# Patient Record
Sex: Male | Born: 1945 | Race: Black or African American | Hispanic: No | Marital: Married | Smoking: Former smoker
Health system: Southern US, Community
[De-identification: ages and names within clinical notes are randomized; demographics above are authoritative.]

## PROBLEM LIST (undated history)

## (undated) ENCOUNTER — Emergency Department (HOSPITAL_COMMUNITY): Discharge status: Absent without leave | Payer: Medicare Other

## (undated) DIAGNOSIS — D509 Iron deficiency anemia, unspecified: Secondary | ICD-10-CM

## (undated) DIAGNOSIS — R972 Elevated prostate specific antigen [PSA]: Secondary | ICD-10-CM

## (undated) DIAGNOSIS — E785 Hyperlipidemia, unspecified: Secondary | ICD-10-CM

## (undated) DIAGNOSIS — Z974 Presence of external hearing-aid: Secondary | ICD-10-CM

## (undated) DIAGNOSIS — G4733 Obstructive sleep apnea (adult) (pediatric): Secondary | ICD-10-CM

## (undated) DIAGNOSIS — Z978 Presence of other specified devices: Secondary | ICD-10-CM

## (undated) DIAGNOSIS — Z973 Presence of spectacles and contact lenses: Secondary | ICD-10-CM

## (undated) DIAGNOSIS — Z9989 Dependence on other enabling machines and devices: Secondary | ICD-10-CM

## (undated) DIAGNOSIS — N401 Enlarged prostate with lower urinary tract symptoms: Secondary | ICD-10-CM

## (undated) DIAGNOSIS — K5909 Other constipation: Secondary | ICD-10-CM

## (undated) DIAGNOSIS — Z87438 Personal history of other diseases of male genital organs: Secondary | ICD-10-CM

## (undated) DIAGNOSIS — R310 Gross hematuria: Secondary | ICD-10-CM

## (undated) DIAGNOSIS — H04123 Dry eye syndrome of bilateral lacrimal glands: Secondary | ICD-10-CM

## (undated) DIAGNOSIS — J45909 Unspecified asthma, uncomplicated: Secondary | ICD-10-CM

## (undated) DIAGNOSIS — K219 Gastro-esophageal reflux disease without esophagitis: Secondary | ICD-10-CM

## (undated) DIAGNOSIS — I10 Essential (primary) hypertension: Secondary | ICD-10-CM

## (undated) HISTORY — PX: KNEE ARTHROSCOPY W/ MENISCAL REPAIR: SHX1877

## (undated) HISTORY — DX: Hyperlipidemia, unspecified: E78.5

## (undated) HISTORY — DX: Essential (primary) hypertension: I10

## (undated) HISTORY — DX: Gastro-esophageal reflux disease without esophagitis: K21.9

## (undated) HISTORY — PX: PROSTATE SURGERY: SHX751

## (undated) HISTORY — DX: Unspecified asthma, uncomplicated: J45.909

---

## 2007-01-04 ENCOUNTER — Ambulatory Visit: Payer: Self-pay | Admitting: Gastroenterology

## 2007-10-01 ENCOUNTER — Ambulatory Visit: Payer: Self-pay | Admitting: Internal Medicine

## 2008-05-27 HISTORY — PX: ANKLE ARTHRODESIS: SUR49

## 2010-05-13 ENCOUNTER — Ambulatory Visit: Payer: Self-pay | Admitting: Oncology

## 2010-05-18 LAB — CBC WITH DIFFERENTIAL (CANCER CENTER ONLY)
BASO#: 0 10*3/uL (ref 0.0–0.2)
EOS%: 4.3 % (ref 0.0–7.0)
HCT: 30 % — ABNORMAL LOW (ref 38.7–49.9)
HGB: 10.1 g/dL — ABNORMAL LOW (ref 13.0–17.1)
MCH: 27.6 pg — ABNORMAL LOW (ref 28.0–33.4)
MCHC: 33.5 g/dL (ref 32.0–35.9)
MONO%: 9.6 % (ref 0.0–13.0)
NEUT%: 50.4 % (ref 40.0–80.0)
Platelets: 283 10*3/uL (ref 145–400)
RBC: 3.65 10*6/uL — ABNORMAL LOW (ref 4.20–5.70)
RDW: 13.4 % (ref 10.5–14.6)
WBC: 4 10*3/uL (ref 4.0–10.0)

## 2010-05-18 LAB — CMP (CANCER CENTER ONLY)
Calcium: 9.5 mg/dL (ref 8.0–10.3)
Sodium: 137 mEq/L (ref 128–145)
Total Protein: 7.2 g/dL (ref 6.4–8.1)

## 2010-05-18 LAB — MORPHOLOGY - CHCC SATELLITE

## 2010-05-24 LAB — PROTEIN ELECTROPHORESIS, SERUM
Albumin ELP: 56.5 % (ref 55.8–66.1)
Alpha-1-Globulin: 4 % (ref 2.9–4.9)
Alpha-2-Globulin: 7.8 % (ref 7.1–11.8)
Beta 2: 5.9 % (ref 3.2–6.5)
Beta Globulin: 6.6 % (ref 4.7–7.2)
Gamma Globulin: 19.2 % — ABNORMAL HIGH (ref 11.1–18.8)

## 2010-05-24 LAB — HAPTOGLOBIN: Haptoglobin: 141 mg/dL (ref 16–200)

## 2010-05-24 LAB — RETICULOCYTES (CHCC)
ABS Retic: 96.2 10*3/uL (ref 19.0–186.0)
Retic Ct Pct: 2.6 % (ref 0.4–3.1)

## 2010-05-24 LAB — HEMOGLOBINOPATHY EVALUATION
Hgb A2 Quant: 2.3 % (ref 2.2–3.2)
Hgb A: 97.7 % (ref 96.8–97.8)

## 2010-05-24 LAB — FOLATE: Folate: >20 ng/mL

## 2010-05-24 LAB — ERYTHROPOIETIN: Erythropoietin: 65.6 m[IU]/mL — ABNORMAL HIGH (ref 2.6–34.0)

## 2010-05-24 LAB — IRON AND TIBC
%SAT: 80 % — ABNORMAL HIGH (ref 20–55)
Iron: 330 ug/dL — ABNORMAL HIGH (ref 42–165)
TIBC: 415 ug/dL (ref 215–435)
UIBC: 85 ug/dL

## 2010-05-24 LAB — DIRECT ANTIGLOBULIN TEST (NOT AT ARMC): DAT IgG: NEGATIVE

## 2010-05-25 LAB — CBC WITH DIFFERENTIAL (CANCER CENTER ONLY)
BASO#: 0 10*3/uL (ref 0.0–0.2)
BASO%: 0.3 % (ref 0.0–2.0)
EOS%: 4.1 % (ref 0.0–7.0)
Eosinophils Absolute: 0.2 10*3/uL (ref 0.0–0.5)
LYMPH#: 1.2 10*3/uL (ref 0.9–3.3)
LYMPH%: 27.2 % (ref 14.0–48.0)
MCHC: 33.1 g/dL (ref 32.0–35.9)
MCV: 85 fL (ref 82–98)
MONO#: 0.4 10*3/uL (ref 0.1–0.9)
MONO%: 8.3 % (ref 0.0–13.0)
NEUT%: 60.1 % (ref 40.0–80.0)
Platelets: 229 10*3/uL (ref 145–400)

## 2010-06-03 ENCOUNTER — Ambulatory Visit (HOSPITAL_COMMUNITY): Admission: RE | Admit: 2010-06-03 | Discharge: 2010-06-03 | Payer: Self-pay | Admitting: Oncology

## 2010-07-01 ENCOUNTER — Ambulatory Visit: Payer: Self-pay | Admitting: Oncology

## 2010-07-06 LAB — CBC WITH DIFFERENTIAL/PLATELET
BASO%: 0.7 % (ref 0.0–2.0)
Basophils Absolute: 0 10*3/uL (ref 0.0–0.1)
Eosinophils Absolute: 0.1 10*3/uL (ref 0.0–0.5)
LYMPH%: 36.9 % (ref 14.0–49.0)
MCH: 30.5 pg (ref 27.2–33.4)
NEUT#: 1.7 10*3/uL (ref 1.5–6.5)
RBC: 4.08 10*6/uL — ABNORMAL LOW (ref 4.20–5.82)
lymph#: 1.3 10*3/uL (ref 0.9–3.3)

## 2010-08-10 ENCOUNTER — Ambulatory Visit: Payer: Self-pay | Admitting: Oncology

## 2010-08-12 LAB — CBC WITH DIFFERENTIAL (CANCER CENTER ONLY)
EOS%: 3.9 % (ref 0.0–7.0)
Eosinophils Absolute: 0.2 10*3/uL (ref 0.0–0.5)
LYMPH%: 37.3 % (ref 14.0–48.0)
MCH: 30 pg (ref 28.0–33.4)
NEUT#: 1.9 10*3/uL (ref 1.5–6.5)
NEUT%: 50.7 % (ref 40.0–80.0)
Platelets: 205 10*3/uL (ref 145–400)
RBC: 4.16 10*6/uL — ABNORMAL LOW (ref 4.20–5.70)
RDW: 13.6 % (ref 10.5–14.6)
WBC: 3.8 10*3/uL — ABNORMAL LOW (ref 4.0–10.0)

## 2010-08-12 LAB — IRON AND TIBC
Iron: 47 ug/dL (ref 42–165)
TIBC: 332 ug/dL (ref 215–435)
UIBC: 285 ug/dL

## 2010-12-15 ENCOUNTER — Ambulatory Visit: Payer: Self-pay | Admitting: Oncology

## 2010-12-22 ENCOUNTER — Ambulatory Visit: Payer: Self-pay | Admitting: Oncology

## 2011-01-16 ENCOUNTER — Emergency Department: Payer: Self-pay | Admitting: Emergency Medicine

## 2011-01-25 ENCOUNTER — Ambulatory Visit: Payer: Self-pay | Admitting: Oncology

## 2011-02-06 LAB — CBC
MCH: 29.4 pg (ref 26.0–34.0)
Platelets: 187 10*3/uL (ref 150–400)
WBC: 3.9 10*3/uL — ABNORMAL LOW (ref 4.0–10.5)

## 2011-02-06 LAB — APTT: aPTT: 30 seconds (ref 24–37)

## 2011-02-06 LAB — BONE MARROW EXAM

## 2011-02-06 LAB — PROTIME-INR: Prothrombin Time: 13.6 seconds (ref 11.6–15.2)

## 2011-02-20 ENCOUNTER — Ambulatory Visit: Payer: Self-pay | Admitting: Oncology

## 2012-10-23 ENCOUNTER — Ambulatory Visit: Payer: Self-pay | Admitting: Internal Medicine

## 2012-11-05 ENCOUNTER — Ambulatory Visit: Payer: Self-pay | Admitting: Internal Medicine

## 2012-11-05 LAB — CBC CANCER CENTER
Eosinophil: 4 %
HCT: 39.5 % — ABNORMAL LOW (ref 40.0–52.0)
HGB: 13.2 g/dL (ref 13.0–18.0)
MCH: 28.4 pg (ref 26.0–34.0)
MCHC: 33.3 g/dL (ref 32.0–36.0)
MCV: 85 fL (ref 80–100)
Monocytes: 14 %
RBC: 4.64 10*6/uL (ref 4.40–5.90)
Segmented Neutrophils: 55 %

## 2012-11-05 LAB — IRON AND TIBC: Iron Bind.Cap.(Total): 403 ug/dL (ref 250–450)

## 2012-11-05 LAB — FOLATE: Folic Acid: 18.7 ng/mL (ref 3.1–100.0)

## 2012-11-05 LAB — LACTATE DEHYDROGENASE: LDH: 219 U/L (ref 85–241)

## 2012-11-21 ENCOUNTER — Ambulatory Visit: Payer: Self-pay | Admitting: Internal Medicine

## 2012-12-22 ENCOUNTER — Ambulatory Visit: Payer: Self-pay | Admitting: Internal Medicine

## 2013-04-18 ENCOUNTER — Observation Stay: Payer: Self-pay | Admitting: Internal Medicine

## 2014-06-18 ENCOUNTER — Ambulatory Visit (INDEPENDENT_AMBULATORY_CARE_PROVIDER_SITE_OTHER): Payer: Medicare Other | Admitting: General Surgery

## 2014-06-18 ENCOUNTER — Encounter: Payer: Self-pay | Admitting: General Surgery

## 2014-06-18 VITALS — BP 122/70 | HR 66 | Resp 14 | Ht 73.0 in | Wt 235.0 lb

## 2014-06-18 DIAGNOSIS — L729 Follicular cyst of the skin and subcutaneous tissue, unspecified: Secondary | ICD-10-CM

## 2014-06-18 DIAGNOSIS — L723 Sebaceous cyst: Secondary | ICD-10-CM

## 2014-06-18 NOTE — Patient Instructions (Signed)
Patient advised to start and complete the Doxycycline. Patient to follow up after he completes his course of antibiotic. The patient is aware to call back for any questions or concerns.

## 2014-06-18 NOTE — Progress Notes (Signed)
Patient ID: Wesley Weber, male   DOB: [birth year], 68 y.o.   MRN: [id number]  Chief Complaint  Patient presents with  . Other    scalp sbscess    HPI Wesley FiddlerCharles M Weber is a 68 y.o. male here today for a scalp abscess. Patient states he noticed this area about 1-1.5 years ago. He states the area is open and draining. He states that the area will flare up and then subside. He is currently taking antibiotics for this. He does notice a little odor with drainage whiche he described as pasty  HPI  Past Medical History  Diagnosis Date  . Asthma   . Enlarged prostate   . Hypertension   . Hyperlipidemia   . GERD (gastroesophageal reflux disease)   . Prostatitis     Past Surgical History  Procedure Laterality Date  . Ankle Right 2010    History reviewed. No pertinent family history.  Social History History  Substance Use Topics  . Smoking status: Never Smoker   . Smokeless tobacco: Never Used  . Alcohol Use: No    Allergies  Allergen Reactions  . Ciprofloxacin Itching  . Omeprazole Rash    Current Outpatient Prescriptions  Medication Sig Dispense Refill  . amlodipine-atorvastatin (CADUET) 10-10 MG per tablet Take 1 tablet by mouth daily.      Marland Kitchen. amoxicillin-clavulanate (AUGMENTIN) 500-125 MG per tablet Take 1 tablet by mouth 3 (three) times daily.      Marland Kitchen. aspirin 81 MG tablet Take 81 mg by mouth daily.      . Cholecalciferol (D3 ADULT PO) Take 1 tablet by mouth daily.      . clindamycin (CLEOCIN) 300 MG capsule Take 300 mg by mouth 3 (three) times daily.      Marland Kitchen. doxycycline (DORYX) 100 MG EC tablet Take 100 mg by mouth daily.      . ferrous fumarate-iron polysaccharide complex (TANDEM) 162-115.2 MG CAPS Take 1 capsule by mouth daily with breakfast.      . fluticasone (FLONASE) 50 MCG/ACT nasal spray Place into both nostrils daily.      . montelukast (SINGULAIR) 10 MG tablet Take 10 mg by mouth at bedtime.      . tadalafil (CIALIS) 10 MG tablet Take 10 mg by mouth daily as  needed for erectile dysfunction.      . tamsulosin (FLOMAX) 0.4 MG CAPS capsule Take 0.4 mg by mouth.       No current facility-administered medications for this visit.    Review of Systems Review of Systems  Constitutional: Negative.   Respiratory: Negative.   Cardiovascular: Negative.     Blood pressure 122/70, pulse 66, resp. rate 14, height 6\' 1"  (1.854 m), weight 235 lb (106.595 kg).  Physical Exam Physical Exam  Constitutional: He is oriented to person, place, and time. He appears well-developed and well-nourished.  Neck: Neck supple. No thyromegaly present.  Cardiovascular: Normal rate, regular rhythm and normal heart sounds.   No murmur heard. Pulmonary/Chest: Effort normal and breath sounds normal.  Lymphadenopathy:    He has no cervical adenopathy.  Neurological: He is alert and oriented to person, place, and time.  Skin: Skin is warm and dry.  3-4 cm cystic mass located at posterior occipital area.   No redness or induratio of skin  Data Reviewed none  Assessment    Large symptomatoic cyst back of scalp     Plan    Patient to return in 2-3 weeks for excision of cyst on scalp. Complete  the current Rx of doxycycline.       Sofie Schendel G 06/18/2014, 2:50 PM

## 2014-07-07 ENCOUNTER — Encounter: Payer: Self-pay | Admitting: General Surgery

## 2014-07-07 ENCOUNTER — Ambulatory Visit (INDEPENDENT_AMBULATORY_CARE_PROVIDER_SITE_OTHER): Payer: Medicare Other | Admitting: General Surgery

## 2014-07-07 VITALS — BP 140/74 | HR 76 | Resp 14 | Ht 73.0 in | Wt 216.0 lb

## 2014-07-07 DIAGNOSIS — L729 Follicular cyst of the skin and subcutaneous tissue, unspecified: Secondary | ICD-10-CM

## 2014-07-07 DIAGNOSIS — L723 Sebaceous cyst: Secondary | ICD-10-CM

## 2014-07-07 NOTE — Patient Instructions (Addendum)
Patient to return in 1 week  Nurse visit for suture removal.

## 2014-07-07 NOTE — Progress Notes (Signed)
Patient ID: Wesley Weber, male   DOB: 1946-04-17, 68 y.o.   MRN: [id number]  Chief Complaint  Patient presents with  . Other    excision scalp cyst    HPI Wesley FiddlerCharles M Szczerba is a 68 y.o. male here today for excision scalp cyst.  HPI  Past Medical History  Diagnosis Date  . Asthma   . Enlarged prostate   . Hypertension   . Hyperlipidemia   . GERD (gastroesophageal reflux disease)   . Prostatitis     Past Surgical History  Procedure Laterality Date  . Ankle Right 2010    History reviewed. No pertinent family history.  Social History History  Substance Use Topics  . Smoking status: Never Smoker   . Smokeless tobacco: Never Used  . Alcohol Use: No    Allergies  Allergen Reactions  . Ciprofloxacin Itching  . Omeprazole Rash    Current Outpatient Prescriptions  Medication Sig Dispense Refill  . amlodipine-atorvastatin (CADUET) 10-10 MG per tablet Take 1 tablet by mouth daily.      Marland Kitchen. amoxicillin-clavulanate (AUGMENTIN) 500-125 MG per tablet Take 1 tablet by mouth 3 (three) times daily.      Marland Kitchen. aspirin 81 MG tablet Take 81 mg by mouth daily.      . Cholecalciferol (D3 ADULT PO) Take 1 tablet by mouth daily.      . clindamycin (CLEOCIN) 300 MG capsule Take 300 mg by mouth 3 (three) times daily.      Marland Kitchen. doxycycline (DORYX) 100 MG EC tablet Take 100 mg by mouth daily.      . ferrous fumarate-iron polysaccharide complex (TANDEM) 162-115.2 MG CAPS Take 1 capsule by mouth daily with breakfast.      . fluticasone (FLONASE) 50 MCG/ACT nasal spray Place into both nostrils daily.      . montelukast (SINGULAIR) 10 MG tablet Take 10 mg by mouth at bedtime.      . tadalafil (CIALIS) 10 MG tablet Take 10 mg by mouth daily as needed for erectile dysfunction.      . tamsulosin (FLOMAX) 0.4 MG CAPS capsule Take 0.4 mg by mouth.       No current facility-administered medications for this visit.    Review of Systems Review of Systems  Blood pressure 140/74, pulse 76, resp. rate 14,  height 6\' 1"  (1.854 m), weight 216 lb (97.977 kg).  Physical Exam Physical Exam  Data Reviewed    Assessment    Large skin cyst scalp     Plan          Procedure note  He is cyst was located in the posterior left occipital region. Proximally his 7 mL of 0.5% Marcaine mixed with 1% Xylocaine was instilled around the cyst and overlying skin. Area was prepped with ChloraPrep. Transverse skin incision was made the elliptical orientation. A small portion of skin along with the underlying cyst was completely excised out. Bleeding was controlled with disposable cautery. The excised cyst measured approximately 2.53 cm in size and was sent in formalin to pathology. The wound is fairly deep and closed in layers. The deep tissue closed with interrupted 3-0 Vicryl stitches. Skin closed with interrupted 4-0 nylon stitches. Neosporin ointment and dry gauze placed. Procedure was well-tolerated in no immediate problems encountered.   Jenet Durio G 07/09/2014, 8:03 AM

## 2014-07-09 ENCOUNTER — Encounter: Payer: Self-pay | Admitting: General Surgery

## 2014-07-09 LAB — PATHOLOGY

## 2014-07-14 ENCOUNTER — Ambulatory Visit (INDEPENDENT_AMBULATORY_CARE_PROVIDER_SITE_OTHER): Payer: Self-pay | Admitting: *Deleted

## 2014-07-14 DIAGNOSIS — L723 Sebaceous cyst: Secondary | ICD-10-CM

## 2014-07-14 DIAGNOSIS — L729 Follicular cyst of the skin and subcutaneous tissue, unspecified: Secondary | ICD-10-CM

## 2014-07-14 NOTE — Progress Notes (Signed)
Patient came in today for a wound check,removed sutures no steri strips applied.

## 2014-08-15 DIAGNOSIS — D649 Anemia, unspecified: Secondary | ICD-10-CM | POA: Insufficient documentation

## 2014-08-15 DIAGNOSIS — J45909 Unspecified asthma, uncomplicated: Secondary | ICD-10-CM | POA: Insufficient documentation

## 2015-01-09 ENCOUNTER — Emergency Department: Payer: Self-pay | Admitting: Emergency Medicine

## 2015-03-16 ENCOUNTER — Ambulatory Visit
Admit: 2015-03-16 | Disposition: A | Discharge status: Home / Self Care | Payer: Self-pay | Attending: Unknown Physician Specialty | Admitting: Unknown Physician Specialty

## 2015-03-18 LAB — SURGICAL PATHOLOGY

## 2017-11-08 ENCOUNTER — Other Ambulatory Visit: Payer: Self-pay

## 2017-11-29 ENCOUNTER — Encounter: Payer: Self-pay | Admitting: Urology

## 2017-11-29 ENCOUNTER — Ambulatory Visit (INDEPENDENT_AMBULATORY_CARE_PROVIDER_SITE_OTHER): Payer: Medicare Other | Admitting: Urology

## 2017-11-29 ENCOUNTER — Other Ambulatory Visit: Payer: Self-pay

## 2017-11-29 VITALS — BP 132/61 | HR 67 | Ht 72.0 in | Wt 235.0 lb

## 2017-11-29 DIAGNOSIS — R350 Frequency of micturition: Secondary | ICD-10-CM | POA: Diagnosis not present

## 2017-11-29 DIAGNOSIS — R972 Elevated prostate specific antigen [PSA]: Secondary | ICD-10-CM

## 2017-11-29 DIAGNOSIS — N281 Cyst of kidney, acquired: Secondary | ICD-10-CM | POA: Diagnosis not present

## 2017-11-29 LAB — BLADDER SCAN AMB NON-IMAGING

## 2017-11-29 NOTE — Progress Notes (Signed)
11/29/2017 8:53 AM   Wesley Weber 1946/01/31 [id number]  Referring provider: Sherron Monday, MD [address] [place], [place] [address]  Chief Complaint  Patient presents with  . Renal Cyst    New Patient    HPI: 72 year old male referred for multiple reasons to [facility] for further evaluation.  Patient reports that over the past 4 months, he is developed bilateral flank pain which is primarily positional.  He reports that he has sharp stabbing with occasional dull aching pain when he is leaning back in his lounge chair watching television.  This pain is also woken him up once at night lasting several minutes.  No associated urinary symptoms, gross hematuria, nausea or vomiting.  As part of his workup for his back pain, he underwent repeat noncontrast CT scan again on November 10, 2017 this time without contrast given his elevated creatinine of 1.6.  This revealed a cyst in the right lower pole, measuring 8.4 cm.  Again, I am unable to view imaging today and this is per report only.   He did also have a CT abd/ pelvis in 10/2016 demonstrating bilateral simple renal cysts, measuring up to 8 cm on the right side per the report.   In addition to this, he does have severe poorly controlled urinary symptoms.  He is currently on Flomax.  He does demonstrate adequate bladder emptying with minimal PVR today.  IPSS as below.  The patient reports today that he has been seeing a urologist for over 5 years.  He is followed by Dr. Ihor Gully at [facility].  Most recently, his PSA has risen from in the 1 range to around 7.  He saw Dr. Vernie Ammons just yesterday.  He is scheduled to undergo prostate biopsy at the end of the month for further evaluation of this.  It does not appear that his primary care is aware of the situation and he already has an established urologist.  He is quite pleased with Dr. Ammie Ferrier care.   IPSS    Row Name 11/29/17 0800         International Prostate Symptom Score   How often have you had the sensation of not emptying your bladder?  About half the time     How often have you had to urinate less than every two hours?  About half the time     How often have you found you stopped and started again several times when you urinated?  More than half the time     How often have you found it difficult to postpone urination?  About half the time     How often have you had a weak urinary stream?  More than half the time     How often have you had to strain to start urination?  About half the time     How many times did you typically get up at night to urinate?  1 Time     Total IPSS Score  21       Quality of Life due to urinary symptoms   If you were to spend the rest of your life with your urinary condition just the way it is now how would you feel about that?  Unhappy        Score:  1-7 Mild 8-19 Moderate 20-35 Severe    PMH: Past Medical History:  Diagnosis Date  . Asthma   . Enlarged prostate   . GERD (gastroesophageal reflux disease)   .  Hyperlipidemia   . Hypertension   . Prostatitis     Surgical History: Past Surgical History:  Procedure Laterality Date  . ankle Right 2010    Home Medications:  Allergies as of 11/29/2017      Reactions   Ciprofloxacin Itching   Omeprazole Rash      Medication List        Accurate as of 11/29/17  8:53 AM. Always use your most recent med list.          amoxicillin-clavulanate 500-125 MG tablet Commonly known as:  AUGMENTIN Take 1 tablet by mouth 3 (three) times daily.   aspirin 81 MG tablet Take 81 mg by mouth daily.   benazepril-hydrochlorthiazide 10-12.5 MG tablet Commonly known as:  LOTENSIN HCT   D3 ADULT PO Take 1 tablet by mouth daily.   ferrous fumarate-iron polysaccharide complex 162-115.2 MG Caps capsule Commonly known as:  TANDEM Take 1 capsule by mouth daily with breakfast.   pantoprazole 40 MG tablet Commonly known as:  PROTONIX     tadalafil 10 MG tablet Commonly known as:  CIALIS Take 10 mg by mouth daily as needed for erectile dysfunction.   tamsulosin 0.4 MG Caps capsule Commonly known as:  FLOMAX Take 0.4 mg by mouth.       Allergies:  Allergies  Allergen Reactions  . Ciprofloxacin Itching  . Omeprazole Rash    Family History: No family history on file.  Social History:  reports that  has never smoked. he has never used smokeless tobacco. He reports that he does not drink alcohol or use drugs.  ROS: UROLOGY Frequent Urination?: Yes Hard to postpone urination?: Yes Burning/pain with urination?: No Get up at night to urinate?: Yes Leakage of urine?: No Urine stream starts and stops?: No Trouble starting stream?: Yes Do you have to strain to urinate?: Yes Blood in urine?: No Urinary tract infection?: No Sexually transmitted disease?: No Injury to kidneys or bladder?: No Painful intercourse?: No Weak stream?: No Erection problems?: Yes Penile pain?: No  Gastrointestinal Nausea?: Yes Vomiting?: No Indigestion/heartburn?: Yes Diarrhea?: Yes Constipation?: Yes  Constitutional Fever: No Night sweats?: Yes Weight loss?: No Fatigue?: Yes  Skin Skin rash/lesions?: No Itching?: No  Eyes Blurred vision?: Yes Double vision?: No  Ears/Nose/Throat Sore throat?: No Sinus problems?: Yes  Hematologic/Lymphatic Swollen glands?: No Easy bruising?: No  Cardiovascular Leg swelling?: No Chest pain?: No  Respiratory Cough?: Yes Shortness of breath?: Yes  Endocrine Excessive thirst?: No  Musculoskeletal Back pain?: Yes Joint pain?: Yes  Neurological Headaches?: No Dizziness?: Yes  Psychologic Depression?: Yes Anxiety?: No  Physical Exam: BP 132/61   Pulse 67   Ht 6' (1.829 m)   Wt 235 lb (106.6 kg)   BMI 31.87 kg/m   Constitutional:  Alert and oriented, No acute distress. HEENT: [place] AT, moist mucus membranes.  Trachea midline, no masses. Cardiovascular: No  clubbing, cyanosis, or edema. Respiratory: Normal respiratory effort, no increased work of breathing. GI: Abdomen is soft, nontender, nondistended, no abdominal masses.  Obese.   GU: No CVA tenderness.  Skin: No rashes, bruises or suspicious lesions. Neurologic: Grossly intact, no focal deficits, moving all 4 extremities. Psychiatric: Normal mood and affect.  Laboratory Data: Creatinine presumably 1.6 per physician note, otherwise no labs forwarded to our office.  Urinalysis n/a  Pertinent Imaging: Results for orders placed or performed in visit on 11/29/17  BLADDER SCAN AMB NON-IMAGING  Result Value Ref Range   Scan Result    Assessment & Plan:  1. Bilateral renal cysts Unable to review reports today as a disk is not brought Review of CT scan report indicates cysts most consistent with simple renal cyst, measuring approximately 8 cm on the right Given that his pain only started a few months ago and the cysts are relatively stable in size, I feel it is unlikely that his pain is related to cyst Additionally, his pain is positional which is more consistent with musculoskeletal pain Would recommend observation only at this point in time We did briefly discuss possible interventions for symptomatic large renal cysts including percutaneous drainage versus laparoscopic cyst decortication Do not feel that he is a good candidate for either of these as I do not feel that this is likely the source of his pain He is agreeable to this plan He can continue to have a cyst followed by Dr. Vernie Ammonsttelin  2. Elevated PSA Scheduled for prostate biopsy later this month by Dr. Vernie Ammonsttelin Will defer all management to him as he Artie has an established urologist with whom he is comfortable  3. Urinary frequency Continue Flomax Adequate bladder emptying today Will defer further management to Dr. Vernie Ammonsttelin - BLADDER SCAN AMB NON-IMAGING  Happy to continue to see Mr. Andrey CampanileWilson if he desires a urologist  more locally, otherwise, he is comfortable following with Dr. Vernie Ammonsttelin with whom he has an established relationship.  Will defer further management and follow-up to him.  Vanna ScotlandAshley Alura Olveda, MD  [facility] [address], [address] [place], [place] [address] [phone]

## 2018-02-03 ENCOUNTER — Inpatient Hospital Stay
Admission: EM | Admit: 2018-02-03 | Discharge: 2018-02-06 | DRG: 726 | Disposition: A | Discharge status: Home / Self Care | Payer: Medicare Other | Attending: Family Medicine | Admitting: Family Medicine

## 2018-02-03 ENCOUNTER — Encounter: Payer: Self-pay | Admitting: Emergency Medicine

## 2018-02-03 ENCOUNTER — Other Ambulatory Visit: Payer: Self-pay

## 2018-02-03 DIAGNOSIS — Z791 Long term (current) use of non-steroidal anti-inflammatories (NSAID): Secondary | ICD-10-CM

## 2018-02-03 DIAGNOSIS — K625 Hemorrhage of anus and rectum: Secondary | ICD-10-CM | POA: Diagnosis present

## 2018-02-03 DIAGNOSIS — N401 Enlarged prostate with lower urinary tract symptoms: Secondary | ICD-10-CM | POA: Diagnosis not present

## 2018-02-03 DIAGNOSIS — N3289 Other specified disorders of bladder: Secondary | ICD-10-CM | POA: Diagnosis present

## 2018-02-03 DIAGNOSIS — R338 Other retention of urine: Secondary | ICD-10-CM | POA: Diagnosis present

## 2018-02-03 DIAGNOSIS — R339 Retention of urine, unspecified: Secondary | ICD-10-CM | POA: Diagnosis present

## 2018-02-03 DIAGNOSIS — Z6832 Body mass index (BMI) 32.0-32.9, adult: Secondary | ICD-10-CM

## 2018-02-03 DIAGNOSIS — E669 Obesity, unspecified: Secondary | ICD-10-CM | POA: Diagnosis present

## 2018-02-03 DIAGNOSIS — K649 Unspecified hemorrhoids: Secondary | ICD-10-CM | POA: Diagnosis present

## 2018-02-03 DIAGNOSIS — J45909 Unspecified asthma, uncomplicated: Secondary | ICD-10-CM | POA: Diagnosis present

## 2018-02-03 DIAGNOSIS — K219 Gastro-esophageal reflux disease without esophagitis: Secondary | ICD-10-CM | POA: Diagnosis present

## 2018-02-03 DIAGNOSIS — I10 Essential (primary) hypertension: Secondary | ICD-10-CM | POA: Diagnosis present

## 2018-02-03 DIAGNOSIS — R310 Gross hematuria: Secondary | ICD-10-CM | POA: Diagnosis present

## 2018-02-03 DIAGNOSIS — Z7982 Long term (current) use of aspirin: Secondary | ICD-10-CM

## 2018-02-03 LAB — URINALYSIS, COMPLETE (UACMP) WITH MICROSCOPIC
SPECIFIC GRAVITY, URINE: 1.01 (ref 1.005–1.030)
Squamous Epithelial / LPF: NONE SEEN

## 2018-02-03 NOTE — ED Triage Notes (Signed)
Pt arrives ambulatory to triage with c/o urinary retention x 2 hours. Pt's bladder scan is > 999 ml at this time. Pt reports hx of prostate issues.

## 2018-02-04 ENCOUNTER — Other Ambulatory Visit: Payer: Self-pay

## 2018-02-04 DIAGNOSIS — R339 Retention of urine, unspecified: Secondary | ICD-10-CM | POA: Diagnosis present

## 2018-02-04 DIAGNOSIS — K625 Hemorrhage of anus and rectum: Secondary | ICD-10-CM | POA: Diagnosis not present

## 2018-02-04 DIAGNOSIS — J45909 Unspecified asthma, uncomplicated: Secondary | ICD-10-CM | POA: Diagnosis present

## 2018-02-04 DIAGNOSIS — R338 Other retention of urine: Secondary | ICD-10-CM | POA: Diagnosis present

## 2018-02-04 DIAGNOSIS — E669 Obesity, unspecified: Secondary | ICD-10-CM | POA: Diagnosis present

## 2018-02-04 DIAGNOSIS — Z7982 Long term (current) use of aspirin: Secondary | ICD-10-CM | POA: Diagnosis not present

## 2018-02-04 DIAGNOSIS — Z791 Long term (current) use of non-steroidal anti-inflammatories (NSAID): Secondary | ICD-10-CM | POA: Diagnosis not present

## 2018-02-04 DIAGNOSIS — Z6832 Body mass index (BMI) 32.0-32.9, adult: Secondary | ICD-10-CM | POA: Diagnosis not present

## 2018-02-04 DIAGNOSIS — R310 Gross hematuria: Secondary | ICD-10-CM | POA: Diagnosis present

## 2018-02-04 DIAGNOSIS — N401 Enlarged prostate with lower urinary tract symptoms: Secondary | ICD-10-CM | POA: Diagnosis present

## 2018-02-04 DIAGNOSIS — N3289 Other specified disorders of bladder: Secondary | ICD-10-CM | POA: Diagnosis present

## 2018-02-04 DIAGNOSIS — K219 Gastro-esophageal reflux disease without esophagitis: Secondary | ICD-10-CM | POA: Diagnosis present

## 2018-02-04 DIAGNOSIS — I10 Essential (primary) hypertension: Secondary | ICD-10-CM | POA: Diagnosis present

## 2018-02-04 DIAGNOSIS — K649 Unspecified hemorrhoids: Secondary | ICD-10-CM | POA: Diagnosis present

## 2018-02-04 DIAGNOSIS — N400 Enlarged prostate without lower urinary tract symptoms: Secondary | ICD-10-CM

## 2018-02-04 LAB — BASIC METABOLIC PANEL
Anion gap: 7 (ref 5–15)
BUN: 18 mg/dL (ref 6–20)
CHLORIDE: 110 mmol/L (ref 101–111)
CO2: 25 mmol/L (ref 22–32)
Calcium: 9.5 mg/dL (ref 8.9–10.3)
Creatinine, Ser: 1.18 mg/dL (ref 0.61–1.24)
GFR calc Af Amer: >60 mL/min (ref >60)
GFR, EST NON AFRICAN AMERICAN: 59 mL/min — AB (ref >60)
GLUCOSE: 124 mg/dL — AB (ref 65–99)
POTASSIUM: 4.6 mmol/L (ref 3.5–5.1)
Sodium: 142 mmol/L (ref 135–145)

## 2018-02-04 LAB — CBC WITH DIFFERENTIAL/PLATELET
Basophils Absolute: 0 10*3/uL (ref 0–0.1)
Basophils Relative: 1 %
EOS PCT: 0 %
Eosinophils Absolute: 0 10*3/uL (ref 0–0.7)
HEMATOCRIT: 41.8 % (ref 40.0–52.0)
Hemoglobin: 14.1 g/dL (ref 13.0–18.0)
LYMPHS ABS: 0.7 10*3/uL — AB (ref 1.0–3.6)
LYMPHS PCT: 10 %
MCH: 29.9 pg (ref 26.0–34.0)
MCHC: 33.7 g/dL (ref 32.0–36.0)
MCV: 88.8 fL (ref 80.0–100.0)
MONO ABS: 0.4 10*3/uL (ref 0.2–1.0)
MONOS PCT: 6 %
Neutro Abs: 5.7 10*3/uL (ref 1.4–6.5)
Neutrophils Relative %: 83 %
PLATELETS: 184 10*3/uL (ref 150–440)
RBC: 4.71 MIL/uL (ref 4.40–5.90)
RDW: 14.6 % — AB (ref 11.5–14.5)
WBC: 6.9 10*3/uL (ref 3.8–10.6)

## 2018-02-04 MED ORDER — SENNOSIDES-DOCUSATE SODIUM 8.6-50 MG PO TABS
1.0000 | ORAL_TABLET | Freq: Every evening | ORAL | Status: DC | PRN
  Administered 2018-02-04: 23:00:00 1 via ORAL
  Filled 2018-02-04: qty 1

## 2018-02-04 MED ORDER — FERROUS FUMARATE 324 (106 FE) MG PO TABS
1.0000 | ORAL_TABLET | Freq: Every day | ORAL | Status: DC
  Administered 2018-02-05 – 2018-02-06 (×2): 106 mg via ORAL
  Filled 2018-02-04 (×2): qty 1

## 2018-02-04 MED ORDER — PANTOPRAZOLE SODIUM 40 MG PO TBEC
40.0000 mg | DELAYED_RELEASE_TABLET | Freq: Every day | ORAL | Status: DC
  Administered 2018-02-04 – 2018-02-05 (×2): 40 mg via ORAL
  Filled 2018-02-04 (×3): qty 1

## 2018-02-04 MED ORDER — FLEET ENEMA 7-19 GM/118ML RE ENEM
1.0000 | ENEMA | Freq: Once | RECTAL | Status: AC | PRN
  Administered 2018-02-05: 07:00:00 1 via RECTAL

## 2018-02-04 MED ORDER — ONDANSETRON HCL 4 MG PO TABS: 4.0000 mg | ORAL_TABLET | Freq: Four times a day (QID) | ORAL | Status: DC | PRN

## 2018-02-04 MED ORDER — ONDANSETRON HCL 4 MG/2ML IJ SOLN: 4.0000 mg | Freq: Four times a day (QID) | INTRAMUSCULAR | Status: DC | PRN

## 2018-02-04 MED ORDER — BENAZEPRIL HCL 20 MG PO TABS
40.0000 mg | ORAL_TABLET | Freq: Every day | ORAL | Status: DC
  Administered 2018-02-04 – 2018-02-06 (×3): 40 mg via ORAL
  Filled 2018-02-04: qty 1
  Filled 2018-02-04 (×2): qty 2

## 2018-02-04 MED ORDER — CHOLECALCIFEROL 10 MCG (400 UNIT) PO TABS
ORAL_TABLET | Freq: Every day | ORAL | Status: DC
  Filled 2018-02-04: qty 2

## 2018-02-04 MED ORDER — ACETAMINOPHEN 325 MG PO TABS: 650.0000 mg | ORAL_TABLET | Freq: Four times a day (QID) | ORAL | Status: DC | PRN

## 2018-02-04 MED ORDER — ACETAMINOPHEN 650 MG RE SUPP: 650.0000 mg | Freq: Four times a day (QID) | RECTAL | Status: DC | PRN

## 2018-02-04 MED ORDER — POLYVINYL ALCOHOL 1.4 % OP SOLN
1.0000 [drp] | OPHTHALMIC | Status: DC | PRN
  Filled 2018-02-04: qty 15

## 2018-02-04 MED ORDER — LIDOCAINE HCL 2 % EX GEL
1.0000 "application " | Freq: Once | CUTANEOUS | Status: AC
  Administered 2018-02-04: 1 via URETHRAL

## 2018-02-04 MED ORDER — PANTOPRAZOLE SODIUM 20 MG PO TBEC
20.0000 mg | DELAYED_RELEASE_TABLET | Freq: Every day | ORAL | Status: DC
  Filled 2018-02-04: qty 1

## 2018-02-04 MED ORDER — TAMSULOSIN HCL 0.4 MG PO CAPS
0.4000 mg | ORAL_CAPSULE | Freq: Every day | ORAL | Status: DC
  Administered 2018-02-04 – 2018-02-06 (×3): 0.4 mg via ORAL
  Filled 2018-02-04 (×4): qty 1

## 2018-02-04 MED ORDER — ALBUTEROL SULFATE (2.5 MG/3ML) 0.083% IN NEBU
2.5000 mg | INHALATION_SOLUTION | RESPIRATORY_TRACT | Status: DC | PRN
  Administered 2018-02-04 – 2018-02-05 (×3): 2.5 mg via RESPIRATORY_TRACT
  Filled 2018-02-04 (×3): qty 3

## 2018-02-04 MED ORDER — OXYBUTYNIN CHLORIDE 5 MG PO TABS
5.0000 mg | ORAL_TABLET | Freq: Three times a day (TID) | ORAL | Status: DC | PRN
  Administered 2018-02-05: 5 mg via ORAL
  Filled 2018-02-04: qty 1

## 2018-02-04 MED ORDER — BISACODYL 10 MG RE SUPP
10.0000 mg | Freq: Every day | RECTAL | Status: DC | PRN
  Filled 2018-02-04: qty 1

## 2018-02-04 MED ORDER — LACTATED RINGERS IV SOLN
INTRAVENOUS | Status: DC
  Administered 2018-02-04 – 2018-02-06 (×4): via INTRAVENOUS

## 2018-02-04 MED ORDER — VITAMIN D 1000 UNITS PO TABS
1000.0000 [IU] | ORAL_TABLET | Freq: Every day | ORAL | Status: DC
  Administered 2018-02-04 – 2018-02-06 (×3): 1000 [IU] via ORAL
  Filled 2018-02-04 (×5): qty 1

## 2018-02-04 MED ORDER — HYDROCODONE-ACETAMINOPHEN 5-325 MG PO TABS
1.0000 | ORAL_TABLET | ORAL | Status: DC | PRN
Start: 2018-02-04 — End: 2018-02-06
  Administered 2018-02-04 – 2018-02-06 (×5): 2 via ORAL
  Filled 2018-02-04 (×5): qty 2

## 2018-02-04 MED ORDER — SODIUM CHLORIDE 0.9% FLUSH
3.0000 mL | Freq: Two times a day (BID) | INTRAVENOUS | Status: DC
  Administered 2018-02-04 – 2018-02-05 (×2): 3 mL via INTRAVENOUS

## 2018-02-04 MED ORDER — LIDOCAINE HCL 2 % EX GEL
CUTANEOUS | Status: AC
  Administered 2018-02-04: 1 via URETHRAL
  Filled 2018-02-04: qty 10

## 2018-02-04 MED ORDER — POLYVINYL ALCOHOL 1.4 % OP SOLN
Freq: Four times a day (QID) | OPHTHALMIC | Status: DC | PRN
  Filled 2018-02-04: qty 15

## 2018-02-04 NOTE — ED Provider Notes (Signed)
[facility] Emergency Department Provider Note   ____________________________________________   I have reviewed the triage vital signs and the nursing notes.   HISTORY  Chief Complaint Urinary Retention   History limited by: Not Limited   HPI Wesley Weber is a 72 y.o. male who presents to the emergency department today because of concerns for urinary retention.  Patient states that he last was able to get some urine out a few hours ago.  Although at that time he did not feel like he had incomplete void.  He then could not void any further.  This was accompanied by severe lower abdominal pain.  Patient denies any recent dysuria or bad odor to his urine.  He denies any recent fevers.  States he does have a history of enlarged prostate.  Does follow-up with urology.     Per medical record review patient has a history of enlarged prostate.   Past Medical History:  Diagnosis Date  . Asthma   . Enlarged prostate   . GERD (gastroesophageal reflux disease)   . Hyperlipidemia   . Hypertension   . Prostatitis     There are no active problems to display for this patient.   Past Surgical History:  Procedure Laterality Date  . ankle Right 2010    Prior to Admission medications   Medication Sig Start Date End Date Taking? Authorizing Provider  aspirin 81 MG tablet Take 81 mg by mouth daily.   Yes [provider]  benazepril (LOTENSIN) 40 MG tablet Take 1 tablet by mouth daily. 01/30/18  Yes [provider]  Cholecalciferol (D3 ADULT PO) Take 1 tablet by mouth daily.   Yes [provider]  ferrous fumarate-iron polysaccharide complex (TANDEM) 162-115.2 MG CAPS Take 1 capsule by mouth daily with breakfast.   Yes [provider]  meloxicam (MOBIC) 15 MG tablet Take 15 mg by mouth daily. 01/25/18  Yes [provider]  pantoprazole (PROTONIX) 20 MG tablet Take 20 mg by mouth daily. 12/20/17  Yes [provider]  tadalafil (CIALIS) 10 MG tablet Take 10 mg by mouth daily as needed for erectile dysfunction.   Yes [provider]  tamsulosin (FLOMAX) 0.4 MG CAPS capsule Take 0.4 mg by mouth daily.    Yes [provider]  THERATEARS 1 % GEL  01/25/18  Yes [provider]    Allergies Ciprofloxacin and Omeprazole  No family history on file.  Social History Social History   Tobacco Use  . Smoking status: Never Smoker  . Smokeless tobacco: Never Used  Substance Use Topics  . Alcohol use: No  . Drug use: No    Review of Systems Constitutional: No fever/chills Eyes: No visual changes. ENT: No sore throat. Cardiovascular: Denies chest pain. Respiratory: Denies shortness of breath. Gastrointestinal: Positive for lower abdominal pain. Genitourinary: Negative for dysuria. Positive for urinary retention. Musculoskeletal: Negative for back pain. Skin: Negative for rash. Neurological: Negative for headaches, focal weakness or numbness.  ____________________________________________   PHYSICAL EXAM:  VITAL SIGNS: ED Triage Vitals  Enc Vitals Group     BP 02/03/18 2232 (!) 181/80     Pulse Rate 02/03/18 2232 81     Resp 02/03/18 2232 16     Temp 02/03/18 2232 98.6 F (37 C)     Temp Source 02/03/18 2232 Oral     SpO2 02/03/18 2232 98 %     Weight 02/03/18 2230 235 lb (106.6 kg)     Height 02/03/18  2230 6\' 2"  (1.88 m)     Head Circumference --      Peak Flow --      Pain Score 02/03/18 2229 9    Constitutional: Alert and oriented. Well appearing and in no distress. Eyes: Conjunctivae are normal.  ENT   Head: Normocephalic and atraumatic.   Nose: No congestion/rhinnorhea.   Mouth/Throat: Mucous membranes are moist.   Neck: No stridor. Hematological/Lymphatic/Immunilogical: No cervical lymphadenopathy. Cardiovascular: Normal rate, regular rhythm.  No murmurs, rubs, or gallops. Respiratory: Normal respiratory effort without tachypnea nor  retractions. Breath sounds are clear and equal bilaterally. No wheezes/rales/rhonchi. Gastrointestinal: Soft and non tender. No rebound. No guarding.  Genitourinary: Deferred Musculoskeletal: Normal range of motion in all extremities. No lower extremity edema. Neurologic:  Normal speech and language. No gross focal neurologic deficits are appreciated.  Skin:  Skin is warm, dry and intact. No rash noted. Psychiatric: Mood and affect are normal. Speech and behavior are normal. Patient exhibits appropriate insight and judgment.  ____________________________________________    LABS (pertinent positives/negatives)  UA not consistent with infection  ____________________________________________   EKG  None  ____________________________________________    RADIOLOGY  None  ____________________________________________   PROCEDURES  Procedures  ____________________________________________   INITIAL IMPRESSION / ASSESSMENT AND PLAN / ED COURSE  Pertinent labs & imaging results that were available during my care of the patient were reviewed by me and considered in my medical decision making (see chart for details).  Patient presented to the emergency department today with concerns for lower abdominal pain and urinary retention.  Patient does have a history of enlarged prostate.  Patient did look initially uncomfortable however after Foley catheter was placed abdomen was benign.  He did feel immediate relief.  Some blood after initial Foley catheter attempt.  However with daily catheter majority of the urine appeared clear.  UA not consistent with infection.  Will keep Foley catheter in place and have patient follow-up with urology. Discussed plan with patient.  ____________________________________________   FINAL CLINICAL IMPRESSION(S) / ED DIAGNOSES  Final diagnoses:  Urinary retention     Note: This dictation was prepared with Dragon dictation. Any transcriptional errors  that result from this process are unintentional     Phineas SemenGoodman, Horton Ellithorpe, MD 02/04/18 [phone]

## 2018-02-04 NOTE — H&P (Signed)
[facility] - [place] [facility]   PATIENT NAME: Wesley Weber    MR#:  [id number]  DATE OF BIRTH:  05/27/1946  DATE OF ADMISSION:  02/03/2018  PRIMARY CARE PHYSICIAN: Sherron Mondayejan-Sie, S Ahmed, MD   REQUESTING/REFERRING PHYSICIAN:   CHIEF COMPLAINT:   Chief Complaint  Patient presents with  . Urinary Retention    HISTORY OF PRESENT ILLNESS: Wesley LatherCharles Paragas  is a 72 y.o. male with a known history per below, status post recent prostate biopsy 3-4 weeks ago which was negative by urology Dr. Vernie Ammonsttelin, presents to the emergency room with inability to void completely, Foley was placed, resulting in hematuria with clots-catheter became clotted and urinary catheter had to be replaced, currently receiving continuous bladder irrigation, hospitalist call for further evaluation/care, patient in no apparent distress, wife at the bedside, noted gross hematuria within catheter system, patient denies any lower abdominal pain currently, patient is now being admitted for acute urinary retention with gross hematuria.  PAST MEDICAL HISTORY:   Past Medical History:  Diagnosis Date  . Asthma   . Enlarged prostate   . GERD (gastroesophageal reflux disease)   . Hyperlipidemia   . Hypertension   . Prostatitis     PAST SURGICAL HISTORY:  Past Surgical History:  Procedure Laterality Date  . ankle Right 2010    SOCIAL HISTORY:  Social History   Tobacco Use  . Smoking status: Never Smoker  . Smokeless tobacco: Never Used  Substance Use Topics  . Alcohol use: No    FAMILY HISTORY: No family history on file.  DRUG ALLERGIES:  Allergies  Allergen Reactions  . Ciprofloxacin Itching  . Omeprazole Rash    REVIEW OF SYSTEMS:   CONSTITUTIONAL: No fever, fatigue or weakness.  EYES: No blurred or double vision.  EARS, NOSE, AND THROAT: No tinnitus or ear pain.  RESPIRATORY: No cough, shortness of breath, wheezing or hemoptysis.  CARDIOVASCULAR: No chest pain, orthopnea, edema.   GASTROINTESTINAL: No nausea, vomiting, diarrhea, + abdominal pain.  GENITOURINARY: No dysuria, +hematuria.  Inability to void ENDOCRINE: No polyuria, nocturia,  HEMATOLOGY: No anemia, easy bruising or bleeding SKIN: No rash or lesion. MUSCULOSKELETAL: No joint pain or arthritis.   NEUROLOGIC: No tingling, numbness, weakness.  PSYCHIATRY: No anxiety or depression.   MEDICATIONS AT HOME:  Prior to Admission medications   Medication Sig Start Date End Date Taking? Authorizing Provider  aspirin 81 MG tablet Take 81 mg by mouth daily.   Yes [provider]  benazepril (LOTENSIN) 40 MG tablet Take 1 tablet by mouth daily. 01/30/18  Yes [provider]  Cholecalciferol (D3 ADULT PO) Take 1 tablet by mouth daily.   Yes [provider]  ferrous fumarate-iron polysaccharide complex (TANDEM) 162-115.2 MG CAPS Take 1 capsule by mouth daily with breakfast.   Yes [provider]  meloxicam (MOBIC) 15 MG tablet Take 15 mg by mouth daily. 01/25/18  Yes [provider]  pantoprazole (PROTONIX) 20 MG tablet Take 20 mg by mouth daily. 12/20/17  Yes [provider]  tadalafil (CIALIS) 10 MG tablet Take 10 mg by mouth daily as needed for erectile dysfunction.   Yes [provider]  tamsulosin (FLOMAX) 0.4 MG CAPS capsule Take 0.4 mg by mouth daily.    Yes [provider]  THERATEARS 1 % GEL  01/25/18  Yes [provider]      PHYSICAL EXAMINATION:   VITAL SIGNS: Blood pressure (!) 168/82, pulse (!) 58, temperature 98.6 F (37 C), temperature source  Oral, resp. rate 17, height 6\' 2"  (1.88 m), weight 106.6 kg (235 lb), SpO2 99 %.  GENERAL:  72 y.o.-year-old patient lying in the bed with no acute distress.  Obese, nontoxic-appearing EYES: Pupils equal, round, reactive to light and accommodation. No scleral icterus. Extraocular muscles intact.  HEENT: Head atraumatic, normocephalic. Oropharynx and nasopharynx clear.  NECK:  Supple,  no jugular venous distention. No thyroid enlargement, no tenderness.  LUNGS: Normal breath sounds bilaterally, no wheezing, rales,rhonchi or crepitation. No use of accessory muscles of respiration.  CARDIOVASCULAR: S1, S2 normal. No murmurs, rubs, or gallops.  ABDOMEN: Soft, nontender, nondistended. Bowel sounds present. No organomegaly or mass.  Gross hematuria within catheter system EXTREMITIES: No pedal edema, cyanosis, or clubbing.  NEUROLOGIC: Cranial nerves II through XII are intact. MAES. Gait not checked.  PSYCHIATRIC: The patient is alert and oriented x 3.  SKIN: No obvious rash, lesion, or ulcer.   LABORATORY PANEL:   CBC Recent Labs  Lab 02/04/18 0522  WBC 6.9  HGB 14.1  HCT 41.8  PLT 184  MCV 88.8  MCH 29.9  MCHC 33.7  RDW 14.6*  LYMPHSABS 0.7*  MONOABS 0.4  EOSABS 0.0  BASOSABS 0.0   ------------------------------------------------------------------------------------------------------------------  Chemistries  Recent Labs  Lab 02/04/18 0522  NA 142  K 4.6  CL 110  CO2 25  GLUCOSE 124*  BUN 18  CREATININE 1.18  CALCIUM 9.5   ------------------------------------------------------------------------------------------------------------------ estimated creatinine clearance is 72.6 mL/min (by C-G formula based on SCr of 1.18 mg/dL). ------------------------------------------------------------------------------------------------------------------ No results for input(s): TSH, T4TOTAL, T3FREE, THYROIDAB in the last 72 hours.  Invalid input(s): FREET3   Coagulation profile No results for input(s): INR, PROTIME in the last 168 hours. ------------------------------------------------------------------------------------------------------------------- No results for input(s): DDIMER in the last 72 hours. -------------------------------------------------------------------------------------------------------------------  Cardiac Enzymes No results for input(s):  CKMB, TROPONINI, MYOGLOBIN in the last 168 hours.  Invalid input(s): CK ------------------------------------------------------------------------------------------------------------------ Invalid input(s): POCBNP  ---------------------------------------------------------------------------------------------------------------  Urinalysis    Component Value Date/Time   COLORURINE RED (A) 02/03/2018 2234   APPEARANCEUR CLOUDY (A) 02/03/2018 2234   LABSPEC 1.010 02/03/2018 2234   PHURINE  02/03/2018 2234    TEST NOT REPORTED DUE TO COLOR INTERFERENCE OF URINE PIGMENT   GLUCOSEU (A) 02/03/2018 2234    TEST NOT REPORTED DUE TO COLOR INTERFERENCE OF URINE PIGMENT   HGBUR (A) 02/03/2018 2234    TEST NOT REPORTED DUE TO COLOR INTERFERENCE OF URINE PIGMENT   BILIRUBINUR (A) 02/03/2018 2234    TEST NOT REPORTED DUE TO COLOR INTERFERENCE OF URINE PIGMENT   KETONESUR (A) 02/03/2018 2234    TEST NOT REPORTED DUE TO COLOR INTERFERENCE OF URINE PIGMENT   PROTEINUR (A) 02/03/2018 2234    TEST NOT REPORTED DUE TO COLOR INTERFERENCE OF URINE PIGMENT   NITRITE (A) 02/03/2018 2234    TEST NOT REPORTED DUE TO COLOR INTERFERENCE OF URINE PIGMENT   LEUKOCYTESUR (A) 02/03/2018 2234    TEST NOT REPORTED DUE TO COLOR INTERFERENCE OF URINE PIGMENT     RADIOLOGY: No results found.  EKG: No orders found for this or any previous visit.  IMPRESSION AND PLAN: 1 acute urinary retention with gross hematuria Status post prostate biopsy 3-4 weeks ago by Ihor Gully, MD/urology-negative for cancer per patient, history of BPH Continue Foley catheter, Flomax, continuous bladder irrigation, CBC daily, consult urology for expert opinion, avoid antiplatelet agents/anticoagulants/NSAID medications  2 acute gross hematuria Plan of care as stated above  3 history of asthma without exacerbation Stable Breathing treatments as needed  4 chronic GERD without esophagitis Stable PPI daily  5 chronic obesity Most  likely secondary to excess calories Lifestyle modification recommended    All the records are reviewed and case discussed with ED provider. Management plans discussed with the patient, family and they are in agreement.  CODE STATUS:full Code Status History    This patient does not have a recorded code status. Please follow your organizational policy for patients in this situation.       TOTAL TIME TAKING CARE OF THIS PATIENT: 45 minutes.    Evelena Asa Salary M.D on 02/04/2018   Between 7am to 6pm - Pager - [phone]  After 6pm go to [email or web address] - password [email or web address]  [facility]  Office  [phone]  CC: Primary care physician; Sherron Monday, MD   Note: This dictation was prepared with Dragon dictation along with smaller phrase technology. Any transcriptional errors that result from this process are unintentional.

## 2018-02-04 NOTE — ED Notes (Signed)
7522fr coude catheter inserted.  Immediate return of cherry red urine with medium sized clots.  Pt reporting immediate relief once catheter inserted.  0.9% NS CBI running well.

## 2018-02-04 NOTE — Consult Note (Addendum)
Consultation: Urinary retention, gross hematuria Requested by: Dr. Judie Petit Salary  History of Present Illness: 72 year old male patient of Dr. Vernie Ammons who presented with inability to void.  Bladder scan greater than 1000 and a Foley catheter was placed.  He developed gross hematuria in his Foley was removed and replaced with a 22 [place] three-way catheter.  CBI was started in the emergency department and the urine cleared.  CBI was stopped.  Because of the need for CBI he was admitted for observation.  Currently urine is clear and the patient is doing well.  He has a history of BPH and about 160 g on recent ultrasound biopsy.  He is on tamsulosin and was on finasteride but stopped ago.  PSA was around 7.5 and a biopsy was negative December 19, 2017.  Past Medical History:  Diagnosis Date  . Asthma   . Enlarged prostate   . GERD (gastroesophageal reflux disease)   . Hyperlipidemia   . Hypertension   . Prostatitis    Past Surgical History:  Procedure Laterality Date  . ankle Right 2010    Home Medications:  Medications Prior to Admission  Medication Sig Dispense Refill Last Dose  . albuterol (PROVENTIL HFA;VENTOLIN HFA) 108 (90 Base) MCG/ACT inhaler Inhale 2 puffs into the lungs every 4 (four) hours as needed for wheezing or shortness of breath.     Marland Kitchen aspirin 81 MG tablet Take 81 mg by mouth daily.   02/03/2018 at Unknown time  . benazepril (LOTENSIN) 40 MG tablet Take 1 tablet by mouth daily.  1 02/03/2018 at Unknown time  . Cholecalciferol (D3 ADULT PO) Take 1 tablet by mouth daily.   02/03/2018 at Unknown time  . ferrous fumarate-iron polysaccharide complex (TANDEM) 162-115.2 MG CAPS Take 1 capsule by mouth daily with breakfast.   02/03/2018 at Unknown time  . meloxicam (MOBIC) 15 MG tablet Take 15 mg by mouth daily.   02/03/2018 at Unknown time  . pantoprazole (PROTONIX) 20 MG tablet Take 20 mg by mouth daily.   02/03/2018 at Unknown time  . tadalafil (CIALIS) 10 MG tablet Take 10 mg by mouth  daily as needed for erectile dysfunction.   prn at prn  . tamsulosin (FLOMAX) 0.4 MG CAPS capsule Take 0.4 mg by mouth daily.    02/03/2018 at Unknown time  . THERATEARS 1 % GEL    02/03/2018 at Unknown time   Allergies:  Allergies  Allergen Reactions  . Ciprofloxacin Itching  . Omeprazole Rash    History reviewed. No pertinent family history. Social History:  reports that  has never smoked. he has never used smokeless tobacco. He reports that he does not drink alcohol or use drugs.  ROS: A complete review of systems was performed.  All systems are negative except for pertinent findings as noted. ROS   Physical Exam:  Vital signs in last 24 hours: Temp:  [98.5 F (36.9 C)-99.2 F (37.3 C)] 99.2 F (37.3 C) (03/17 1252) Pulse Rate:  [44-81] 69 (03/17 1252) Resp:  [16-19] 18 (03/17 1252) BP: (114-181)/(54-88) 139/67 (03/17 1252) SpO2:  [96 %-100 %] 98 % (03/17 1252) Weight:  [106.6 kg (235 lb)-110.1 kg (242 lb 12.8 oz)] 110.1 kg (242 lb 12.8 oz) (03/17 0930) General:  Alert and oriented, No acute distress HEENT: Normocephalic, atraumatic Cardiovascular: Regular rate and rhythm Lungs: Regular rate and effort Abdomen: Soft, nontender, nondistended, no abdominal masses Back: No CVA tenderness Extremities: No edema Neurologic: Grossly intact GU: 22 French three-way catheter in place, urine clear,  catheter appears to be inserted sufficiently and properly placed.  Laboratory Data:  Results for orders placed or performed during the hospital encounter of 02/03/18 (from the past 24 hour(s))  Urinalysis, Complete w Microscopic     Status: Abnormal   Collection Time: 02/03/18 10:34 PM  Result Value Ref Range   Color, Urine RED (A) YELLOW   APPearance CLOUDY (A) CLEAR   Specific Gravity, Urine 1.010 1.005 - 1.030   pH  5.0 - 8.0    TEST NOT REPORTED DUE TO COLOR INTERFERENCE OF URINE PIGMENT   Glucose, UA (A) NEGATIVE mg/dL    TEST NOT REPORTED DUE TO COLOR INTERFERENCE OF URINE  PIGMENT   Hgb urine dipstick (A) NEGATIVE    TEST NOT REPORTED DUE TO COLOR INTERFERENCE OF URINE PIGMENT   Bilirubin Urine (A) NEGATIVE    TEST NOT REPORTED DUE TO COLOR INTERFERENCE OF URINE PIGMENT   Ketones, ur (A) NEGATIVE mg/dL    TEST NOT REPORTED DUE TO COLOR INTERFERENCE OF URINE PIGMENT   Protein, ur (A) NEGATIVE mg/dL    TEST NOT REPORTED DUE TO COLOR INTERFERENCE OF URINE PIGMENT   Nitrite (A) NEGATIVE    TEST NOT REPORTED DUE TO COLOR INTERFERENCE OF URINE PIGMENT   Leukocytes, UA (A) NEGATIVE    TEST NOT REPORTED DUE TO COLOR INTERFERENCE OF URINE PIGMENT   RBC / HPF TOO NUMEROUS TO COUNT 0 - 5 RBC/hpf   WBC, UA 0-5 0 - 5 WBC/hpf   Bacteria, UA RARE (A) NONE SEEN   Squamous Epithelial / LPF NONE SEEN NONE SEEN  Basic metabolic panel     Status: Abnormal   Collection Time: 02/04/18  5:22 AM  Result Value Ref Range   Sodium 142 135 - 145 mmol/L   Potassium 4.6 3.5 - 5.1 mmol/L   Chloride 110 101 - 111 mmol/L   CO2 25 22 - 32 mmol/L   Glucose, Bld 124 (H) 65 - 99 mg/dL   BUN 18 6 - 20 mg/dL   Creatinine, Ser 1.611.18 0.61 - 1.24 mg/dL   Calcium 9.5 8.9 - 09.610.3 mg/dL   GFR calc non Af Amer 59 (L) >60 mL/min   GFR calc Af Amer >60 >60 mL/min   Anion gap 7 5 - 15  CBC with Differential     Status: Abnormal   Collection Time: 02/04/18  5:22 AM  Result Value Ref Range   WBC 6.9 3.8 - 10.6 K/uL   RBC 4.71 4.40 - 5.90 MIL/uL   Hemoglobin 14.1 13.0 - 18.0 g/dL   HCT 04.541.8 40.940.0 - 81.152.0 %   MCV 88.8 80.0 - 100.0 fL   MCH 29.9 26.0 - 34.0 pg   MCHC 33.7 32.0 - 36.0 g/dL   RDW 91.414.6 (H) 78.211.5 - 95.614.5 %   Platelets 184 150 - 440 K/uL   Neutrophils Relative % 83 %   Neutro Abs 5.7 1.4 - 6.5 K/uL   Lymphocytes Relative 10 %   Lymphs Abs 0.7 (L) 1.0 - 3.6 K/uL   Monocytes Relative 6 %   Monocytes Absolute 0.4 0.2 - 1.0 K/uL   Eosinophils Relative 0 %   Eosinophils Absolute 0.0 0 - 0.7 K/uL   Basophils Relative 1 %   Basophils Absolute 0.0 0 - 0.1 K/uL   No results found for  this or any previous visit (from the past 240 hour(s)). Creatinine: Recent Labs    02/04/18 0522  CREATININE 1.18    Impression/Assessment/plan:  1) Gross hematuria -  resolved, urine clear.  Hematuria may have been from bladder distention or from the prostate.  2) urinary retention-continue Foley catheter.  He should follow-up with Dr. Vernie Ammons Thursday or Friday morning for voiding trial. Pt knows to call the office tomorrow or Tueday.  I talked to him about changing to a smaller catheter but he did not want to do that.  3) BPH-continue tamsulosin.  We discussed the nature risks and benefits of finasteride and it is not something he wants to start again.  He is interested in TURP and we discussed his prostate is large and he may need a staged procedure and other risk of TURP such as bleeding, incontinence and stricture among others.  He will consider and discuss with Dr. Vernie Ammons.  DC home tomorrow with Foley catheter to gravity drainage.  Please page urology with any questions, concerns or change in patient's status.  Jerilee Field 02/04/2018, 5:51 PM

## 2018-02-04 NOTE — ED Provider Notes (Signed)
-----------------------------------------   6:14 AM on 02/04/2018 -----------------------------------------  The patient was previously seen by Dr. Derrill KayGoodman and discharged after a Foley catheter relieved his urinary retention.  The patient had hematuria after catheter was placed but it appeared to have cleared.  However, the patient then developed recurrent gross hematuria which persisted.  The patient otherwise was asymptomatic and clinically stable with normal vital signs.  We performed continuous bladder irrigation for several hours, and he is continue to have persistent gross hematuria.  Basic labs were obtained which are within normal limits, however given the persistent hematuria, I discussed the plan with the patient, and we agreed that the he warranted admission for observation and further CBI and monitoring of his hemoglobin.  I signed the patient out to the hospitalist, Dr. Katheren ShamsSalary.   Dionne BucySiadecki, Mitesh Rosendahl, MD 02/04/18 [phone]

## 2018-02-04 NOTE — Discharge Instructions (Signed)
Please seek medical attention for any high fevers, chest pain, shortness of breath, change in behavior, persistent vomiting, bloody stool or any other new or concerning symptoms.   Indwelling Urinary Catheter Care, Adult Take good care of your catheter to keep it working and to prevent problems. How to wear your catheter Attach your catheter to your leg with tape (adhesive tape) or a leg strap. Make sure it is not too tight. If you use tape, remove any bits of tape that are already on the catheter. How to wear a drainage bag You should have:  A large overnight bag.  A small leg bag.  Overnight Bag You may wear the overnight bag at any time. Always keep the bag below the level of your bladder but off the floor. When you sleep, put a clean plastic bag in a wastebasket. Then hang the bag inside the wastebasket. Leg Bag Never wear the leg bag at night. Always wear the leg bag below your knee. Keep the leg bag secure with a leg strap or tape. How to care for your skin  Clean the skin around the catheter at least once every day.  Shower every day. Do not take baths.  Put creams, lotions, or ointments on your genital area only as told by your doctor.  Do not use powders, sprays, or lotions on your genital area. How to clean your catheter and your skin 1. Wash your hands with soap and water. 2. Wet a washcloth in warm water and gentle (mild) soap. 3. Use the washcloth to clean the skin where the catheter enters your body. Clean downward and wipe away from the catheter in small circles. Do not wipe toward the catheter. 4. Pat the area dry with a clean towel. Make sure to clean off all soap. How to care for your drainage bags Empty your drainage bag when it is ?- full or at least 2-3 times a day. Replace your drainage bag once a month or sooner if it starts to smell bad or look dirty. Do not clean your drainage bag unless told by your doctor. Emptying a drainage bag  Supplies  Needed  Rubbing alcohol.  Gauze pad or cotton ball.  Tape or a leg strap.  Steps 1. Wash your hands with soap and water. 2. Separate (detach) the bag from your leg. 3. Hold the bag over the toilet or a clean container. Keep the bag below your hips and bladder. This stops pee (urine) from going back into the tube. 4. Open the pour spout at the bottom of the bag. 5. Empty the pee into the toilet or container. Do not let the pour spout touch any surface. 6. Put rubbing alcohol on a gauze pad or cotton ball. 7. Use the gauze pad or cotton ball to clean the pour spout. 8. Close the pour spout. 9. Attach the bag to your leg with tape or a leg strap. 10. Wash your hands.  Changing a drainage bag Supplies Needed  Alcohol wipes.  A clean drainage bag.  Adhesive tape or a leg strap.  Steps 1. Wash your hands with soap and water. 2. Separate the dirty bag from your leg. 3. Pinch the rubber catheter with your fingers so that pee does not spill out. 4. Separate the catheter tube from the drainage tube where these tubes connect (at the connection valve). Do not let the tubes touch any surface. 5. Clean the end of the catheter tube with an alcohol wipe. Use a different  alcohol wipe to clean the end of the drainage tube. 6. Connect the catheter tube to the drainage tube of the clean bag. 7. Attach the new bag to the leg with adhesive tape or a leg strap. 8. Wash your hands.  How to prevent infection and other problems  Never pull on your catheter or try to remove it. Pulling can damage tissue in your body.  Always wash your hands before and after touching your catheter.  If a leg strap gets wet, replace it with a dry one.  Drink enough fluids to keep your pee clear or pale yellow, or as told by your doctor.  Do not let the drainage bag or tubing touch the floor.  Wear cotton underwear.  If you are male, wipe from front to back after you poop (have a bowel movement).  Check on  the catheter often to make sure it works and the tubing is not twisted. Get help if:  Your pee is cloudy.  Your pee smells unusually bad.  Your pee is not draining into the bag.  Your tube gets clogged.  Your catheter starts to leak.  Your bladder feels full. Get help right away if:  You have redness, swelling, or pain where the catheter enters your body.  You have fluid, pus, or a bad smell coming from the area where the catheter enters your body.  The area where the catheter enters your body feels warm.  You have a fever.  You have pain in your: ? Stomach (abdomen). ? Legs. ? Lower back. ? Bladder.  You see blood fill the catheter.  Your pee is pink or red.  You feel sick to your stomach (nauseous).  You throw up (vomit).  You have chills.  Your catheter gets pulled out. This information is not intended to replace advice given to you by your health care provider. Make sure you discuss any questions you have with your health care provider. Document Released: 03/04/2013 Document Revised: 10/05/2016 Document Reviewed: 04/22/2014 Elsevier Interactive Patient Education  [employer]2018 [employer]

## 2018-02-05 DIAGNOSIS — K625 Hemorrhage of anus and rectum: Secondary | ICD-10-CM

## 2018-02-05 LAB — CBC
HEMATOCRIT: 37.6 % — AB (ref 40.0–52.0)
Hemoglobin: 12.7 g/dL — ABNORMAL LOW (ref 13.0–18.0)
MCH: 29.9 pg (ref 26.0–34.0)
MCHC: 33.6 g/dL (ref 32.0–36.0)
MCV: 88.8 fL (ref 80.0–100.0)
PLATELETS: 180 10*3/uL (ref 150–440)
RBC: 4.24 MIL/uL — ABNORMAL LOW (ref 4.40–5.90)
RDW: 14.6 % — AB (ref 11.5–14.5)
WBC: 7 10*3/uL (ref 3.8–10.6)

## 2018-02-05 LAB — HEMOGLOBIN AND HEMATOCRIT, BLOOD
HCT: 38.1 % — ABNORMAL LOW (ref 40.0–52.0)
HEMATOCRIT: 38.3 % — AB (ref 40.0–52.0)
Hemoglobin: 12.8 g/dL — ABNORMAL LOW (ref 13.0–18.0)
Hemoglobin: 12.6 g/dL — ABNORMAL LOW (ref 13.0–18.0)

## 2018-02-05 MED ORDER — PANTOPRAZOLE SODIUM 40 MG IV SOLR
40.0000 mg | Freq: Two times a day (BID) | INTRAVENOUS | Status: DC
  Administered 2018-02-05 – 2018-02-06 (×2): 40 mg via INTRAVENOUS
  Filled 2018-02-05 (×2): qty 40

## 2018-02-05 NOTE — Progress Notes (Signed)
[facility] [place] [facility]   PATIENT NAME: Wesley Weber    MR#:  [id number]  DATE OF BIRTH:  04-03-1946  SUBJECTIVE:  CHIEF COMPLAINT:   Chief Complaint  Patient presents with  . Urinary Retention  Complains of bright red blood per rectum this morning, no other events noted overnight per nursing staff, continues to have improved hematuria, Foley catheter in place, urology input appreciated  REVIEW OF SYSTEMS:  CONSTITUTIONAL: No fever, fatigue or weakness.  EYES: No blurred or double vision.  EARS, NOSE, AND THROAT: No tinnitus or ear pain.  RESPIRATORY: No cough, shortness of breath, wheezing or hemoptysis.  CARDIOVASCULAR: No chest pain, orthopnea, edema.  GASTROINTESTINAL: No nausea, vomiting, diarrhea or abdominal pain.  GENITOURINARY: No dysuria, hematuria.  ENDOCRINE: No polyuria, nocturia,  HEMATOLOGY: No anemia, easy bruising or bleeding SKIN: No rash or lesion. MUSCULOSKELETAL: No joint pain or arthritis.   NEUROLOGIC: No tingling, numbness, weakness.  PSYCHIATRY: No anxiety or depression.   ROS  DRUG ALLERGIES:   Allergies  Allergen Reactions  . Ciprofloxacin Itching  . Omeprazole Rash    VITALS:  Blood pressure 131/61, pulse 64, temperature 98.2 F (36.8 C), temperature source Oral, resp. rate 18, height 6\' 1"  (1.854 m), weight 110.1 kg (242 lb 12.8 oz), SpO2 97 %.  PHYSICAL EXAMINATION:  GENERAL:  72 y.o.-year-old patient lying in the bed with no acute distress.  EYES: Pupils equal, round, reactive to light and accommodation. No scleral icterus. Extraocular muscles intact.  HEENT: Head atraumatic, normocephalic. Oropharynx and nasopharynx clear.  NECK:  Supple, no jugular venous distention. No thyroid enlargement, no tenderness.  LUNGS: Normal breath sounds bilaterally, no wheezing, rales,rhonchi or crepitation. No use of accessory muscles of respiration.  CARDIOVASCULAR: S1, S2 normal. No murmurs, rubs, or gallops.  ABDOMEN:  Soft, nontender, nondistended. Bowel sounds present. No organomegaly or mass.  EXTREMITIES: No pedal edema, cyanosis, or clubbing.  NEUROLOGIC: Cranial nerves II through XII are intact. Muscle strength 5/5 in all extremities. Sensation intact. Gait not checked.  PSYCHIATRIC: The patient is alert and oriented x 3.  SKIN: No obvious rash, lesion, or ulcer.   Physical Exam LABORATORY PANEL:   CBC Recent Labs  Lab 02/05/18 0409  WBC 7.0  HGB 12.7*  HCT 37.6*  PLT 180   ------------------------------------------------------------------------------------------------------------------  Chemistries  Recent Labs  Lab 02/04/18 0522  NA 142  K 4.6  CL 110  CO2 25  GLUCOSE 124*  BUN 18  CREATININE 1.18  CALCIUM 9.5   ------------------------------------------------------------------------------------------------------------------  Cardiac Enzymes No results for input(s): TROPONINI in the last 168 hours. ------------------------------------------------------------------------------------------------------------------  RADIOLOGY:  No results found.  ASSESSMENT AND PLAN:  1 acute urinary retention with gross hematuria Resolving Status post prostate biopsy 3-4 weeks ago by Ihor Gully, MD/urology-negative for cancer per patient, history of BPH Continue Foley catheter, Flomax, s/p continuous bladder irrigation, urology input appreciated-recommended follow-up with his urologist on Thursday/Friday for reevaluation, continue to avoid antiplatelet agents/anticoagulants/NSAID medications  2 acute gross hematuria Plan of care as stated above  3 acute BRBPR Last colonoscopy was to 3 years ago noted for polyps per patient  Consult gastroenterology for expert opinion, Protonix IV twice daily, check H&H every 8 hours, transfuse as needed, clear liquid diet for now  4 history of asthma without exacerbation Stable Breathing treatments as needed  5 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 Management plans discussed with the patient, family and they are in agreement.  CODE STATUS: full  TOTAL TIME TAKING CARE OF THIS PATIENT: 45 minutes.     POSSIBLE D/C IN 1-3 DAYS, DEPENDING ON CLINICAL CONDITION.   Evelena AsaMontell D Corleen Otwell M.D on 02/05/2018   Between 7am to 6pm - Pager - [phone]  After 6pm go to [email or web address] - password [employer] [email or web address]  [facility]  Office  [phone]  CC: Primary care physician; Sherron Mondayejan-Sie, S Ahmed, MD  Note: This dictation was prepared with Dragon dictation along with smaller phrase technology. Any transcriptional errors that result from this process are unintentional.

## 2018-02-05 NOTE — Consult Note (Signed)
Wyline Mood , MD [address], [place], [place], [address] [address], [place], [place], [address] Phone: [phone]  Fax: [phone]  Consultation  Referring Provider:  Dr Katheren Shams  Primary Care Physician:  Sherron Monday, MD Primary Gastroenterologist:  [facility]          Reason for Consultation:     Rectal bleeding   Date of Admission:  02/03/2018 Date of Consultation:  02/05/2018         HPI:   BAWI LAKINS is a 72 y.o. male admitted yesterday with hematuria. Prostate biopsy 3-4 weeks back . Hb om admission was 14.1 grams. This morning had rectal bleeding . Hb stable at 12.6 grams . Last colonoscopy was back in 2016 .   He says that he saw a smear of blood on his toilet paper today . No blood with the stool. Not recurred after. Says he has had issues with hemorrhoids in the past,   No other episodes of rectal bleeding.    Past Medical History:  Diagnosis Date  . Asthma   . Enlarged prostate   . GERD (gastroesophageal reflux disease)   . Hyperlipidemia   . Hypertension   . Prostatitis     Past Surgical History:  Procedure Laterality Date  . ankle Right 2010    Prior to Admission medications   Medication Sig Start Date End Date Taking? Authorizing Provider  albuterol (PROVENTIL HFA;VENTOLIN HFA) 108 (90 Base) MCG/ACT inhaler Inhale 2 puffs into the lungs every 4 (four) hours as needed for wheezing or shortness of breath.   Yes [provider]  aspirin 81 MG tablet Take 81 mg by mouth daily.   Yes [provider]  benazepril (LOTENSIN) 40 MG tablet Take 1 tablet by mouth daily. 01/30/18  Yes [provider]  Cholecalciferol (D3 ADULT PO) Take 1 tablet by mouth daily.   Yes [provider]  ferrous fumarate-iron polysaccharide complex (TANDEM) 162-115.2 MG CAPS Take 1 capsule by mouth daily with breakfast.   Yes [provider]  meloxicam (MOBIC) 15 MG tablet Take 15 mg by mouth daily. 01/25/18   Yes [provider]  pantoprazole (PROTONIX) 20 MG tablet Take 20 mg by mouth daily. 12/20/17  Yes [provider]  tadalafil (CIALIS) 10 MG tablet Take 10 mg by mouth daily as needed for erectile dysfunction.   Yes [provider]  tamsulosin (FLOMAX) 0.4 MG CAPS capsule Take 0.4 mg by mouth daily.    Yes [provider]  THERATEARS 1 % GEL  01/25/18  Yes [provider]    History reviewed. No pertinent family history.   Social History   Tobacco Use  . Smoking status: Never Smoker  . Smokeless tobacco: Never Used  Substance Use Topics  . Alcohol use: No  . Drug use: No    Allergies as of 02/03/2018 - Review Complete 02/03/2018  Allergen Reaction Noted  . Ciprofloxacin Itching 06/18/2014  . Omeprazole Rash 06/18/2014    Review of Systems:    All systems reviewed and negative except where noted in HPI.   Physical Exam:  Vital signs in last 24 hours: Temp:  [98.2 F (36.8 C)-100.1 F (37.8 C)] 98.2 F (36.8 C) (03/18 0449) Pulse Rate:  [52-65] 64 (03/18 0927) Resp:  [18] 18 (03/18 0449) BP: (102-131)/(51-61) 131/61 (03/18 0927) SpO2:  [97 %] 97 % (03/18 0449) Last BM Date: 02/05/18 General:   Pleasant, cooperative in NAD Head:  Normocephalic  and atraumatic. Eyes:   No icterus.   Conjunctiva pink. PERRLA. Ears:  Normal auditory acuity. Neck:  Supple; no masses or thyroidomegaly Lungs: Respirations even and unlabored. Lungs clear to auscultation bilaterally.   No wheezes, crackles, or rhonchi.  Heart:  Regular rate and rhythm;  Without murmur, clicks, rubs or gallops Abdomen:  Soft, nondistended, nontender. Normal bowel sounds. No appreciable masses or hepatomegaly.  No rebound or guarding.  Neurologic:  Alert and oriented x3;  grossly normal neurologically. Skin:  Intact without significant lesions or rashes. Cervical Nodes:  No significant cervical adenopathy. Psych:  Alert and cooperative. Normal affect.  LAB  RESULTS: Recent Labs    02/04/18 0522 02/05/18 0409 02/05/18 1053  WBC 6.9 7.0  --   HGB 14.1 12.7* 12.8*  HCT 41.8 37.6* 38.1*  PLT 184 180  --    BMET Recent Labs    02/04/18 0522  NA 142  K 4.6  CL 110  CO2 25  GLUCOSE 124*  BUN 18  CREATININE 1.18  CALCIUM 9.5   LFT No results for input(s): PROT, ALBUMIN, AST, ALT, ALKPHOS, BILITOT, BILIDIR, IBILI in the last 72 hours. PT/INR No results for input(s): LABPROT, INR in the last 72 hours.  STUDIES: No results found.    Impression / Plan:   Melina FiddlerCharles M Wambold is a 72 y.o. y/o male with recent prostate biopsy. Admitted with heamturia. I have been consulted for rectal bleeding this morning. Last colonoscopy was 3 years back. Hb has been stable . His history is suggestive of bleeding from a hemorrhoid. Since it was only a smear of blood on the toilet paper , suggest to use a stool softener, sitz bath and avoid excess wiping or forceful wiping after a bowel movement. If there is overt blood loss then will need a colonoscopy otherwise can be evaluated as an outpatient  I will sign off.  Please call me if any further GI concerns or questions.  We would like to thank you for the opportunity to participate in the care of Melina FiddlerCharles M Mickelsen.  Thank you for involving me in the care of this patient.      LOS: 1 day   Wyline MoodKiran Brunetta Newingham, MD  02/05/2018, 2:53 PM

## 2018-02-06 LAB — CBC
HEMATOCRIT: 36.7 % — AB (ref 40.0–52.0)
Hemoglobin: 12.3 g/dL — ABNORMAL LOW (ref 13.0–18.0)
MCH: 29.6 pg (ref 26.0–34.0)
MCHC: 33.4 g/dL (ref 32.0–36.0)
MCV: 88.7 fL (ref 80.0–100.0)
Platelets: 169 10*3/uL (ref 150–440)
RBC: 4.14 MIL/uL — AB (ref 4.40–5.90)
RDW: 14.5 % (ref 11.5–14.5)
WBC: 5.6 10*3/uL (ref 3.8–10.6)

## 2018-02-06 MED ORDER — OXYBUTYNIN CHLORIDE 5 MG PO TABS: 5.0000 mg | ORAL_TABLET | Freq: Three times a day (TID) | ORAL | 0 refills | Status: DC | PRN

## 2018-02-06 NOTE — Care Management (Signed)
Case discussed with Dr. Katheren ShamsSalary. No home health needs.

## 2018-02-06 NOTE — Discharge Summary (Signed)
[facility] [place] [facility]   PATIENT NAME: Wesley Weber    MR#:  [id number]  DATE OF BIRTH:  24-May-1946  DATE OF ADMISSION:  02/03/2018 ADMITTING PHYSICIAN: Bertrum Sol, MD  DATE OF DISCHARGE: No discharge date for patient encounter.  PRIMARY CARE PHYSICIAN: Sherron Monday, MD    ADMISSION DIAGNOSIS:  Urinary retention [R33.9] Gross hematuria [R31.0]  DISCHARGE DIAGNOSIS:  Active Problems:   Urinary retention   SECONDARY DIAGNOSIS:   Past Medical History:  Diagnosis Date  . Asthma   . Enlarged prostate   . GERD (gastroesophageal reflux disease)   . Hyperlipidemia   . Hypertension   . Prostatitis     HOSPITAL COURSE:  1acute urinary retention with gross hematuria Resolved S/p prostate biopsy 3-4 weeks ago by Ihor Gully, MD/urology-negative for cancer per patient, history of BPH Continued Foley catheter, Flomax, s/p continuous bladder irrigation, urology did see patient while in house-recommended follow-up with his urologist on this Thursday/Friday for reevaluation, avoided antiplatelet agents/anticoagulants/NSAID medications  2acute gross hematuria Plan of care as stated above  3acute BRBPR Resolved Last colonoscopy was to 3 years ago noted for polyps per patient  Gastroenterology did see patient while in house-no intervention recommended, recommended continued follow-up with his gastroenterologist status post discharge, hemoglobin remained stable, patient follow-up with primary care provider in 1 week for reevaluation   4history of asthma without exacerbation Stable Provided breathing treatments as needed  5chronic GERD without esophagitis Stable Provided PPI per above  6chronic obesity Most likely secondary to excess calories Lifestyle modification recommended  DISCHARGE CONDITIONS:  On the day of discharge patient is afebrile, hemogram stable, tolerating diet, ready for discharge home with  appropriate follow-up with primary care provider and urology as directed, for more specific details please see chart  CONSULTS OBTAINED:  Treatment Team:  Jerilee Field, MD  DRUG ALLERGIES:   Allergies  Allergen Reactions  . Ciprofloxacin Itching  . Omeprazole Rash    DISCHARGE MEDICATIONS:   Allergies as of 02/06/2018      Reactions   Ciprofloxacin Itching   Omeprazole Rash      Medication List    STOP taking these medications   aspirin 81 MG tablet   meloxicam 15 MG tablet Commonly known as:  MOBIC     TAKE these medications   albuterol 108 (90 Base) MCG/ACT inhaler Commonly known as:  PROVENTIL HFA;VENTOLIN HFA Inhale 2 puffs into the lungs every 4 (four) hours as needed for wheezing or shortness of breath.   benazepril 40 MG tablet Commonly known as:  LOTENSIN Take 1 tablet by mouth daily.   D3 ADULT PO Take 1 tablet by mouth daily.   ferrous fumarate-iron polysaccharide complex 162-115.2 MG Caps capsule Commonly known as:  TANDEM Take 1 capsule by mouth daily with breakfast.   oxybutynin 5 MG tablet Commonly known as:  DITROPAN Take 1 tablet (5 mg total) by mouth 3 (three) times daily as needed for bladder spasms.   pantoprazole 20 MG tablet Commonly known as:  PROTONIX Take 20 mg by mouth daily.   tadalafil 10 MG tablet Commonly known as:  CIALIS Take 10 mg by mouth daily as needed for erectile dysfunction.   tamsulosin 0.4 MG Caps capsule Commonly known as:  FLOMAX Take 0.4 mg by mouth daily.   THERATEARS 1 % Gel Generic drug:  Carboxymethylcellulose Sod PF        DISCHARGE INSTRUCTIONS:   If you experience worsening of your admission symptoms,  develop shortness of breath, life threatening emergency, suicidal or homicidal thoughts you must seek medical attention immediately by calling 911 or calling your MD immediately  if symptoms less severe.  You Must read complete instructions/literature along with all the possible adverse  reactions/side effects for all the Medicines you take and that have been prescribed to you. Take any new Medicines after you have completely understood and accept all the possible adverse reactions/side effects.   Please note  You were cared for by a hospitalist during your hospital stay. If you have any questions about your discharge medications or the care you received while you were in the hospital after you are discharged, you can call the unit and asked to speak with the hospitalist on call if the hospitalist that took care of you is not available. Once you are discharged, your primary care physician will handle any further medical issues. Please note that NO REFILLS for any discharge medications will be authorized once you are discharged, as it is imperative that you return to your primary care physician (or establish a relationship with a primary care physician if you do not have one) for your aftercare needs so that they can reassess your need for medications and monitor your lab values.    Today   CHIEF COMPLAINT:   Chief Complaint  Patient presents with  . Urinary Retention    HISTORY OF PRESENT ILLNESS:  1acute urinary retention with gross hematuria Resolving Status post prostate biopsy 3-4 weeks ago by Ihor Gully, MD/urology-negative for cancer per patient, history of BPH Continue Foley catheter, Flomax, s/p continuous bladder irrigation, urology input appreciated-recommended follow-up with his urologist on Thursday/Friday for reevaluation, continue to avoid antiplatelet agents/anticoagulants/NSAID medications  2acute gross hematuria Plan of care as stated above  3acute BRBPR Last colonoscopy was to 3 years ago noted for polyps per patient  Consult gastroenterology for expert opinion, Protonix IV twice daily, check H&H every 8 hours, transfuse as needed, clear liquid diet for now  4history of asthma without exacerbation Stable Breathing treatments as  needed  5chronic GERD without esophagitis Stable PPI per above  6chronic obesity Most likely secondary to excess calories Lifestyle modification recommended  VITAL SIGNS:  Blood pressure 132/66, pulse 65, temperature 98.8 F (37.1 C), temperature source Oral, resp. rate 16, height 6\' 1"  (1.854 m), weight 110.1 kg (242 lb 12.8 oz), SpO2 100 %.  I/O:    Intake/Output Summary (Last 24 hours) at 02/06/2018 1026 Last data filed at 02/06/2018 0500 Gross per 24 hour  Intake 1733.75 ml  Output 2400 ml  Net -666.25 ml    PHYSICAL EXAMINATION:  GENERAL:  72 y.o.-year-old patient lying in the bed with no acute distress.  EYES: Pupils equal, round, reactive to light and accommodation. No scleral icterus. Extraocular muscles intact.  HEENT: Head atraumatic, normocephalic. Oropharynx and nasopharynx clear.  NECK:  Supple, no jugular venous distention. No thyroid enlargement, no tenderness.  LUNGS: Normal breath sounds bilaterally, no wheezing, rales,rhonchi or crepitation. No use of accessory muscles of respiration.  CARDIOVASCULAR: S1, S2 normal. No murmurs, rubs, or gallops.  ABDOMEN: Soft, non-tender, non-distended. Bowel sounds present. No organomegaly or mass.  EXTREMITIES: No pedal edema, cyanosis, or clubbing.  NEUROLOGIC: Cranial nerves II through XII are intact. Muscle strength 5/5 in all extremities. Sensation intact. Gait not checked.  PSYCHIATRIC: The patient is alert and oriented x 3.  SKIN: No obvious rash, lesion, or ulcer.   DATA REVIEW:   CBC Recent Labs  Lab 02/06/18 0205  WBC 5.6  HGB 12.3*  HCT 36.7*  PLT 169    Chemistries  Recent Labs  Lab 02/04/18 0522  NA 142  K 4.6  CL 110  CO2 25  GLUCOSE 124*  BUN 18  CREATININE 1.18  CALCIUM 9.5    Cardiac Enzymes No results for input(s): TROPONINI in the last 168 hours.  Microbiology Results  Results for orders placed or performed during the hospital encounter of 06/03/10  Bone marrow exam      Status: None   Collection Time: 06/03/10  9:35 AM  Result Value Ref Range Status   Bone Marrow Exam SEE PATHOLOGY REPORT [id number]  Final    RADIOLOGY:  No results found.  EKG:  No orders found for this or any previous visit.    Management plans discussed with the patient, family and they are in agreement.  CODE STATUS:     Code Status Orders  (From admission, onward)        Start     Ordered   02/04/18 0958  Full code  Continuous     02/04/18 0957    Code Status History    Date Active Date Inactive Code Status Order ID Comments User Context   This patient has a current code status but no historical code status.      TOTAL TIME TAKING CARE OF THIS PATIENT: 45 minutes.    Evelena AsaMontell D Shyanna Klingel M.D on 02/06/2018 at 10:26 AM  Between 7am to 6pm - Pager - [phone]  After 6pm go to [email or web address] - password [email or web address]  [facility]  Office  [phone]  CC: Primary care physician; Sherron Mondayejan-Sie, S Ahmed, MD   Note: This dictation was prepared with Dragon dictation along with smaller phrase technology. Any transcriptional errors that result from this process are unintentional.

## 2018-02-06 NOTE — Plan of Care (Signed)
Pt d/ced home.  Still having some bladder spasms and blood clots in urine. No more rectal bleeding. Hgb has been stable.  Patient will d/c with foley and will follow up with urologist this week.  IV removed.  Foley bag switched to leg bag.  Will review foley care with patient and med list.  Patient will transport home with his wife.

## 2018-02-09 ENCOUNTER — Other Ambulatory Visit: Payer: Self-pay

## 2018-02-09 ENCOUNTER — Emergency Department (HOSPITAL_COMMUNITY)
Admission: EM | Admit: 2018-02-09 | Discharge: 2018-02-09 | Disposition: A | Discharge status: Home / Self Care | Payer: Medicare Other | Attending: Emergency Medicine | Admitting: Emergency Medicine

## 2018-02-09 ENCOUNTER — Encounter (HOSPITAL_COMMUNITY): Payer: Self-pay

## 2018-02-09 DIAGNOSIS — R339 Retention of urine, unspecified: Secondary | ICD-10-CM | POA: Insufficient documentation

## 2018-02-09 DIAGNOSIS — I10 Essential (primary) hypertension: Secondary | ICD-10-CM | POA: Insufficient documentation

## 2018-02-09 DIAGNOSIS — J45909 Unspecified asthma, uncomplicated: Secondary | ICD-10-CM | POA: Insufficient documentation

## 2018-02-09 DIAGNOSIS — R109 Unspecified abdominal pain: Secondary | ICD-10-CM | POA: Diagnosis present

## 2018-02-09 DIAGNOSIS — Z7982 Long term (current) use of aspirin: Secondary | ICD-10-CM | POA: Insufficient documentation

## 2018-02-09 DIAGNOSIS — Z79899 Other long term (current) drug therapy: Secondary | ICD-10-CM | POA: Insufficient documentation

## 2018-02-09 DIAGNOSIS — N400 Enlarged prostate without lower urinary tract symptoms: Secondary | ICD-10-CM | POA: Diagnosis not present

## 2018-02-09 MED ORDER — CEPHALEXIN 500 MG PO CAPS: 500.0000 mg | ORAL_CAPSULE | Freq: Four times a day (QID) | ORAL | 0 refills | Status: DC

## 2018-02-09 NOTE — ED Notes (Signed)
PT FOLEY CATH JUST OUTSIDE OF PENIS. THIS NOT GIVING MUCH ROOM FOR SECURE LOCK OR LEG BAG AT THIS TIME. PT UNDERSTANDS ONCE FOLEY CATH CAN MOVE DOWN TO GIVE MORE MOVEMENT LEG BAG CAN BE SAFELY APPLIED. PT ALSO HAS ADDITIONAL OUTPUT. PT AGREED TO APPLY LATER. EDP PICKERING MADE AWARE. EDP PICKERING DID EVALUATE FOLEY PLACEMENT AND OK FOR DISCHARGE

## 2018-02-09 NOTE — ED Notes (Signed)
FOLEY INTACT AND DRAINING, LEG BAG GIVEN AT DISCHARGE

## 2018-02-09 NOTE — ED Notes (Signed)
EDP Provider at bedside. PICKERING

## 2018-02-09 NOTE — ED Triage Notes (Signed)
Patient c/o bladder spasms and not voiding since 0500. C/o bladder spasms x 1 week. [facility] went [facility] and had his foley cath removed. Patient was also told he had a UTI and did not get his antibiotic Rx. [facility].

## 2018-02-09 NOTE — ED Notes (Signed)
DELAY WITH DISCHARGE. PT C/O OF PAIN AT PELVIC AREA. PT IS DRAINING INTO FOLEY. EDP PICKERING AT BEDSIDE WITH ULTRASOUND. FAMILY PRESENT.

## 2018-02-09 NOTE — ED Provider Notes (Addendum)
[facility]-EMERGENCY DEPT Provider Note   CSN: [id number] Arrival date & time: 02/09/18  4098     History   Chief Complaint Chief Complaint  Patient presents with  . Urinary Retention  . bladder spasms    HPI Wesley Weber is a 72 y.o. male.  HPI Patient presents with urinary retention.  History of same.  Had his Foley catheter removed yesterday by Dr. Vernie Ammons.  States he is supposed to be on antibiotics but has not had it filled yet.  Supposed to be on Keflex.  No fevers or chills.  Has crampy lower abdominal pain.  Has had bladder spasms even with the catheter in.  Had had some previous hematuria and had recent admission in the hospital for monitoring of that.  Bleeding has decreased.  No fevers or chills.  No lightheadedness or dizziness.  Has the crampy lower abdominal pain.  Appears uncomfortable.  Last urinated around 5 this morning. Past Medical History:  Diagnosis Date  . Asthma   . Enlarged prostate   . GERD (gastroesophageal reflux disease)   . Hyperlipidemia   . Hypertension   . Prostatitis     Patient Active Problem List   Diagnosis Date Noted  . Urinary retention 02/04/2018    Past Surgical History:  Procedure Laterality Date  . ankle Right 2010       Home Medications    Prior to Admission medications   Medication Sig Start Date End Date Taking? Authorizing Provider  acetaminophen (TYLENOL) 325 MG tablet Take 325 mg by mouth every 6 (six) hours as needed for mild pain.   Yes [provider]  albuterol (PROVENTIL HFA;VENTOLIN HFA) 108 (90 Base) MCG/ACT inhaler Inhale 2 puffs into the lungs every 4 (four) hours as needed for wheezing or shortness of breath.   Yes [provider]  benazepril (LOTENSIN) 40 MG tablet Take 40 mg by mouth daily.  01/30/18  Yes [provider]  chlorpheniramine (CHLOR-TRIMETON) 4 MG tablet Take 4 mg by mouth daily as needed for allergies.   Yes [provider]    Cholecalciferol (VITAMIN D) 2000 units CAPS Take 2,000 Units by mouth daily.   Yes [provider]  ferrous fumarate-iron polysaccharide complex (TANDEM) 162-115.2 MG CAPS Take 1 capsule by mouth daily with supper.    Yes [provider]  meloxicam (MOBIC) 15 MG tablet Take 15 mg by mouth daily as needed for pain.   Yes [provider]  mometasone (ASMANEX) 220 MCG/INH inhaler Inhale 2 puffs into the lungs at bedtime.   Yes [provider]  oxybutynin (DITROPAN) 5 MG tablet Take 1 tablet (5 mg total) by mouth 3 (three) times daily as needed for bladder spasms. 02/06/18  Yes Salary, Montell D, MD  pantoprazole (PROTONIX) 40 MG tablet Take 40 mg by mouth daily.  12/20/17  Yes [provider]  tadalafil (CIALIS) 5 MG tablet Take 20 mg by mouth daily as needed for erectile dysfunction.    Yes [provider]  tamsulosin (FLOMAX) 0.4 MG CAPS capsule Take 0.8 mg by mouth daily at 12 noon.    Yes [provider]  THERATEARS 1 % GEL Place 1 drop into both eyes every 4 (four) hours as needed (dry eyes).  01/25/18  Yes [provider]  aspirin EC 81 MG tablet Take 81 mg by mouth daily.    [provider]  cephALEXin (KEFLEX) 500 MG capsule Take 1 capsule (500 mg total) by mouth 4 (  four) times daily. 02/09/18   Benjiman CorePickering, Florella Mcneese, MD    Family History Family History  Problem Relation Age of Onset  . Diabetes Mother   . Cancer Father     Social History Social History   Tobacco Use  . Smoking status: Never Smoker  . Smokeless tobacco: Never Used  Substance Use Topics  . Alcohol use: No  . Drug use: No     Allergies   Ciprofloxacin and Omeprazole   Review of Systems Review of Systems  Constitutional: Negative for appetite change.  HENT: Negative for congestion.   Respiratory: Negative for shortness of breath.   Cardiovascular: Negative for chest pain.  Gastrointestinal: Positive for abdominal pain.  Endocrine:  Negative for polyuria.  Genitourinary: Positive for decreased urine volume and difficulty urinating. Negative for flank pain.  Skin: Negative for wound.  Neurological: Negative for weakness.  Hematological: Does not bruise/bleed easily.  Psychiatric/Behavioral: Negative for confusion.     Physical Exam Updated Vital Signs BP (!) 121/57 (BP Location: Right Arm)   Pulse 69   Temp 98.1 F (36.7 C) (Oral)   Resp 16   Ht 6\' 1"  (1.854 m)   Wt 108.9 kg (240 lb)   SpO2 98%   BMI 31.66 kg/m   Physical Exam  Constitutional: He appears well-developed.  Patient appears uncomfortable  HENT:  Head: Atraumatic.  Eyes: Right eye exhibits no discharge. Left eye exhibits no discharge.  Neck: Neck supple.  Cardiovascular: Normal rate.  Pulmonary/Chest: Effort normal.  Abdominal: There is tenderness.  Lower abdominal tenderness and fullness.  Genitourinary: Penis normal.  Genitourinary Comments: Suprapubic mass.  Musculoskeletal: He exhibits no edema.  Neurological: He is alert.  Skin: Skin is warm. Capillary refill takes less than 2 seconds.     ED Treatments / Results  Labs (all labs ordered are listed, but only abnormal results are displayed) Labs Reviewed - No data to display  EKG  EKG Interpretation None       Radiology No results found.  Procedures Procedures (including critical care time)  Medications Ordered in ED Medications - No data to display   Initial Impression / Assessment and Plan / ED Course  I have reviewed the triage vital signs and the nursing notes.  Pertinent labs & imaging results that were available during my care of the patient were reviewed by me and considered in my medical decision making (see chart for details).     Patient with urinary retention.  Recent Foley catheter removal.  Foley catheter replaced and patient feels much better.  Will give prescription for the antibiotics that he was supposed to be taking.  Will have follow-up with  urology as needed.  Final Clinical Impressions(s) / ED Diagnoses   Final diagnoses:  Urinary retention    ED Discharge Orders        Ordered    cephALEXin (KEFLEX) 500 MG capsule  4 times daily     02/09/18 1033       Benjiman CorePickering, Wirt Hemmerich, MD 02/09/18 1038    Benjiman CorePickering, Fredrick Geoghegan, MD 02/24/18 1021    Benjiman CorePickering, Arvo Ealy, MD 03/08/18 [phone]

## 2018-02-12 ENCOUNTER — Telehealth: Payer: Self-pay

## 2018-02-12 NOTE — Telephone Encounter (Signed)
EMMI Follow-up: Received call from patient's wife that they had received a call from my number but by the time she got her husband to the phone the called ended.  I explained our new automated process for when patient's discharge and thanked her for letting me know they did not have enough time to respond.   Wife put her husband on the phone and I read him the questions.  Patient has no needs at this time. Reported EMMI case to leadership.

## 2018-05-20 ENCOUNTER — Emergency Department (HOSPITAL_COMMUNITY)
Admission: EM | Admit: 2018-05-20 | Discharge: 2018-05-20 | Disposition: A | Discharge status: Home / Self Care | Payer: Medicare Other | Attending: Emergency Medicine | Admitting: Emergency Medicine

## 2018-05-20 ENCOUNTER — Encounter (HOSPITAL_COMMUNITY): Payer: Self-pay

## 2018-05-20 ENCOUNTER — Other Ambulatory Visit: Payer: Self-pay

## 2018-05-20 DIAGNOSIS — J45909 Unspecified asthma, uncomplicated: Secondary | ICD-10-CM | POA: Diagnosis not present

## 2018-05-20 DIAGNOSIS — R3129 Other microscopic hematuria: Secondary | ICD-10-CM | POA: Diagnosis not present

## 2018-05-20 DIAGNOSIS — Z7982 Long term (current) use of aspirin: Secondary | ICD-10-CM | POA: Diagnosis not present

## 2018-05-20 DIAGNOSIS — R339 Retention of urine, unspecified: Secondary | ICD-10-CM | POA: Diagnosis present

## 2018-05-20 DIAGNOSIS — I10 Essential (primary) hypertension: Secondary | ICD-10-CM | POA: Diagnosis not present

## 2018-05-20 DIAGNOSIS — Z79899 Other long term (current) drug therapy: Secondary | ICD-10-CM | POA: Diagnosis not present

## 2018-05-20 LAB — URINALYSIS, ROUTINE W REFLEX MICROSCOPIC
BACTERIA UA: NONE SEEN
Bilirubin Urine: NEGATIVE
GLUCOSE, UA: NEGATIVE mg/dL
KETONES UR: NEGATIVE mg/dL
NITRITE: NEGATIVE
PROTEIN: NEGATIVE mg/dL
RBC / HPF: >50 RBC/hpf — ABNORMAL HIGH (ref 0–5)
Specific Gravity, Urine: 1.012 (ref 1.005–1.030)
pH: 5 (ref 5.0–8.0)

## 2018-05-20 NOTE — ED Triage Notes (Signed)
States yesterday urine flow slowed down and states this am took about 30 minutes to urinate just a little urine.

## 2018-05-20 NOTE — ED Notes (Signed)
Urine culture sent down with UA. 

## 2018-05-20 NOTE — ED Notes (Signed)
Discharge instructions reviewed with pt. Pt verbalized understanding. Pt to follow up with urology. Pt ambulatory to waiting room.

## 2018-05-20 NOTE — ED Provider Notes (Signed)
[facility]-EMERGENCY DEPT Provider Note  CSN: [id number] Arrival date & time: 05/20/18 0865  Chief Complaint(s) Urinary Retention  HPI Wesley Weber is a 72 y.o. male  s/p Urolift requiring Foley that was removed on the early last week here with frequency and decreased UOP for several hours.  Patient denies any associated dysuria.  States that the urine is clear.  No abdominal pain.  No nausea or vomiting.  No flank pain.   HPI  Past Medical History Past Medical History:  Diagnosis Date  . Asthma   . Enlarged prostate   . GERD (gastroesophageal reflux disease)   . Hyperlipidemia   . Hypertension   . Prostatitis    Patient Active Problem List   Diagnosis Date Noted  . Urinary retention 02/04/2018   Home Medication(s) Prior to Admission medications   Medication Sig Start Date End Date Taking? Authorizing Provider  acetaminophen (TYLENOL) 325 MG tablet Take 325 mg by mouth every 6 (six) hours as needed for mild pain.    [provider]  albuterol (PROVENTIL HFA;VENTOLIN HFA) 108 (90 Base) MCG/ACT inhaler Inhale 2 puffs into the lungs every 4 (four) hours as needed for wheezing or shortness of breath.    [provider]  aspirin EC 81 MG tablet Take 81 mg by mouth daily.    [provider]  benazepril (LOTENSIN) 40 MG tablet Take 40 mg by mouth daily.  01/30/18   [provider]  cephALEXin (KEFLEX) 500 MG capsule Take 1 capsule (500 mg total) by mouth 4 (four) times daily. 02/09/18   Benjiman Core, MD  chlorpheniramine (CHLOR-TRIMETON) 4 MG tablet Take 4 mg by mouth daily as needed for allergies.    [provider]  Cholecalciferol (VITAMIN D) 2000 units CAPS Take 2,000 Units by mouth daily.    [provider]  ferrous fumarate-iron polysaccharide complex (TANDEM) 162-115.2 MG CAPS Take 1 capsule by mouth daily with supper.     [provider]  meloxicam (MOBIC) 15 MG tablet Take 15 mg by  mouth daily as needed for pain.    [provider]  mometasone [facility]) 220 MCG/INH inhaler Inhale 2 puffs into the lungs at bedtime.    [provider]  oxybutynin (DITROPAN) 5 MG tablet Take 1 tablet (5 mg total) by mouth 3 (three) times daily as needed for bladder spasms. 02/06/18   Salary, Evelena Asa, MD  pantoprazole (PROTONIX) 40 MG tablet Take 40 mg by mouth daily.  12/20/17   [provider]  tadalafil (CIALIS) 5 MG tablet Take 20 mg by mouth daily as needed for erectile dysfunction.     [provider]  tamsulosin (FLOMAX) 0.4 MG CAPS capsule Take 0.8 mg by mouth daily at 12 noon.     [provider]  THERATEARS 1 % GEL Place 1 drop into both eyes every 4 (four) hours as needed (dry eyes).  01/25/18   [provider]  Past Surgical History Past Surgical History:  Procedure Laterality Date  . ankle Right 2010   Family History Family History  Problem Relation Age of Onset  . Diabetes Mother   . Cancer Father     Social History Social History   Tobacco Use  . Smoking status: Never Smoker  . Smokeless tobacco: Never Used  Substance Use Topics  . Alcohol use: No  . Drug use: No   Allergies Ciprofloxacin and Omeprazole  Review of Systems Review of Systems All other systems are reviewed and are negative for acute change except as noted in the HPI  Physical Exam Vital Signs  I have reviewed the triage vital signs BP (!) 154/80   Pulse 64   Temp 98.6 F (37 C) (Oral)   Resp 18   Ht 6\' 1"  (1.854 m)   Wt 108.9 kg (240 lb)   SpO2 94%   BMI 31.66 kg/m   Physical Exam  Constitutional: He is oriented to person, place, and time. He appears well-developed and well-nourished. No distress.  HENT:  Head: Normocephalic and atraumatic.  Right Ear: External ear normal.  Left Ear: External ear  normal.  Nose: Nose normal.  Mouth/Throat: Mucous membranes are normal. No trismus in the jaw.  Eyes: Conjunctivae and EOM are normal. No scleral icterus.  Neck: Normal range of motion and phonation normal.  Cardiovascular: Normal rate and regular rhythm.  Pulmonary/Chest: Effort normal. No stridor. No respiratory distress.  Abdominal: He exhibits no distension. There is no tenderness. There is no rigidity, no rebound and no guarding.  Genitourinary: Penis normal. Right testis shows no swelling. Left testis shows no swelling.  Musculoskeletal: Normal range of motion. He exhibits no edema.  Neurological: He is alert and oriented to person, place, and time.  Skin: He is not diaphoretic.  Psychiatric: He has a normal mood and affect. His behavior is normal.  Vitals reviewed.   ED Results and Treatments Labs (all labs ordered are listed, but only abnormal results are displayed) Labs Reviewed  URINALYSIS, ROUTINE W REFLEX MICROSCOPIC - Abnormal; Notable for the following components:      Result Value   Hgb urine dipstick LARGE (*)    Leukocytes, UA SMALL (*)    RBC / HPF >50 (*)    All other components within normal limits                                                                                                                         EKG  EKG Interpretation  Date/Time:    Ventricular Rate:    PR Interval:    QRS Duration:   QT Interval:    QTC Calculation:   R Axis:     Text Interpretation:        Radiology No results found. Pertinent labs & imaging results that were available during my care of the patient were reviewed by me and considered in my medical decision making (see chart for details).  Medications  Ordered in ED Medications - No data to display                                                                                                                                  Procedures Procedures  (including critical care time)  Medical Decision Making / ED  Course I have reviewed the nursing notes for this encounter and the patient's prior records (if available in EHR or on provided paperwork).    Bladder scan with 137 cc.  UA with hematuria but not convincing for urinary tract infection.  Abdomen benign.   The patient appears reasonably screened and/or stabilized for discharge and I doubt any other medical condition or other [facility] requiring further screening, evaluation, or treatment in the ED at this time prior to discharge.  The patient is safe for discharge with strict return precautions.   Final Clinical Impression(s) / ED Diagnoses Final diagnoses:  Other microscopic hematuria    Disposition: Discharge  Condition: Good  I have discussed the results, Dx and Tx plan with the patient who expressed understanding and agree(s) with the plan. Discharge instructions discussed at great length. The patient was given strict return precautions who verbalized understanding of the instructions. No further questions at time of discharge.    ED Discharge Orders    None       Follow Up: Urology  Schedule an appointment as soon as possible for a visit  in 3-5 days, If symptoms do not improve or  worsen     This chart was dictated using voice recognition software.  Despite best efforts to proofread,  errors can occur which can change the documentation meaning.   Nira Conn, MD 05/20/18 [phone]

## 2018-08-16 ENCOUNTER — Other Ambulatory Visit: Payer: Self-pay | Admitting: Urology

## 2018-08-18 ENCOUNTER — Encounter (HOSPITAL_COMMUNITY): Payer: Self-pay | Admitting: *Deleted

## 2018-08-18 ENCOUNTER — Emergency Department (HOSPITAL_COMMUNITY): Payer: Medicare Other

## 2018-08-18 ENCOUNTER — Other Ambulatory Visit: Payer: Self-pay

## 2018-08-18 ENCOUNTER — Emergency Department (HOSPITAL_COMMUNITY)
Admission: EM | Admit: 2018-08-18 | Discharge: 2018-08-19 | Disposition: A | Discharge status: Home / Self Care | Payer: Medicare Other | Attending: Emergency Medicine | Admitting: Emergency Medicine

## 2018-08-18 DIAGNOSIS — M25512 Pain in left shoulder: Secondary | ICD-10-CM | POA: Diagnosis not present

## 2018-08-18 DIAGNOSIS — J45909 Unspecified asthma, uncomplicated: Secondary | ICD-10-CM | POA: Diagnosis not present

## 2018-08-18 DIAGNOSIS — T50905A Adverse effect of unspecified drugs, medicaments and biological substances, initial encounter: Secondary | ICD-10-CM | POA: Diagnosis not present

## 2018-08-18 DIAGNOSIS — Z79899 Other long term (current) drug therapy: Secondary | ICD-10-CM | POA: Diagnosis not present

## 2018-08-18 DIAGNOSIS — I10 Essential (primary) hypertension: Secondary | ICD-10-CM | POA: Insufficient documentation

## 2018-08-18 DIAGNOSIS — R600 Localized edema: Secondary | ICD-10-CM | POA: Diagnosis not present

## 2018-08-18 DIAGNOSIS — R079 Chest pain, unspecified: Secondary | ICD-10-CM | POA: Diagnosis present

## 2018-08-18 LAB — CBC
HEMATOCRIT: 33.1 % — AB (ref 39.0–52.0)
HEMOGLOBIN: 10.8 g/dL — AB (ref 13.0–17.0)
MCH: 27.1 pg (ref 26.0–34.0)
MCHC: 32.6 g/dL (ref 30.0–36.0)
MCV: 83.2 fL (ref 78.0–100.0)
Platelets: 260 10*3/uL (ref 150–400)
RBC: 3.98 MIL/uL — AB (ref 4.22–5.81)
RDW: 13.9 % (ref 11.5–15.5)
WBC: 4.5 10*3/uL (ref 4.0–10.5)

## 2018-08-18 LAB — I-STAT TROPONIN, ED: Troponin i, poc: 0.01 ng/mL (ref 0.00–0.08)

## 2018-08-18 LAB — BASIC METABOLIC PANEL
ANION GAP: 8 (ref 5–15)
BUN: 23 mg/dL (ref 8–23)
CO2: 25 mmol/L (ref 22–32)
Calcium: 9.9 mg/dL (ref 8.9–10.3)
Chloride: 112 mmol/L — ABNORMAL HIGH (ref 98–111)
Creatinine, Ser: 1.48 mg/dL — ABNORMAL HIGH (ref 0.61–1.24)
GFR calc non Af Amer: 45 mL/min — ABNORMAL LOW (ref >60)
GFR, EST AFRICAN AMERICAN: 52 mL/min — AB (ref >60)
Glucose, Bld: 122 mg/dL — ABNORMAL HIGH (ref 70–99)
POTASSIUM: 3.9 mmol/L (ref 3.5–5.1)
SODIUM: 145 mmol/L (ref 135–145)

## 2018-08-18 NOTE — ED Triage Notes (Signed)
Pt reports left upper chest/left axilla pain that started yesterday. Onset of shortness of breath, nausea and right leg swelling.

## 2018-08-19 NOTE — ED Provider Notes (Signed)
[facility]-EMERGENCY DEPT Provider Note: Lowella Dell, MD, FACEP  CSN: [id number] MRN: [id number] ARRIVAL: 08/18/18 at 2249 ROOM: WA10/WA10   CHIEF COMPLAINT  Chest Pain   HISTORY OF PRESENT ILLNESS  08/19/18 12:50 AM Wesley Weber is a 72 y.o. male who was recently started on oral minoxidil for hypertension.  He is here with 4 days of pain in his left shoulder region which he attributes to "sleeping on it wrong".  The pain in circles his left shoulder and is not severe.  It is worse with certain positions and certain movement.  He denies shortness of breath, nausea, vomiting or diaphoresis.  He is also here with some mild edema of his lower legs.  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   Past Medical History:  Diagnosis Date  . Asthma   . Enlarged prostate   . GERD (gastroesophageal reflux disease)   . Hyperlipidemia   . Hypertension   . Prostatitis     Past Surgical History:  Procedure Laterality Date  . ankle Right 2010    Family History  Problem Relation Age of Onset  . Diabetes Mother   . Cancer Father     Social History   Tobacco Use  . Smoking status: Never Smoker  . Smokeless tobacco: Never Used  Substance Use Topics  . Alcohol use: No  . Drug use: No    Prior to Admission medications   Medication Sig Start Date End Date Taking? Authorizing Provider  acetaminophen (TYLENOL) 325 MG tablet Take 325 mg by mouth every 6 (six) hours as needed for mild pain.    [provider]  albuterol (PROVENTIL HFA;VENTOLIN HFA) 108 (90 Base) MCG/ACT inhaler Inhale 2 puffs into the lungs every 4 (four) hours as needed for wheezing or shortness of breath.    [provider]  aspirin EC 81 MG tablet Take 81 mg by mouth daily.    [provider]  benazepril (LOTENSIN) 40 MG tablet Take 40 mg by mouth daily.  01/30/18   [provider]  cephALEXin (KEFLEX) 500 MG capsule Take 1 capsule (500 mg  total) by mouth 4 (four) times daily. 02/09/18   Benjiman Core, MD  chlorpheniramine (CHLOR-TRIMETON) 4 MG tablet Take 4 mg by mouth daily as needed for allergies.    [provider]  Cholecalciferol (VITAMIN D) 2000 units CAPS Take 2,000 Units by mouth daily.    [provider]  ferrous fumarate-iron polysaccharide complex (TANDEM) 162-115.2 MG CAPS Take 1 capsule by mouth daily with supper.     [provider]  meloxicam (MOBIC) 15 MG tablet Take 15 mg by mouth daily as needed for pain.    [provider]  mometasone [facility]) 220 MCG/INH inhaler Inhale 2 puffs into the lungs at bedtime.    [provider]  oxybutynin (DITROPAN) 5 MG tablet Take 1 tablet (5 mg total) by mouth 3 (three) times daily as needed for bladder spasms. 02/06/18   Salary, Evelena Asa, MD  pantoprazole (PROTONIX) 40 MG tablet Take 40 mg by mouth daily.  12/20/17   [provider]  tadalafil (CIALIS) 5 MG tablet Take 20 mg by mouth daily as needed for erectile dysfunction.     [provider]  tamsulosin (FLOMAX) 0.4 MG CAPS capsule Take 0.8 mg by mouth daily at 12 noon.     [provider]  THERATEARS 1 % GEL Place 1 drop into both eyes  every 4 (four) hours as needed (dry eyes).  01/25/18   [provider]    Allergies Ciprofloxacin and Omeprazole   REVIEW OF SYSTEMS  Negative except as noted here or in the History of Present Illness.   PHYSICAL EXAMINATION  Initial Vital Signs Blood pressure (!) 147/71, pulse 91, temperature 98.3 F (36.8 C), temperature source Oral, resp. rate 14, SpO2 100 %.  Examination General: Well-developed, well-nourished male in no acute distress; appearance consistent with age of record HENT: normocephalic; atraumatic Eyes: pupils equal, round and reactive to light; extraocular muscles intact Neck: supple Heart: regular rate and rhythm; no murmur Lungs: clear to auscultation bilaterally Abdomen: soft;  nondistended; nontender; no masses or hepatosplenomegaly; bowel sounds present Extremities: No deformity; full range of motion; pulses normal; mild pain on movement of, and certain positions of, the left shoulder; trace edema of lower legs and ankles Neurologic: Awake, alert and oriented; motor function intact in all extremities and symmetric; no facial droop Skin: Warm and dry Psychiatric: Normal mood and affect   RESULTS  Summary of this visit's results, reviewed by myself:   EKG Interpretation  Date/Time:  Saturday August 18 2018 22:55:03 EDT Ventricular Rate:  104 PR Interval:    QRS Duration: 85 QT Interval:  323 QTC Calculation: 425 R Axis:   -22 Text Interpretation:  Sinus tachycardia Borderline left axis deviation Low voltage, precordial leads Abnormal R-wave progression, early transition Nonspecific T abnormalities, lateral leads Baseline wander in lead(s) V3 Rate is faster Confirmed by Deneice Wack ([id number]) on 08/18/2018 10:58:03 PM      Laboratory Studies: Results for orders placed or performed during the hospital encounter of 08/18/18 (from the past 24 hour(s))  Basic metabolic panel     Status: Abnormal   Collection Time: 08/18/18 11:06 PM  Result Value Ref Range   Sodium 145 135 - 145 mmol/L   Potassium 3.9 3.5 - 5.1 mmol/L   Chloride 112 (H) 98 - 111 mmol/L   CO2 25 22 - 32 mmol/L   Glucose, Bld 122 (H) 70 - 99 mg/dL   BUN 23 8 - 23 mg/dL   Creatinine, Ser 2.20 (H) 0.61 - 1.24 mg/dL   Calcium 9.9 8.9 - 25.4 mg/dL   GFR calc non Af Amer 45 (L) >60 mL/min   GFR calc Af Amer 52 (L) >60 mL/min   Anion gap 8 5 - 15  CBC     Status: Abnormal   Collection Time: 08/18/18 11:06 PM  Result Value Ref Range   WBC 4.5 4.0 - 10.5 K/uL   RBC 3.98 (L) 4.22 - 5.81 MIL/uL   Hemoglobin 10.8 (L) 13.0 - 17.0 g/dL   HCT 27.0 (L) 62.3 - 76.2 %   MCV 83.2 78.0 - 100.0 fL   MCH 27.1 26.0 - 34.0 pg   MCHC 32.6 30.0 - 36.0 g/dL   RDW 83.1 51.7 - 61.6 %   Platelets 260 150 - 400  K/uL  I-stat troponin, ED     Status: None   Collection Time: 08/18/18 11:09 PM  Result Value Ref Range   Troponin i, poc 0.01 0.00 - 0.08 ng/mL   Comment 3           Imaging Studies: Dg Chest 2 View  Result Date: 08/18/2018 CLINICAL DATA:  Chest pain EXAM: CHEST - 2 VIEW COMPARISON:  None. FINDINGS: The heart size and mediastinal contours are within normal limits. Both lungs are clear. No acute osseous abnormality. Prominent costochondral articulations anteriorly. IMPRESSION: No  active cardiopulmonary disease. Electronically Signed   By: Jasmine Pang M.D.   On: 08/18/2018 23:55    ED COURSE and MDM  Nursing notes and initial vitals signs, including pulse oximetry, reviewed.  Vitals:   08/18/18 2254 08/18/18 2358  BP: (!) 183/90 (!) 147/71  Pulse: (!) 113 91  Resp: 18 14  Temp: 98.3 F (36.8 C)   TempSrc: Oral   SpO2: 97% 100%   The patient's edema is likely a side effect of minoxidil which is known to cause peripheral edema.  His chest pain is reproducible with movement of the left shoulder.  His EKG is unchanged and his troponin is negative.  PROCEDURES    ED DIAGNOSES     ICD-10-CM   1. Adverse effect of drug, initial encounter T50.905A   2. Bilateral lower extremity edema R60.0   3. Acute pain of left shoulder M25.512        Celes Dedic, Jonny Ruiz, MD 08/19/18 [phone]

## 2018-09-06 ENCOUNTER — Other Ambulatory Visit: Payer: Self-pay

## 2018-09-06 ENCOUNTER — Encounter (HOSPITAL_BASED_OUTPATIENT_CLINIC_OR_DEPARTMENT_OTHER): Payer: Self-pay | Admitting: *Deleted

## 2018-09-06 NOTE — Progress Notes (Addendum)
Spoke w/ pt via phone for pre-op interveiw.  Npo after mn w/ exception clear liquids until 0630 (no cream/milk products).  Arrive at 1030.  Current labs, cxr, and ekg in chart and epic.  Will take protonix am dos w/ sips of water.  Asked pt to bring rescue inhaler and cpap mask/ tubing.

## 2018-09-10 ENCOUNTER — Ambulatory Visit (HOSPITAL_BASED_OUTPATIENT_CLINIC_OR_DEPARTMENT_OTHER)
Admission: RE | Admit: 2018-09-10 | Discharge: 2018-09-10 | Disposition: A | Discharge status: Home / Self Care | Payer: Medicare Other | Source: Ambulatory Visit | Attending: Urology | Admitting: Urology

## 2018-09-10 ENCOUNTER — Encounter (HOSPITAL_BASED_OUTPATIENT_CLINIC_OR_DEPARTMENT_OTHER)
Admission: RE | Disposition: A | Discharge status: Home / Self Care | Payer: Self-pay | Source: Ambulatory Visit | Attending: Urology

## 2018-09-10 ENCOUNTER — Ambulatory Visit (HOSPITAL_BASED_OUTPATIENT_CLINIC_OR_DEPARTMENT_OTHER): Payer: Medicare Other | Admitting: Anesthesiology

## 2018-09-10 ENCOUNTER — Encounter (HOSPITAL_BASED_OUTPATIENT_CLINIC_OR_DEPARTMENT_OTHER): Payer: Self-pay

## 2018-09-10 DIAGNOSIS — Z79899 Other long term (current) drug therapy: Secondary | ICD-10-CM | POA: Diagnosis not present

## 2018-09-10 DIAGNOSIS — N138 Other obstructive and reflux uropathy: Secondary | ICD-10-CM | POA: Insufficient documentation

## 2018-09-10 DIAGNOSIS — K5909 Other constipation: Secondary | ICD-10-CM | POA: Insufficient documentation

## 2018-09-10 DIAGNOSIS — Z87891 Personal history of nicotine dependence: Secondary | ICD-10-CM | POA: Insufficient documentation

## 2018-09-10 DIAGNOSIS — I10 Essential (primary) hypertension: Secondary | ICD-10-CM | POA: Insufficient documentation

## 2018-09-10 DIAGNOSIS — K219 Gastro-esophageal reflux disease without esophagitis: Secondary | ICD-10-CM | POA: Insufficient documentation

## 2018-09-10 DIAGNOSIS — E785 Hyperlipidemia, unspecified: Secondary | ICD-10-CM | POA: Diagnosis not present

## 2018-09-10 DIAGNOSIS — G4733 Obstructive sleep apnea (adult) (pediatric): Secondary | ICD-10-CM | POA: Diagnosis not present

## 2018-09-10 DIAGNOSIS — J45909 Unspecified asthma, uncomplicated: Secondary | ICD-10-CM | POA: Insufficient documentation

## 2018-09-10 DIAGNOSIS — N401 Enlarged prostate with lower urinary tract symptoms: Secondary | ICD-10-CM | POA: Diagnosis not present

## 2018-09-10 HISTORY — DX: Presence of spectacles and contact lenses: Z97.3

## 2018-09-10 HISTORY — PX: CYSTOSCOPY WITH INSERTION OF UROLIFT: SHX6678

## 2018-09-10 HISTORY — DX: Iron deficiency anemia, unspecified: D50.9

## 2018-09-10 HISTORY — DX: Benign prostatic hyperplasia with lower urinary tract symptoms: N40.1

## 2018-09-10 HISTORY — DX: Dependence on other enabling machines and devices: Z99.89

## 2018-09-10 HISTORY — DX: Dry eye syndrome of bilateral lacrimal glands: H04.123

## 2018-09-10 HISTORY — DX: Personal history of other diseases of male genital organs: Z87.438

## 2018-09-10 HISTORY — DX: Presence of external hearing-aid: Z97.4

## 2018-09-10 HISTORY — DX: Other constipation: K59.09

## 2018-09-10 HISTORY — DX: Obstructive sleep apnea (adult) (pediatric): G47.33

## 2018-09-10 SURGERY — CYSTOSCOPY WITH INSERTION OF UROLIFT
Anesthesia: General | Site: Prostate

## 2018-09-10 MED ORDER — ACETAMINOPHEN 500 MG PO TABS
1000.0000 mg | ORAL_TABLET | Freq: Once | ORAL | Status: DC
  Filled 2018-09-10: qty 2

## 2018-09-10 MED ORDER — CEFAZOLIN SODIUM-DEXTROSE 2-4 GM/100ML-% IV SOLN
2.0000 g | INTRAVENOUS | Status: AC
  Administered 2018-09-10: 2 g via INTRAVENOUS
  Filled 2018-09-10: qty 100

## 2018-09-10 MED ORDER — ONDANSETRON HCL 4 MG/2ML IJ SOLN
INTRAMUSCULAR | Status: DC | PRN
  Administered 2018-09-10: 4 mg via INTRAVENOUS

## 2018-09-10 MED ORDER — TRAMADOL HCL 50 MG PO TABS: 50.0000 mg | ORAL_TABLET | Freq: Four times a day (QID) | ORAL | 0 refills | Status: AC | PRN

## 2018-09-10 MED ORDER — STERILE WATER FOR IRRIGATION IR SOLN
Status: DC | PRN
  Administered 2018-09-10: 3000 mL

## 2018-09-10 MED ORDER — FENTANYL CITRATE (PF) 100 MCG/2ML IJ SOLN
INTRAMUSCULAR | Status: AC
  Filled 2018-09-10: qty 2

## 2018-09-10 MED ORDER — KETOROLAC TROMETHAMINE 15 MG/ML IJ SOLN
15.0000 mg | Freq: Once | INTRAMUSCULAR | Status: DC | PRN
  Filled 2018-09-10: qty 1

## 2018-09-10 MED ORDER — GABAPENTIN 300 MG PO CAPS
ORAL_CAPSULE | ORAL | Status: AC
  Filled 2018-09-10: qty 1

## 2018-09-10 MED ORDER — GABAPENTIN 300 MG PO CAPS
300.0000 mg | ORAL_CAPSULE | Freq: Once | ORAL | Status: AC
Start: 2018-09-10 — End: 2018-09-10
  Administered 2018-09-10: 300 mg via ORAL
  Filled 2018-09-10: qty 1

## 2018-09-10 MED ORDER — ONDANSETRON HCL 4 MG/2ML IJ SOLN
4.0000 mg | Freq: Once | INTRAMUSCULAR | Status: DC | PRN
  Filled 2018-09-10: qty 2

## 2018-09-10 MED ORDER — FENTANYL CITRATE (PF) 100 MCG/2ML IJ SOLN
25.0000 ug | INTRAMUSCULAR | Status: DC | PRN
  Filled 2018-09-10: qty 1

## 2018-09-10 MED ORDER — ACETAMINOPHEN 500 MG PO TABS
ORAL_TABLET | ORAL | Status: AC
  Filled 2018-09-10: qty 2

## 2018-09-10 MED ORDER — ONDANSETRON HCL 4 MG/2ML IJ SOLN
INTRAMUSCULAR | Status: AC
  Filled 2018-09-10: qty 2

## 2018-09-10 MED ORDER — PROPOFOL 10 MG/ML IV BOLUS
INTRAVENOUS | Status: AC
  Filled 2018-09-10: qty 20

## 2018-09-10 MED ORDER — LACTATED RINGERS IV SOLN
INTRAVENOUS | Status: DC
  Administered 2018-09-10: 11:00:00 via INTRAVENOUS
  Filled 2018-09-10: qty 1000

## 2018-09-10 MED ORDER — LIDOCAINE 2% (20 MG/ML) 5 ML SYRINGE
INTRAMUSCULAR | Status: AC
  Filled 2018-09-10: qty 5

## 2018-09-10 MED ORDER — CEFAZOLIN SODIUM-DEXTROSE 2-4 GM/100ML-% IV SOLN
INTRAVENOUS | Status: AC
  Filled 2018-09-10: qty 100

## 2018-09-10 MED ORDER — DEXAMETHASONE SODIUM PHOSPHATE 10 MG/ML IJ SOLN
INTRAMUSCULAR | Status: DC | PRN
  Administered 2018-09-10: 10 mg via INTRAVENOUS

## 2018-09-10 MED ORDER — DEXAMETHASONE SODIUM PHOSPHATE 10 MG/ML IJ SOLN
INTRAMUSCULAR | Status: AC
  Filled 2018-09-10: qty 1

## 2018-09-10 MED ORDER — FENTANYL CITRATE (PF) 100 MCG/2ML IJ SOLN
INTRAMUSCULAR | Status: DC | PRN
  Administered 2018-09-10: 75 ug via INTRAVENOUS

## 2018-09-10 MED ORDER — WHITE PETROLATUM EX OINT
TOPICAL_OINTMENT | CUTANEOUS | Status: AC
  Filled 2018-09-10: qty 5

## 2018-09-10 MED ORDER — PROPOFOL 10 MG/ML IV BOLUS
INTRAVENOUS | Status: DC | PRN
  Administered 2018-09-10: 120 mg via INTRAVENOUS

## 2018-09-10 MED ORDER — LIDOCAINE 2% (20 MG/ML) 5 ML SYRINGE
INTRAMUSCULAR | Status: DC | PRN
  Administered 2018-09-10: 100 mg via INTRAVENOUS

## 2018-09-10 MED ORDER — ACETAMINOPHEN 500 MG PO TABS
1000.0000 mg | ORAL_TABLET | Freq: Once | ORAL | Status: AC
  Administered 2018-09-10: 1000 mg via ORAL
  Filled 2018-09-10: qty 2

## 2018-09-10 SURGICAL SUPPLY — 14 items
BAG DRAIN URO-CYSTO SKYTR STRL (DRAIN) ×3 IMPLANT
BAG URINE DRAINAGE (UROLOGICAL SUPPLIES) ×3 IMPLANT
CATH FOLEY 2WAY SLVR  5CC 16FR (CATHETERS) ×2
CATH FOLEY 2WAY SLVR 5CC 16FR (CATHETERS) ×1 IMPLANT
CLOTH BEACON ORANGE TIMEOUT ST (SAFETY) ×3 IMPLANT
GLOVE BIO SURGEON STRL SZ8 (GLOVE) ×3 IMPLANT
GOWN STRL REUS W/TWL XL LVL3 (GOWN DISPOSABLE) ×3 IMPLANT
HOLDER FOLEY CATH W/STRAP (MISCELLANEOUS) ×3 IMPLANT
IV NS IRRIG 3000ML ARTHROMATIC (IV SOLUTION) ×6 IMPLANT
MANIFOLD NEPTUNE II (INSTRUMENTS) ×3 IMPLANT
PACK CYSTO (CUSTOM PROCEDURE TRAY) ×3 IMPLANT
SYSTEM UROLIFT (Male Continence) ×18 IMPLANT
TUBE CONNECTING 12'X1/4 (SUCTIONS) ×1
TUBE CONNECTING 12X1/4 (SUCTIONS) ×2 IMPLANT

## 2018-09-10 NOTE — Anesthesia Preprocedure Evaluation (Addendum)
Anesthesia Evaluation  Patient identified by MRN, date of birth, ID band Patient awake    Reviewed: Allergy & Precautions, H&P , NPO status , Patient's Chart, lab work & pertinent test results  Airway Mallampati: II  TM Distance: >3 FB Neck ROM: Full    Dental no notable dental hx. (+) Teeth Intact, Dental Advisory Given   Pulmonary asthma , sleep apnea , former smoker,    Pulmonary exam normal breath sounds clear to auscultation       Cardiovascular Exercise Tolerance: Good hypertension, Pt. on medications negative cardio ROS Normal cardiovascular exam Rhythm:Regular Rate:Normal     Neuro/Psych    GI/Hepatic negative GI ROS, Neg liver ROS, GERD  ,  Endo/Other  negative endocrine ROS  Renal/GU negative Renal ROS     Musculoskeletal negative musculoskeletal ROS (+)   Abdominal   Peds  Hematology  (+) Blood dyscrasia, anemia ,   Anesthesia Other Findings   Reproductive/Obstetrics                           Anesthesia Physical Anesthesia Plan  ASA: III  Anesthesia Plan: General   Post-op Pain Management:    Induction: Intravenous  PONV Risk Score and Plan: 2 and Treatment may vary due to age or medical condition and Ondansetron  Airway Management Planned: LMA  Additional Equipment:   Intra-op Plan:   Post-operative Plan:   Informed Consent: I have reviewed the patients History and Physical, chart, labs and discussed the procedure including the risks, benefits and alternatives for the proposed anesthesia with the patient or authorized representative who has indicated his/her understanding and acceptance.   Dental advisory given  Plan Discussed with: CRNA  Anesthesia Plan Comments:         Anesthesia Quick Evaluation

## 2018-09-10 NOTE — Transfer of Care (Signed)
Immediate Anesthesia Transfer of Care Note  Patient: Wesley Weber  Procedure(s) Performed: Procedure(s) (LRB): CYSTOSCOPY WITH INSERTION OF UROLIFT (N/A)  Patient Location: PACU  Anesthesia Type: General  Level of Consciousness: awake, oriented, sedated and patient cooperative  Airway & Oxygen Therapy: Patient Spontanous Breathing and Patient connected to face mask oxygen  Post-op Assessment: Report given to PACU RN and Post -op Vital signs reviewed and stable  Post vital signs: Reviewed and stable  Complications: No apparent anesthesia complications  Last Vitals:  Vitals Value Taken Time  BP 140/71 09/10/2018 12:20 PM  Temp 36.8 C 09/10/2018 12:20 PM  Pulse 49 09/10/2018 12:23 PM  Resp 12 09/10/2018 12:23 PM  SpO2 100 % 09/10/2018 12:23 PM  Vitals shown include unvalidated device data.  Last Pain:  Vitals:   09/10/18 1041  TempSrc:   PainSc: 0-No pain      Patients Stated Pain Goal: 4 (09/10/18 1041)

## 2018-09-10 NOTE — H&P (Signed)
Urology Admission H&P  Chief Complaint: incomplete emptying  History of Present Illness: [name] is a 72yo with a hx of BPH with incomplete emptying who is here today for Urolift. He has severe LUTS and has failed medical therapy. No other issues  Past Medical History:  Diagnosis Date  . Asthma   . Benign localized prostatic hyperplasia with lower urinary tract symptoms (LUTS)   . Chronic constipation   . Chronic dryness of both eyes   . GERD (gastroesophageal reflux disease)   . History of prostatitis   . Hyperlipidemia   . Hypertension   . IDA (iron deficiency anemia)   . OSA on CPAP   . Wears glasses   . Wears hearing aid in both ears    Past Surgical History:  Procedure Laterality Date  . ANKLE ARTHRODESIS Right 05/27/2008  . CYSTOSCOPY WITH INSERTION OF UROLIFT  05/14/2018  _0  [facility] (MAC anesthesia)  . KNEE ARTHROSCOPY W/ MENISCAL REPAIR Left 01-22-2015  _1     Home Medications:  Current Facility-Administered Medications  Medication Dose Route Frequency Provider Last Rate Last Dose  . ceFAZolin (ANCEF) IVPB 2g/100 mL premix  2 g Intravenous 30 min Pre-Op Alyson Ingles Candee Furbish, MD      . lactated ringers infusion   Intravenous Continuous Barnet Glasgow, MD 50 mL/hr at 09/10/18 1056     Allergies:  Allergies  Allergen Reactions  . Ciprofloxacin Itching  . Minoxidil Swelling  . Omeprazole Rash    Family History  Problem Relation Age of Onset  . Diabetes Mother   . Cancer Father    Social History:  reports that he quit smoking about 32 years ago. His smoking use included cigarettes. He quit after 10.00 years of use. He has never used smokeless tobacco. He reports that he drank alcohol. He reports that he does not use drugs.  Review of Systems  All other systems reviewed and are negative.   Physical Exam:  Vital signs in last 24 hours: Temp:  [97.1 F (36.2 C)] 97.1 F (36.2 C) (10/21 1022) Pulse Rate:  [50] 50 (10/21 1022) Resp:  [16] 16  (10/21 1022) BP: (151)/(73) 151/73 (10/21 1022) SpO2:  [100 %] 100 % (10/21 1022) Weight:  [104.1 kg] 104.1 kg (10/21 1022) Physical Exam  Constitutional: He appears well-developed and well-nourished.  HENT:  Head: Normocephalic and atraumatic.  Eyes: Pupils are equal, round, and reactive to light. EOM are normal.  Neck: Normal range of motion. No thyromegaly present.  Cardiovascular: Normal rate and regular rhythm.  Respiratory: Effort normal. No respiratory distress.  GI: Soft. He exhibits no distension.  Musculoskeletal: Normal range of motion. He exhibits no edema.  Neurological: He is alert. No cranial nerve deficit.  Skin: Skin is warm and dry.  Psychiatric: He has a normal mood and affect. His behavior is normal. Judgment and thought content normal.    Laboratory Data:  No results found for this or any previous visit (from the past 24 hour(s)). No results found for this or any previous visit (from the past 240 hour(s)). Creatinine: No results for input(s): CREATININE in the last 168 hours. Baseline Creatinine: unknown  Impression/Assessment:  72yo with BPH with LUTS, incomplete emptying  Plan:  The risks/benefits/alternatives to Urolift was explained to the patient and he understands and wishes to proceed with surgery  Nicolette Bang 09/10/2018, 11:27 AM

## 2018-09-10 NOTE — Op Note (Signed)
   PREOPERATIVE DIAGNOSIS: Benign prostatic hypertrophy with bladder outlet obstruction with incomplete emptying.  POSTOPERATIVE DIAGNOSIS: Benign prostatic hypertrophy with bladder outlet obstruction with incomplete emptyin.  PROCEDURE: Cystoscopy with implantation of UroLift devices, 6 implants.  SURGEON: Wilkie Aye, M.D.  ANESTHESIA: General  ANTIBIOTICS: ancef  SPECIMEN: None.  DRAINS: A 16-French Foley catheter.  BLOOD LOSS: Minimal.  COMPLICATIONS: None.  INDICATIONS:The Patient is an 72 year old white male with BPH and bladder outlet obstruction. He has failed medical therapy and has elected UroLift for definitive treatment.  FINDINGS OF PROCEDURE: He was taken to the operating room where a genral anesthetic was induced. He was placed in lithotomy position and was fitted with PAS hose. His perineum and genitalia were prepped with chlorhexidine, and he was draped in usual sterile fashion.  Cystoscopy was performed using the UroLift scope and 0 degree lens. Examination revealed a normal urethra. The external sphincter was intact. Prostatic urethra was approximately 6 cm in length with lateral lobe enlargement. There was also little bit of bladder neck elevation. Inspection of bladder revealed mild-to-moderate trabeculation with no tumors, stones, or inflammation. No cellules or diverticula were noted. Ureteral orifices were in their normal anatomic position effluxing clear urine.  After initial cystoscopy, the visual obturator was replaced with the first UroLift device. This was turned to the 9 o'clock position and pulled back to the veru and then slightly advanced. Pressure was then applied to the right lateral lobe and the UroLift device was deployed.  The second UroLift device was then inserted and applied to the left lateral lobe at 3 o'clock and deployed in the mid prostatic urethra. After this, there was still some apparent  obstruction closer to the bladder neck. So a second level of UroLift your left device was applied between the mid urethra and the proximal urethra providing further patency to the prostatic urethra. At this point, there was mild bleeding but the patient did have a spinal anesthetic. So it was thought that a Foley catheter was indicated. The scope was removed and a 16-French Foley catheter was inserted without difficulty. The balloon was filled with 10 mL sterile fluid, and the catheter was placed to straight drainage.  COMPLICATIONS: None   CONDITION: Stable, extubated, transferred to PACU  PLAN: The patient will be discharged home and followup in 2 days for a voiding trial.

## 2018-09-10 NOTE — Anesthesia Postprocedure Evaluation (Signed)
Anesthesia Post Note  Patient: Wesley Weber  Procedure(s) Performed: CYSTOSCOPY WITH INSERTION OF UROLIFT (N/A Prostate)     Patient location during evaluation: PACU Anesthesia Type: General Level of consciousness: awake and alert Pain management: pain level controlled Vital Signs Assessment: post-procedure vital signs reviewed and stable Respiratory status: spontaneous breathing, nonlabored ventilation, respiratory function stable and patient connected to nasal cannula oxygen Cardiovascular status: blood pressure returned to baseline and stable Postop Assessment: no apparent nausea or vomiting Anesthetic complications: no    Last Vitals:  Vitals:   09/10/18 1245 09/10/18 1335  BP: 136/61 (!) 155/80  Pulse: (!) 43 (!) 48  Resp: 14 14  Temp:  36.6 C  SpO2: 98% 100%    Last Pain:  Vitals:   09/10/18 1335  TempSrc: Oral  PainSc: 0-No pain                 Trevor Iha

## 2018-09-10 NOTE — Discharge Instructions (Signed)
Indwelling Urinary Catheter Care, Adult °Take good care of your catheter to keep it working and to prevent problems. °How to wear your catheter °Attach your catheter to your leg with tape (adhesive tape) or a leg strap. Make sure it is not too tight. If you use tape, remove any bits of tape that are already on the catheter. °How to wear a drainage bag °You should have: °· A large overnight bag. °· A small leg bag. ° °Overnight Bag °You may wear the overnight bag at any time. Always keep the bag below the level of your bladder but off the floor. When you sleep, put a clean plastic bag in a wastebasket. Then hang the bag inside the wastebasket. °Leg Bag °Never wear the leg bag at night. Always wear the leg bag below your knee. Keep the leg bag secure with a leg strap or tape. °How to care for your skin °· Clean the skin around the catheter at least once every day. °· Shower every day. Do not take baths. °· Put creams, lotions, or ointments on your genital area only as told by your doctor. °· Do not use powders, sprays, or lotions on your genital area. °How to clean your catheter and your skin °1. Wash your hands with soap and water. °2. Wet a washcloth in warm water and gentle (mild) soap. °3. Use the washcloth to clean the skin where the catheter enters your body. Clean downward and wipe away from the catheter in small circles. Do not wipe toward the catheter. °4. Pat the area dry with a clean towel. Make sure to clean off all soap. °How to care for your drainage bags °Empty your drainage bag when it is ?-½ full or at least 2-3 times a day. Replace your drainage bag once a month or sooner if it starts to smell bad or look dirty. Do not clean your drainage bag unless told by your doctor. °Emptying a drainage bag ° °Supplies Needed °· Rubbing alcohol. °· Gauze pad or cotton ball. °· Tape or a leg strap. ° °Steps °1. Wash your hands with soap and water. °2. Separate (detach) the bag from your leg. °3. Hold the bag over  the toilet or a clean container. Keep the bag below your hips and bladder. This stops pee (urine) from going back into the tube. °4. Open the pour spout at the bottom of the bag. °5. Empty the pee into the toilet or container. Do not let the pour spout touch any surface. °6. Put rubbing alcohol on a gauze pad or cotton ball. °7. Use the gauze pad or cotton ball to clean the pour spout. °8. Close the pour spout. °9. Attach the bag to your leg with tape or a leg strap. °10. Wash your hands. ° °Changing a drainage bag °Supplies Needed °· Alcohol wipes. °· A clean drainage bag. °· Adhesive tape or a leg strap. ° °Steps °1. Wash your hands with soap and water. °2. Separate the dirty bag from your leg. °3. Pinch the rubber catheter with your fingers so that pee does not spill out. °4. Separate the catheter tube from the drainage tube where these tubes connect (at the connection valve). Do not let the tubes touch any surface. °5. Clean the end of the catheter tube with an alcohol wipe. Use a different alcohol wipe to clean the end of the drainage tube. °6. Connect the catheter tube to the drainage tube of the clean bag. °7. Attach the new bag to   the leg with adhesive tape or a leg strap. °8. Wash your hands. ° °How to prevent infection and other problems °· Never pull on your catheter or try to remove it. Pulling can damage tissue in your body. °· Always wash your hands before and after touching your catheter. °· If a leg strap gets wet, replace it with a dry one. °· Drink enough fluids to keep your pee clear or pale yellow, or as told by your doctor. °· Do not let the drainage bag or tubing touch the floor. °· Wear cotton underwear. °· If you are male, wipe from front to back after you poop (have a bowel movement). °· Check on the catheter often to make sure it works and the tubing is not twisted. °Get help if: °· Your pee is cloudy. °· Your pee smells unusually bad. °· Your pee is not draining into the bag. °· Your  tube gets clogged. °· Your catheter starts to leak. °· Your bladder feels full. °Get help right away if: °· You have redness, swelling, or pain where the catheter enters your body. °· You have fluid, pus, or a bad smell coming from the area where the catheter enters your body. °· The area where the catheter enters your body feels warm. °· You have a fever. °· You have pain in your: °? Stomach (abdomen). °? Legs. °? Lower back. °? Bladder. °· You see blood fill the catheter. °· Your pee is pink or red. °· You feel sick to your stomach (nauseous). °· You throw up (vomit). °· You have chills. °· Your catheter gets pulled out. °This information is not intended to replace advice given to you by your health care provider. Make sure you discuss any questions you have with your health care provider. °Document Released: 03/04/2013 Document Revised: 10/05/2016 Document Reviewed: 04/22/2014 °Elsevier Interactive Patient Education © 2018 [employer] ° ° °Post Anesthesia Home Care Instructions ° °Activity: °Get plenty of rest for the remainder of the day. A responsible individual must stay with you for 24 hours following the procedure.  °For the next 24 hours, DO NOT: °-Drive a car °-Operate machinery °-Drink alcoholic beverages °-Take any medication unless instructed by your physician °-Make any legal decisions or sign important papers. ° °Meals: °Start with liquid foods such as gelatin or soup. Progress to regular foods as tolerated. Avoid greasy, spicy, heavy foods. If nausea and/or vomiting occur, drink only clear liquids until the nausea and/or vomiting subsides. Call your physician if vomiting continues. ° °Special Instructions/Symptoms: °Your throat may feel dry or sore from the anesthesia or the breathing tube placed in your throat during surgery. If this causes discomfort, gargle with warm salt water. The discomfort should disappear within 24 hours. ° ° °  ° ° °

## 2018-09-10 NOTE — Anesthesia Procedure Notes (Signed)
Procedure Name: LMA Insertion Date/Time: 09/10/2018 11:54 AM Performed by: Francie Massing, CRNA Pre-anesthesia Checklist: Patient identified, Emergency Drugs available, Suction available and Patient being monitored Patient Re-evaluated:Patient Re-evaluated prior to induction Oxygen Delivery Method: Circle system utilized Preoxygenation: Pre-oxygenation with 100% oxygen Induction Type: IV induction Ventilation: Mask ventilation without difficulty LMA: LMA inserted LMA Size: 4.0 Number of attempts: 1 Airway Equipment and Method: Bite block Placement Confirmation: positive ETCO2 Tube secured with: Tape Dental Injury: Teeth and Oropharynx as per pre-operative assessment

## 2018-09-11 ENCOUNTER — Encounter (HOSPITAL_BASED_OUTPATIENT_CLINIC_OR_DEPARTMENT_OTHER): Payer: Self-pay | Admitting: Urology

## 2018-09-12 ENCOUNTER — Emergency Department (HOSPITAL_COMMUNITY)
Admission: EM | Admit: 2018-09-12 | Discharge: 2018-09-12 | Disposition: A | Discharge status: Home / Self Care | Payer: Medicare Other | Attending: Emergency Medicine | Admitting: Emergency Medicine

## 2018-09-12 DIAGNOSIS — T839XXA Unspecified complication of genitourinary prosthetic device, implant and graft, initial encounter: Secondary | ICD-10-CM | POA: Diagnosis present

## 2018-09-12 DIAGNOSIS — Y658 Other specified misadventures during surgical and medical care: Secondary | ICD-10-CM | POA: Diagnosis not present

## 2018-09-12 DIAGNOSIS — R339 Retention of urine, unspecified: Secondary | ICD-10-CM | POA: Diagnosis not present

## 2018-09-12 LAB — URINALYSIS, ROUTINE W REFLEX MICROSCOPIC
Bilirubin Urine: NEGATIVE
Glucose, UA: NEGATIVE mg/dL
KETONES UR: NEGATIVE mg/dL
NITRITE: NEGATIVE
PH: 6 (ref 5.0–8.0)
Protein, ur: 100 mg/dL — AB
RBC / HPF: >50 RBC/hpf — ABNORMAL HIGH (ref 0–5)
SPECIFIC GRAVITY, URINE: 1.006 (ref 1.005–1.030)
WBC, UA: >50 WBC/hpf — ABNORMAL HIGH (ref 0–5)

## 2018-09-12 NOTE — ED Triage Notes (Signed)
Pt has had his catheter since Monday and it was due to come out this am at 10, he states that it's been leaking urine all around the catheter since 2330 last night

## 2018-09-12 NOTE — ED Provider Notes (Addendum)
[facility] DEPT Provider Note   CSN: [id number] Arrival date & time: 09/12/18  0159     History   Chief Complaint No chief complaint on file.   HPI Wesley Weber is a 72 y.o. male.  Patient presents to the emergency department for evaluation of Foley catheter issue.  Patient has a Foley catheter in place after urolift procedure 2 days ago.  He reports tonight that he has been leaking urine around the catheter but has not been seeing any urine into the bag.  He feels uncomfortable.  He denies any trauma to the Foley.     Past Medical History:  Diagnosis Date  . Asthma   . Benign localized prostatic hyperplasia with lower urinary tract symptoms (LUTS)   . Chronic constipation   . Chronic dryness of both eyes   . GERD (gastroesophageal reflux disease)   . History of prostatitis   . Hyperlipidemia   . Hypertension   . IDA (iron deficiency anemia)   . OSA on CPAP   . Wears glasses   . Wears hearing aid in both ears     Patient Active Problem List   Diagnosis Date Noted  . Urinary retention 02/04/2018    Past Surgical History:  Procedure Laterality Date  . ANKLE ARTHRODESIS Right 05/27/2008  . CYSTOSCOPY WITH INSERTION OF UROLIFT  05/14/2018  _0  [facility] (MAC anesthesia)  . CYSTOSCOPY WITH INSERTION OF UROLIFT N/A 09/10/2018   Procedure: CYSTOSCOPY WITH INSERTION OF UROLIFT;  Surgeon: Cleon Gustin, MD;  [facility];  Service: Urology;  Laterality: N/A;  . KNEE ARTHROSCOPY W/ MENISCAL REPAIR Left 01-22-2015  _1         Home Medications    Prior to Admission medications   Medication Sig Start Date End Date Taking? Authorizing Provider  acetaminophen (TYLENOL) 325 MG tablet Take 325 mg by mouth every 6 (six) hours as needed for mild pain.   Yes [provider]  albuterol (PROVENTIL HFA;VENTOLIN HFA) 108 (90 Base) MCG/ACT inhaler Inhale 2 puffs into the lungs every 4 (four) hours as  needed for wheezing or shortness of breath.   Yes [provider]  aspirin EC 81 MG tablet Take 81 mg by mouth daily.   Yes [provider]  chlorpheniramine (CHLOR-TRIMETON) 4 MG tablet Take 4 mg by mouth daily as needed for allergies.   Yes [provider]  Cholecalciferol (VITAMIN D) 2000 units CAPS Take 2,000 Units by mouth daily.    Yes [provider]  cloNIDine (CATAPRES - DOSED IN MG/24 HR) 0.2 mg/24hr patch Place 0.2 mg onto the skin every Wednesday.    Yes [provider]  docusate sodium (COLACE) 100 MG capsule Take 100 mg by mouth 2 (two) times daily as needed for mild constipation.   Yes [provider]  ferrous fumarate-iron polysaccharide complex (TANDEM) 162-115.2 MG CAPS Take 1 capsule by mouth daily with supper.    Yes [provider]  mometasone (ASMANEX) 220 MCG/INH inhaler Inhale 2 puffs into the lungs at bedtime.    Yes [provider]  pantoprazole (PROTONIX) 40 MG tablet Take 40 mg by mouth every morning.  12/20/17  Yes [provider]  Polyethyl Glycol-Propyl Glycol (SYSTANE OP) Apply 1 drop to eye as needed (dry eyes).    Yes [provider]  tamsulosin (FLOMAX) 0.4 MG CAPS capsule Take 0.8 mg by mouth daily at 12 noon.    Yes [provider]  traMADol (  ULTRAM) 50 MG tablet Take 1 tablet (50 mg total) by mouth every 6 (six) hours as needed. Patient taking differently: Take 50 mg by mouth every 6 (six) hours as needed for moderate pain.  09/10/18 09/10/19 Yes McKenzie, Candee Furbish, MD  tadalafil (ADCIRCA/CIALIS) 20 MG tablet Take 20 mg by mouth daily as needed for erectile dysfunction.    [provider]    Family History Family History  Problem Relation Age of Onset  . Diabetes Mother   . Cancer Father     Social History Social History   Tobacco Use  . Smoking status: Former Smoker    Years: 10.00    Types: Cigarettes    Last attempt to quit: 09/06/1986     Years since quitting: 32.0  . Smokeless tobacco: Never Used  Substance Use Topics  . Alcohol use: Not Currently  . Drug use: Never     Allergies   Ciprofloxacin; Minoxidil; Hydralazine; and Omeprazole   Review of Systems Review of Systems  Genitourinary: Positive for decreased urine volume.  All other systems reviewed and are negative.    Physical Exam Updated Vital Signs BP (!) 156/81 (BP Location: Right Arm)   Pulse 74   Resp 18   SpO2 100%   Physical Exam  Constitutional: He is oriented to person, place, and time. He appears well-developed and well-nourished. No distress.  HENT:  Head: Normocephalic and atraumatic.  Right Ear: Hearing normal.  Left Ear: Hearing normal.  Nose: Nose normal.  Mouth/Throat: Oropharynx is clear and moist and mucous membranes are normal.  Eyes: Pupils are equal, round, and reactive to light. Conjunctivae and EOM are normal.  Neck: Normal range of motion. Neck supple.  Cardiovascular: Regular rhythm, S1 normal and S2 normal. Exam reveals no gallop and no friction rub.  No murmur heard. Pulmonary/Chest: Effort normal and breath sounds normal. No respiratory distress. He exhibits no tenderness.  Abdominal: Soft. Normal appearance and bowel sounds are normal. There is no hepatosplenomegaly. There is no tenderness. There is no rebound, no guarding, no tenderness at McBurney's point and negative Murphy's sign. No hernia.  Musculoskeletal: Normal range of motion.  Neurological: He is alert and oriented to person, place, and time. He has normal strength. No cranial nerve deficit or sensory deficit. Coordination normal. GCS eye subscore is 4. GCS verbal subscore is 5. GCS motor subscore is 6.  Skin: Skin is warm, dry and intact. No rash noted. No cyanosis.  Psychiatric: He has a normal mood and affect. His speech is normal and behavior is normal. Thought content normal.  Nursing note and vitals reviewed.    ED Treatments / Results  Labs (all  labs ordered are listed, but only abnormal results are displayed) Labs Reviewed  URINALYSIS, ROUTINE W REFLEX MICROSCOPIC    EKG None  Radiology No results found.  Procedures Procedures (including critical care time)  Medications Ordered in ED Medications - No data to display   Initial Impression / Assessment and Plan / ED Course  I have reviewed the triage vital signs and the nursing notes.  Pertinent labs & imaging results that were available during my care of the patient were reviewed by me and considered in my medical decision making (see chart for details).     Patient has a Foley catheter in place that has stopped draining into the back, has some overflow urine coming out around the catheter.  Patient has an appointment at 10 AM today to have the Foley catheter removed at  [facility].  Catheter was therefore removed here in the ER and he has been able to urinate.  Patient will be discharged, follow-up with [facility] in the office today as scheduled to ensure that he is emptying his bladder and does not need replacement of the catheter.  Urine culture has been sent.  Final Clinical Impressions(s) / ED Diagnoses   Final diagnoses:  Foley catheter problem, initial encounter [facility])    ED Discharge Orders    None       Cierra Rothgeb, Gwenyth Allegra, MD 09/12/18 0335    Orpah Greek, MD 09/12/18 [phone]

## 2018-09-12 NOTE — Discharge Instructions (Addendum)
Follow-up at 10 AM today with urology as is already scheduled.

## 2018-09-12 NOTE — ED Notes (Signed)
Bladder scan 79 cc's

## 2018-09-13 LAB — URINE CULTURE: CULTURE: NO GROWTH

## 2018-09-13 MED ORDER — SODIUM CHLORIDE 0.9 % IJ SOLN
INTRAMUSCULAR | Status: AC
  Filled 2018-09-13: qty 50

## 2018-09-21 DEATH — deceased

## 2018-12-23 ENCOUNTER — Emergency Department (HOSPITAL_COMMUNITY)
Admission: EM | Admit: 2018-12-23 | Discharge: 2018-12-23 | Disposition: A | Discharge status: Home / Self Care | Payer: Medicare Other | Attending: Emergency Medicine | Admitting: Emergency Medicine

## 2018-12-23 ENCOUNTER — Emergency Department (HOSPITAL_COMMUNITY): Payer: Medicare Other

## 2018-12-23 ENCOUNTER — Other Ambulatory Visit: Payer: Self-pay

## 2018-12-23 ENCOUNTER — Encounter (HOSPITAL_COMMUNITY): Payer: Self-pay | Admitting: Emergency Medicine

## 2018-12-23 DIAGNOSIS — R079 Chest pain, unspecified: Secondary | ICD-10-CM | POA: Insufficient documentation

## 2018-12-23 DIAGNOSIS — I10 Essential (primary) hypertension: Secondary | ICD-10-CM | POA: Diagnosis not present

## 2018-12-23 DIAGNOSIS — Z7982 Long term (current) use of aspirin: Secondary | ICD-10-CM | POA: Diagnosis not present

## 2018-12-23 DIAGNOSIS — Z87891 Personal history of nicotine dependence: Secondary | ICD-10-CM | POA: Insufficient documentation

## 2018-12-23 DIAGNOSIS — J45909 Unspecified asthma, uncomplicated: Secondary | ICD-10-CM | POA: Diagnosis not present

## 2018-12-23 DIAGNOSIS — Z79899 Other long term (current) drug therapy: Secondary | ICD-10-CM | POA: Insufficient documentation

## 2018-12-23 DIAGNOSIS — R0789 Other chest pain: Secondary | ICD-10-CM

## 2018-12-23 DIAGNOSIS — R202 Paresthesia of skin: Secondary | ICD-10-CM | POA: Diagnosis not present

## 2018-12-23 LAB — CBC
HCT: 43.8 % (ref 39.0–52.0)
Hemoglobin: 14 g/dL (ref 13.0–17.0)
MCH: 29.2 pg (ref 26.0–34.0)
MCHC: 32 g/dL (ref 30.0–36.0)
MCV: 91.4 fL (ref 80.0–100.0)
NRBC: 0 % (ref 0.0–0.2)
PLATELETS: 197 10*3/uL (ref 150–400)
RBC: 4.79 MIL/uL (ref 4.22–5.81)
RDW: 14.9 % (ref 11.5–15.5)
WBC: 4.1 10*3/uL (ref 4.0–10.5)

## 2018-12-23 LAB — POCT I-STAT TROPONIN I
TROPONIN I, POC: 0.01 ng/mL (ref 0.00–0.08)
Troponin i, poc: 0.01 ng/mL (ref 0.00–0.08)

## 2018-12-23 LAB — BASIC METABOLIC PANEL
Anion gap: 5 (ref 5–15)
BUN: 16 mg/dL (ref 8–23)
CALCIUM: 9.6 mg/dL (ref 8.9–10.3)
CO2: 29 mmol/L (ref 22–32)
CREATININE: 1.29 mg/dL — AB (ref 0.61–1.24)
Chloride: 107 mmol/L (ref 98–111)
GFR, EST NON AFRICAN AMERICAN: 54 mL/min — AB (ref >60)
Glucose, Bld: 104 mg/dL — ABNORMAL HIGH (ref 70–99)
Potassium: 4.2 mmol/L (ref 3.5–5.1)
Sodium: 141 mmol/L (ref 135–145)

## 2018-12-23 MED ORDER — SODIUM CHLORIDE 0.9% FLUSH
3.0000 mL | Freq: Once | INTRAVENOUS | Status: AC
  Administered 2018-12-23: 3 mL via INTRAVENOUS

## 2018-12-23 NOTE — ED Provider Notes (Signed)
[facility] DEPT Provider Note   CSN: [id number] Arrival date & time: 12/23/18  1244     History   Chief Complaint Chief Complaint  Patient presents with  . Chest Pain  . Hand Pain  . Numbness    HPI Wesley Weber is a 73 y.o. male with history of asthma, hypertension, hyperlipidemia, anemia is here for evaluation of pain in his chest.  This began 2 days ago.  It is described as a dull, mild ache.  Located to the right upper lateral chest with radiation into the anterior shoulder.  He first noticed that when he was sitting down.  The pain lasts approximately 10 minutes.  He is only noticed the pain when he is still and when he is laying back on his bed and trying to roll on his right side.  He has not noticed it with exertion.  This happened a couple of months ago as well but it resolved and he did not get it checked out.  Associated symptoms include tingling in his lower lip, bilateral arms from his elbow down.  The tingling also began 2 days ago and happens with the chest discomfort but lingers longer than the chest pain.  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  No recent direct trauma, exercise activity. He does water aerobics three times a week.  His mother had an MI in her 27s and died from it.  No personal history of CAD.  Remote history of tobacco greater than 30 years ago.  Last time he felt the chest discomfort was 5 minutes before I came into the room.  He denies associated fevers, cough, shortness of breath, lightheadedness, orthopnea, lower extremity edema, calf pain or swelling.  No recent prolonged travel, surgeries, immobilization, history of DVT/PE.  No interventions.  No alleviating factors.  HPI  Past Medical History:  Diagnosis Date  . Asthma   . Benign localized prostatic hyperplasia with lower urinary tract symptoms (LUTS)   . Chronic constipation   .  Chronic dryness of both eyes   . GERD (gastroesophageal reflux disease)   . History of prostatitis   . Hyperlipidemia   . Hypertension   . IDA (iron deficiency anemia)   . OSA on CPAP   . Wears glasses   . Wears hearing aid in both ears     Patient Active Problem List   Diagnosis Date Noted  . Urinary retention 02/04/2018    Past Surgical History:  Procedure Laterality Date  . ANKLE ARTHRODESIS Right 05/27/2008  . CYSTOSCOPY WITH INSERTION OF UROLIFT  05/14/2018  _0  [facility] (MAC anesthesia)  . CYSTOSCOPY WITH INSERTION OF UROLIFT N/A 09/10/2018   Procedure: CYSTOSCOPY WITH INSERTION OF UROLIFT;  Surgeon: Cleon Gustin, MD;  Location: [facility];  Service: Urology;  Laterality: N/A;  . KNEE ARTHROSCOPY W/ MENISCAL REPAIR Left 01-22-2015  _1         Home Medications    Prior to Admission medications   Medication Sig Start Date End Date Taking? Authorizing Provider  albuterol (PROVENTIL HFA;VENTOLIN HFA) 108 (90 Base) MCG/ACT inhaler Inhale 2 puffs into the lungs every 4 (four) hours as needed for wheezing or shortness of breath.   Yes [provider]  aspirin EC 81 MG tablet Take 81 mg by mouth daily.   Yes [provider]  atorvastatin (LIPITOR) 40 MG tablet Take 20 mg by  mouth daily at 6 PM.   Yes [provider]  chlorpheniramine (CHLOR-TRIMETON) 4 MG tablet Take 4 mg by mouth daily as needed for allergies.   Yes [provider]  Cholecalciferol (VITAMIN D) 2000 units CAPS Take 2,000 Units by mouth daily.    Yes [provider]  ferrous sulfate 325 (65 FE) MG tablet Take 325 mg by mouth daily with breakfast.   Yes [provider]  mometasone (ASMANEX) 220 MCG/INH inhaler Inhale 2 puffs into the lungs at bedtime.    Yes [provider]  pantoprazole (PROTONIX) 40 MG tablet Take 40 mg by mouth every morning.  12/20/17  Yes [provider]  Polyethyl Glycol-Propyl Glycol  (SYSTANE OP) Apply 1 drop to eye as needed (dry eyes).    Yes [provider]  polyethylene glycol (MIRALAX / GLYCOLAX) packet Take 17 g by mouth daily.   Yes [provider]  tadalafil (ADCIRCA/CIALIS) 20 MG tablet Take 20 mg by mouth daily as needed for erectile dysfunction.   Yes [provider]  valsartan (DIOVAN) 160 MG tablet Take 160 mg by mouth daily.   Yes [provider]  traMADol (ULTRAM) 50 MG tablet Take 1 tablet (50 mg total) by mouth every 6 (six) hours as needed. Patient not taking: Reported on 12/23/2018 09/10/18 09/10/19  Cleon Gustin, MD    Family History Family History  Problem Relation Age of Onset  . Diabetes Mother   . Cancer Father     Social History Social History   Tobacco Use  . Smoking status: Former Smoker    Years: 10.00    Types: Cigarettes    Last attempt to quit: 09/06/1986    Years since quitting: 32.3  . Smokeless tobacco: Never Used  Substance Use Topics  . Alcohol use: Not Currently  . Drug use: Never     Allergies   Ciprofloxacin; Lisinopril; Minoxidil; Hydralazine; and Omeprazole   Review of Systems Review of Systems  Cardiovascular: Positive for chest pain.  Musculoskeletal: Positive for arthralgias.  Neurological:       Paresthesias   All other systems reviewed and are negative.    Physical Exam Updated Vital Signs BP (!) 143/80   Pulse (!) 57   Temp 98.4 F (36.9 C) (Oral)   Resp 11   SpO2 100%   Physical Exam Constitutional:      Appearance: He is well-developed.     Comments: NAD. Non toxic.   HENT:     Head: Normocephalic and atraumatic.     Nose: Nose normal.  Eyes:     General: Lids are normal.     Conjunctiva/sclera: Conjunctivae normal.  Neck:     Musculoskeletal: Normal range of motion.     Trachea: Trachea normal.  Cardiovascular:     Rate and Rhythm: Normal rate and regular rhythm.     Pulses:          Radial pulses are 2+ on the right side and 2+ on the  left side.       Dorsalis pedis pulses are 2+ on the right side and 2+ on the left side.     Heart sounds: Normal heart sounds, S1 normal and S2 normal.     Comments: No LE edema or calf tenderness.  Pulmonary:     Effort: Pulmonary effort is normal.     Breath sounds: Normal breath sounds.     Comments: No reproducible chest wall tenderness.  No pain noted with inspiration.  Abdominal:     General: Bowel sounds are normal.     Palpations: Abdomen is soft.     Tenderness: There is no abdominal tenderness.     Comments: No epigastric tenderness. No distention.   Skin:    General: Skin is warm and dry.     Capillary Refill: Capillary refill takes less than 2 seconds.     Comments: No rash to chest wall  Neurological:     Mental Status: He is alert.     GCS: GCS eye subscore is 4. GCS verbal subscore is 5. GCS motor subscore is 6.  Psychiatric:        Speech: Speech normal.        Behavior: Behavior normal.        Thought Content: Thought content normal.      ED Treatments / Results  Labs (all labs ordered are listed, but only abnormal results are displayed) Labs Reviewed  BASIC METABOLIC PANEL - Abnormal; Notable for the following components:      Result Value   Glucose, Bld 104 (*)    Creatinine, Ser 1.29 (*)    GFR calc non Af Amer 54 (*)    All other components within normal limits  CBC  I-STAT TROPONIN, ED  POCT I-STAT TROPONIN I  I-STAT TROPONIN, ED  POCT I-STAT TROPONIN I    EKG EKG Interpretation  Date/Time:  _13  year old is here with what sounds like atypical chest pain.  Chest pain described as dull, intermittent while at rest, nonexertional, nonpleuritic.  Not associated with concerning features such as fever, cough, SOB, palpitations, light-headedness or symptoms of blood clot.  Cardiac risk factors include HTN, hypercholesterolemia, family hx, known CAD, remote tobacco > 37 yo.  On exam VS are wnl. CV and pulmonary exam benign. No signs to suggest DVT.  CXR unremarkable, EKG w/o ischemia, troponin x 2 within normal limits.  No risk factors for PE/DVT, tachycardia, tachypnea, hypoxic, pleuritic CP and I doubt PE in this patient. Heart score is less than/equal 3.  Given symptomatology, exam, work up I doubt symptomatic anemia, PE, PTX, dissection.  Given symptoms, reassuring ED work up, low risk HEART score patient will be discharged with recommendation to follow up with PCP for atypical chest pain,  possibly MSK etiology vs pleurisy vs costochondritis. ED return preacutions given. Pt appears reliable for follow up and is agreeable with plan.    Final Clinical Impressions(s) / ED Diagnoses   Final diagnoses:  Atypical chest pain    Paresthesias    ED Discharge Orders    None       Arlean Hopping 12/23/18 1724    Mesner, Corene Cornea, MD 12/24/18 [phone]

## 2018-12-23 NOTE — ED Provider Notes (Signed)
Medical screening examination/treatment/procedure(s) were conducted as a shared visit with non-physician practitioner(s) and myself.  I personally evaluated the patient during the encounter.  Remittent right anterior shoulder pain.  Seems to be worse with position symptoms when he wakes up.  Recently increased amount of weights he was using at the gym.  No associated shortness of breath, diaphoresis, dizziness.  No recent illnesses.  No trauma. Note states that he had numbness tingling around his eye wrist and hands however when I talk to him he says he has some tingling in fingers every once a while but nothing consistent does not have them right now. His neuro exam is normal.  His cardiac exam is normal.  His lungs sound clear.  His EKG looks fine.  Low suspicion for cardiac cause with a negative troponin and a very poor story.  Think is more accurate rotator cuff or shoulder arthritis.  EKG Interpretation  Date/Time:  Sunday December 23 2018 12:53:45 EST Ventricular Rate:  73 PR Interval:    QRS Duration: 84 QT Interval:  367 QTC Calculation: 405 R Axis:   -12 Text Interpretation:  Sinus rhythm Abnormal R-wave progression, early transition slower rate since previous No acute changes Confirmed by Marily Memos [phone]) on 12/23/2018 3:13:32 PM     Makendra Vigeant, Barbara Cower, MD 12/24/18 2035

## 2018-12-23 NOTE — ED Notes (Signed)
Claudia PA in with pt

## 2018-12-23 NOTE — ED Triage Notes (Signed)
Pt c/o numbness and tingling around R eye, R wrist and R hand. Pt also c/o intermittent R chest discomfort. Pt with equal strength and denies paralysis, face / smile symmetrical.

## 2018-12-23 NOTE — Discharge Instructions (Addendum)
You were evaluated in the emergency department for chest pain.    Based on your risk factors, work up and exam you are considered low risk for major adverse cardiac events in the next 30 days.  This means you can be discharged with close follow up with primary care provider for further outpatient work up.   The cause of your tingling in hands and lips is unclear, your electrolytes were normal.   Please return to ED if: Your chest pain is worse or on exertion You have a cough that gets worse, or you cough up blood. You have severe pain in chest, back or abdomen. You have chest pain or shortness of breath with exertion or activity You have sudden, unexplained discomfort in your chest, with radiation arms, back, neck, or jaw. You suddenly have chest pain and begin to sweat, or your skin gets clammy. You feel chest pain with nausea or vomiting. You suddenly feel light-headed or faint. Your heart begins to beat quickly, or it feels like it is skipping beats. You have one sided leg swelling or calf pain

## 2019-03-19 DIAGNOSIS — G5600 Carpal tunnel syndrome, unspecified upper limb: Secondary | ICD-10-CM | POA: Insufficient documentation

## 2019-07-30 DIAGNOSIS — H903 Sensorineural hearing loss, bilateral: Secondary | ICD-10-CM | POA: Insufficient documentation

## 2019-10-22 ENCOUNTER — Emergency Department (HOSPITAL_COMMUNITY)
Admission: EM | Admit: 2019-10-22 | Discharge: 2019-10-22 | Disposition: A | Discharge status: Home / Self Care | Payer: Medicare Other | Attending: Emergency Medicine | Admitting: Emergency Medicine

## 2019-10-22 ENCOUNTER — Other Ambulatory Visit: Payer: Self-pay

## 2019-10-22 ENCOUNTER — Encounter (HOSPITAL_COMMUNITY): Payer: Self-pay

## 2019-10-22 DIAGNOSIS — R339 Retention of urine, unspecified: Secondary | ICD-10-CM | POA: Insufficient documentation

## 2019-10-22 DIAGNOSIS — Z7982 Long term (current) use of aspirin: Secondary | ICD-10-CM | POA: Insufficient documentation

## 2019-10-22 DIAGNOSIS — R1030 Lower abdominal pain, unspecified: Secondary | ICD-10-CM | POA: Diagnosis not present

## 2019-10-22 DIAGNOSIS — I10 Essential (primary) hypertension: Secondary | ICD-10-CM | POA: Diagnosis not present

## 2019-10-22 DIAGNOSIS — Z79899 Other long term (current) drug therapy: Secondary | ICD-10-CM | POA: Diagnosis not present

## 2019-10-22 DIAGNOSIS — Z87891 Personal history of nicotine dependence: Secondary | ICD-10-CM | POA: Insufficient documentation

## 2019-10-22 DIAGNOSIS — J45909 Unspecified asthma, uncomplicated: Secondary | ICD-10-CM | POA: Diagnosis not present

## 2019-10-22 LAB — BASIC METABOLIC PANEL
Anion gap: 6 (ref 5–15)
BUN: 17 mg/dL (ref 8–23)
CO2: 24 mmol/L (ref 22–32)
Calcium: 9.4 mg/dL (ref 8.9–10.3)
Chloride: 108 mmol/L (ref 98–111)
Creatinine, Ser: 1.33 mg/dL — ABNORMAL HIGH (ref 0.61–1.24)
GFR calc Af Amer: >60 mL/min (ref >60)
GFR calc non Af Amer: 52 mL/min — ABNORMAL LOW (ref >60)
Glucose, Bld: 104 mg/dL — ABNORMAL HIGH (ref 70–99)
Potassium: 3.8 mmol/L (ref 3.5–5.1)
Sodium: 138 mmol/L (ref 135–145)

## 2019-10-22 LAB — URINALYSIS, ROUTINE W REFLEX MICROSCOPIC
Bilirubin Urine: NEGATIVE
Glucose, UA: NEGATIVE mg/dL
Ketones, ur: NEGATIVE mg/dL
Leukocytes,Ua: NEGATIVE
Nitrite: NEGATIVE
Protein, ur: NEGATIVE mg/dL
RBC / HPF: >50 RBC/hpf — ABNORMAL HIGH (ref 0–5)
Specific Gravity, Urine: 1.006 (ref 1.005–1.030)
pH: 6 (ref 5.0–8.0)

## 2019-10-22 LAB — CBC WITH DIFFERENTIAL/PLATELET
Abs Immature Granulocytes: 0.01 10*3/uL (ref 0.00–0.07)
Basophils Absolute: 0 10*3/uL (ref 0.0–0.1)
Basophils Relative: 1 %
Eosinophils Absolute: 0.1 10*3/uL (ref 0.0–0.5)
Eosinophils Relative: 3 %
HCT: 43.5 % (ref 39.0–52.0)
Hemoglobin: 14.4 g/dL (ref 13.0–17.0)
Immature Granulocytes: 0 %
Lymphocytes Relative: 13 %
Lymphs Abs: 0.5 10*3/uL — ABNORMAL LOW (ref 0.7–4.0)
MCH: 29.5 pg (ref 26.0–34.0)
MCHC: 33.1 g/dL (ref 30.0–36.0)
MCV: 89.1 fL (ref 80.0–100.0)
Monocytes Absolute: 0.3 10*3/uL (ref 0.1–1.0)
Monocytes Relative: 7 %
Neutro Abs: 2.8 10*3/uL (ref 1.7–7.7)
Neutrophils Relative %: 76 %
Platelets: 182 10*3/uL (ref 150–400)
RBC: 4.88 MIL/uL (ref 4.22–5.81)
RDW: 12.1 % (ref 11.5–15.5)
WBC: 3.7 10*3/uL — ABNORMAL LOW (ref 4.0–10.5)
nRBC: 0 % (ref 0.0–0.2)

## 2019-10-22 NOTE — ED Triage Notes (Signed)
Patient reports sudden onset of pain 3 hours ago and can not urinate.   Patient states since urolift 1 year ago he has had issues with urination.   Hx. Prostate issues.   9/10  Patient reports taking a muscle relaxer with no relief.   A/ox4 Ambulatory in triage

## 2019-10-22 NOTE — Discharge Instructions (Signed)
Please call your urologist, Dr. Alyson Ingles to schedule appointment for recheck later this week.  Likely at that visit, they will remove your Foley catheter.  Please continue the Foley catheter until that visit.  If you develop a fever, worsening pain, other new concerning symptom, please return to ER for reassessment.

## 2019-10-22 NOTE — ED Provider Notes (Signed)
[facility] DEPT Provider Note   CSN: [id number] Arrival date & time: 10/22/19  0903     History   Chief Complaint Chief Complaint  Patient presents with   Urinary Retention    HPI Wesley Weber is a 73 y.o. male.  Presents to the emergency department with chief complaint of urinary retention, lower abdominal pain.  Patient states last had normal urination last night.  This morning he noted having increased difficulty with urination, only able to get a few drops out.  Also feeling more bloated and having some pain in his lower abdomen.  No fevers.  1 year ago had UroLift procedure due to urinary incontinence.  Has not had any complications or issues since that time.     HPI  Past Medical History:  Diagnosis Date   Asthma    Benign localized prostatic hyperplasia with lower urinary tract symptoms (LUTS)    Chronic constipation    Chronic dryness of both eyes    GERD (gastroesophageal reflux disease)    History of prostatitis    Hyperlipidemia    Hypertension    IDA (iron deficiency anemia)    OSA on CPAP    Wears glasses    Wears hearing aid in both ears     Patient Active Problem List   Diagnosis Date Noted   Urinary retention 02/04/2018    Past Surgical History:  Procedure Laterality Date   ANKLE ARTHRODESIS Right 05/27/2008   CYSTOSCOPY WITH INSERTION OF UROLIFT  05/14/2018  _0  [facility] (MAC anesthesia)   CYSTOSCOPY WITH INSERTION OF UROLIFT N/A 09/10/2018   Procedure: CYSTOSCOPY WITH INSERTION OF UROLIFT;  Surgeon: Cleon Gustin, MD;  Location: [facility];  Service: Urology;  Laterality: N/A;   KNEE ARTHROSCOPY W/ MENISCAL REPAIR Left 01-22-2015  _1         Home Medications    Prior to Admission medications   Medication Sig Start Date End Date Taking? Authorizing Provider  albuterol (PROVENTIL HFA;VENTOLIN HFA) 108 (90 Base) MCG/ACT inhaler Inhale 2 puffs into the lungs  every 4 (four) hours as needed for wheezing or shortness of breath.    [provider]  aspirin EC 81 MG tablet Take 81 mg by mouth daily.    [provider]  atorvastatin (LIPITOR) 40 MG tablet Take 20 mg by mouth daily at 6 PM.    [provider]  chlorpheniramine (CHLOR-TRIMETON) 4 MG tablet Take 4 mg by mouth daily as needed for allergies.    [provider]  Cholecalciferol (VITAMIN D) 2000 units CAPS Take 2,000 Units by mouth daily.     [provider]  ferrous sulfate 325 (65 FE) MG tablet Take 325 mg by mouth daily with breakfast.    [provider]  mometasone (ASMANEX) 220 MCG/INH inhaler Inhale 2 puffs into the lungs at bedtime.     [provider]  pantoprazole (PROTONIX) 40 MG tablet Take 40 mg by mouth every morning.  12/20/17   [provider]  Polyethyl Glycol-Propyl Glycol (SYSTANE OP) Apply 1 drop to eye as needed (dry eyes).     [provider]  polyethylene glycol (MIRALAX / GLYCOLAX) packet Take 17 g by mouth daily.    [provider]  tadalafil (ADCIRCA/CIALIS) 20 MG tablet Take 20 mg by mouth daily as needed for erectile dysfunction.    [provider]  valsartan (DIOVAN) 160 MG tablet Take 160 mg by mouth daily.    [provider]    Family History Family History  Problem Relation Age of Onset   Diabetes Mother    Cancer Father     Social History Social History   Tobacco Use   Smoking status: Former Smoker    Years: 10.00    Types: Cigarettes    Quit date: 09/06/1986    Years since quitting: 33.1   Smokeless tobacco: Never Used  Substance Use Topics   Alcohol use: Not Currently   Drug use: Never     Allergies   Ciprofloxacin, Lisinopril, Minoxidil, Hydralazine, and Omeprazole   Review of Systems Review of Systems  Constitutional: Negative for chills and fever.  HENT: Negative for ear pain and sore throat.   Eyes: Negative for pain and  visual disturbance.  Respiratory: Negative for cough and shortness of breath.   Cardiovascular: Negative for chest pain and palpitations.  Gastrointestinal: Negative for abdominal pain and vomiting.  Genitourinary: Positive for decreased urine volume. Negative for dysuria and hematuria.  Musculoskeletal: Negative for arthralgias and back pain.  Skin: Negative for color change and rash.  Neurological: Negative for seizures and syncope.  All other systems reviewed and are negative.    Physical Exam Updated Vital Signs BP 119/75 (BP Location: Right Arm)    Pulse 65    Temp 98.2 F (36.8 C) (Oral)    Resp 18    SpO2 98%   Physical Exam Vitals signs and nursing note reviewed.  Constitutional:      Appearance: He is well-developed.  HENT:     Head: Normocephalic and atraumatic.  Eyes:     Conjunctiva/sclera: Conjunctivae normal.  Neck:     Musculoskeletal: Neck supple.  Cardiovascular:     Rate and Rhythm: Normal rate and regular rhythm.     Heart sounds: No murmur.  Pulmonary:     Effort: Pulmonary effort is normal. No respiratory distress.     Breath sounds: Normal breath sounds.  Abdominal:     Palpations: Abdomen is soft.     Tenderness: There is no guarding or rebound.     Comments: Mild tenderness in suprapubic region  Musculoskeletal:        General: No swelling or tenderness.  Skin:    General: Skin is warm and dry.     Capillary Refill: Capillary refill takes less than 2 seconds.  Neurological:     General: No focal deficit present.     Mental Status: He is alert and oriented to person, place, and time.  Psychiatric:        Mood and Affect: Mood normal.        Behavior: Behavior normal.      ED Treatments / Results  Labs (all labs ordered are listed, but only abnormal results are displayed) Labs Reviewed  URINALYSIS, ROUTINE W REFLEX MICROSCOPIC - Abnormal; Notable for the following components:      Result Value   Color, Urine STRAW (*)    Hgb urine  dipstick LARGE (*)    RBC / HPF >50 (*)    Bacteria, UA RARE (*)    All other components within normal limits  CBC WITH DIFFERENTIAL/PLATELET - Abnormal; Notable for the following components:   WBC 3.7 (*)    Lymphs Abs 0.5 (*)    All other components within normal limits  BASIC METABOLIC PANEL - Abnormal; Notable for the following components:   Glucose, Bld 104 (*)    Creatinine, Ser 1.33 (*)    GFR calc non Af Wyvonnia Lora  52 (*)    All other components within normal limits    EKG None  Radiology No results found.  Procedures Procedures (including critical care time)  Medications Ordered in ED Medications - No data to display   Initial Impression / Assessment and Plan / ED Course  I have reviewed the triage vital signs and the nursing notes.  Pertinent labs & imaging results that were available during my care of the patient were reviewed by me and considered in my medical decision making (see chart for details).        72 year old male presented to the ER with new onset urinary retention, noted to be well-appearing on exam, normal vital signs.  Bladder scan with large urine, Foley catheter inserted, ~556m urine. No infection on UA.  Will leave Foley in place and recommend close follow-up with his urologist.  Patient's electrolytes were within normal limits, no change in kidney function. Will dc home.    After the discussed management above, the patient was determined to be safe for discharge.  The patient was in agreement with this plan and all questions regarding their care were answered.  ED return precautions were discussed and the patient will return to the ED with any significant worsening of condition.    Final Clinical Impressions(s) / ED Diagnoses   Final diagnoses:  Urinary retention    ED Discharge Orders    None       DLucrezia Starch MD 10/22/19 1539

## 2019-10-22 NOTE — ED Notes (Signed)
Discharge instructions reviewed with patient. Patient verbalizes understanding. VSS.   

## 2019-10-22 NOTE — ED Notes (Signed)
Patient given leg bag and educated patient on use. Patient demonstrated changing from standard bag to leg bag. Patient verbalized understanding of good peri care while using the foley catheter. Patient states he will be following up with his urologist this week for catheter evaluation and possible removal. Patient verbalizes understanding for when to return to ED.

## 2019-10-25 ENCOUNTER — Other Ambulatory Visit: Payer: Self-pay

## 2019-10-25 ENCOUNTER — Encounter (HOSPITAL_COMMUNITY): Payer: Self-pay

## 2019-10-25 ENCOUNTER — Emergency Department (HOSPITAL_COMMUNITY)
Admission: EM | Admit: 2019-10-25 | Discharge: 2019-10-25 | Disposition: A | Discharge status: Home / Self Care | Payer: Medicare Other | Attending: Emergency Medicine | Admitting: Emergency Medicine

## 2019-10-25 DIAGNOSIS — J45909 Unspecified asthma, uncomplicated: Secondary | ICD-10-CM | POA: Diagnosis not present

## 2019-10-25 DIAGNOSIS — Y69 Unspecified misadventure during surgical and medical care: Secondary | ICD-10-CM | POA: Insufficient documentation

## 2019-10-25 DIAGNOSIS — Z79899 Other long term (current) drug therapy: Secondary | ICD-10-CM | POA: Diagnosis not present

## 2019-10-25 DIAGNOSIS — Z87891 Personal history of nicotine dependence: Secondary | ICD-10-CM | POA: Insufficient documentation

## 2019-10-25 DIAGNOSIS — I10 Essential (primary) hypertension: Secondary | ICD-10-CM | POA: Insufficient documentation

## 2019-10-25 DIAGNOSIS — T839XXA Unspecified complication of genitourinary prosthetic device, implant and graft, initial encounter: Secondary | ICD-10-CM | POA: Insufficient documentation

## 2019-10-25 DIAGNOSIS — Z7982 Long term (current) use of aspirin: Secondary | ICD-10-CM | POA: Insufficient documentation

## 2019-10-25 NOTE — ED Notes (Signed)
Catheter removed. Blood noted on catheter. No bleeding from urethra once catheter removed.

## 2019-10-25 NOTE — ED Provider Notes (Signed)
[facility] DEPT Provider Note   CSN: [id number] Arrival date & time: 10/25/19  1108     History   Chief Complaint Chief Complaint  Patient presents with  . foley cath issues    HPI Wesley Weber is a 73 y.o. male.     HPI Patient presents with concern of leakage around his Foley catheter. Patient has a history of recurrent urinary retention.  4 days ago he was seen and evaluated here during an episode of this. Patient had placement of a Foley catheter. The notes that on discharge she was doing well, had appropriate drainage of urine into his catheter bag.  However, over the last 2 days or so he has developed new drainage around the catheter itself, with substantial leakage. No new abdominal pain, no fever, chills, any other new complaints.  Past Medical History:  Diagnosis Date  . Asthma   . Benign localized prostatic hyperplasia with lower urinary tract symptoms (LUTS)   . Chronic constipation   . Chronic dryness of both eyes   . GERD (gastroesophageal reflux disease)   . History of prostatitis   . Hyperlipidemia   . Hypertension   . IDA (iron deficiency anemia)   . OSA on CPAP   . Wears glasses   . Wears hearing aid in both ears     Patient Active Problem List   Diagnosis Date Noted  . Urinary retention 02/04/2018    Past Surgical History:  Procedure Laterality Date  . ANKLE ARTHRODESIS Right 05/27/2008  . CYSTOSCOPY WITH INSERTION OF UROLIFT  05/14/2018  _0  [facility] (MAC anesthesia)  . CYSTOSCOPY WITH INSERTION OF UROLIFT N/A 09/10/2018   Procedure: CYSTOSCOPY WITH INSERTION OF UROLIFT;  Surgeon: Cleon Gustin, MD;  Location: [facility];  Service: Urology;  Laterality: N/A;  . KNEE ARTHROSCOPY W/ MENISCAL REPAIR Left 01-22-2015  _1         Home Medications    Prior to Admission medications   Medication Sig Start Date End Date Taking? Authorizing Provider  albuterol (PROVENTIL  HFA;VENTOLIN HFA) 108 (90 Base) MCG/ACT inhaler Inhale 2 puffs into the lungs every 4 (four) hours as needed for wheezing or shortness of breath.    [provider]  aspirin EC 81 MG tablet Take 81 mg by mouth daily.    [provider]  atorvastatin (LIPITOR) 40 MG tablet Take 20 mg by mouth daily at 6 PM.    [provider]  chlorpheniramine (CHLOR-TRIMETON) 4 MG tablet Take 4 mg by mouth daily as needed for allergies.    [provider]  Cholecalciferol (VITAMIN D) 2000 units CAPS Take 2,000 Units by mouth daily.     [provider]  ferrous sulfate 325 (65 FE) MG tablet Take 325 mg by mouth daily with breakfast.    [provider]  mometasone (ASMANEX) 220 MCG/INH inhaler Inhale 2 puffs into the lungs at bedtime.     [provider]  pantoprazole (PROTONIX) 40 MG tablet Take 40 mg by mouth every morning.  12/20/17   [provider]  Polyethyl Glycol-Propyl Glycol (SYSTANE OP) Apply 1 drop to eye as needed (dry eyes).     [provider]  polyethylene glycol (MIRALAX / GLYCOLAX) packet Take 17 g by mouth daily.    [provider]  tadalafil (ADCIRCA/CIALIS) 20 MG tablet Take 20 mg by mouth daily as needed for erectile dysfunction.    [provider]  valsartan (DIOVAN) 160 MG tablet  Take 160 mg by mouth daily.    [provider]    Family History Family History  Problem Relation Age of Onset  . Diabetes Mother   . Cancer Father     Social History Social History   Tobacco Use  . Smoking status: Former Smoker    Years: 10.00    Types: Cigarettes    Quit date: 09/06/1986    Years since quitting: 33.1  . Smokeless tobacco: Never Used  Substance Use Topics  . Alcohol use: Not Currently  . Drug use: Never     Allergies   Ciprofloxacin, Lisinopril, Minoxidil, Hydralazine, and Omeprazole   Review of Systems Review of Systems  Constitutional:       Per HPI, otherwise  negative  HENT:       Per HPI, otherwise negative  Respiratory:       Per HPI, otherwise negative  Cardiovascular:       Per HPI, otherwise negative  Gastrointestinal: Negative for vomiting.  Endocrine:       Negative aside from HPI  Genitourinary:       Neg aside from HPI   Musculoskeletal:       Per HPI, otherwise negative  Skin: Negative.   Neurological: Negative for syncope.     Physical Exam Updated Vital Signs BP (!) 196/98 (BP Location: Right Arm)   Pulse 88   Temp 98.5 F (36.9 C) (Oral)   Resp 16   Ht _0  (1.854 m)   Wt 108 kg   SpO2 100%   BMI 31.40 kg/m   Physical Exam Vitals signs and nursing note reviewed.  Constitutional:      General: He is not in acute distress.    Appearance: He is well-developed.  HENT:     Head: Normocephalic and atraumatic.  Eyes:     Conjunctiva/sclera: Conjunctivae normal.  Pulmonary:     Effort: Pulmonary effort is normal. No respiratory distress.     Breath sounds: No stridor.  Abdominal:     General: There is no distension.  Genitourinary:   Skin:    General: Skin is warm and dry.  Neurological:     Mental Status: He is alert and oriented to person, place, and time.      ED Treatments / Results  Labs (all labs ordered are listed, but only abnormal results are displayed) Labs Reviewed - No data to display  EKG None  Radiology No results found.  Procedures Procedures (including critical care time)  Medications Ordered in ED Medications - No data to display   Initial Impression / Assessment and Plan / ED Course  I have reviewed the triage vital signs and the nursing notes.  Pertinent labs & imaging results that were available during my care of the patient were reviewed by me and considered in my medical decision making (see chart for details).    I reviewed the patient's chart from 4 days ago with placement of Foley catheter secondary to acute urinary retention.   After initial evaluation we  discussed possibilities for therapy including removal and replacement of the catheter with a larger diameter device, adjustment, or removal with attempted spontaneous voiding.  Patient opted for this last option. 1:01 PM Patient has been able to urinate, after catheter removed, bruising approximately 300 mL. He has no ongoing complaints.  This adult male presents after episode of acute urinary retention, treated with Foley catheter 4 days ago. He presents today due to concern for complications of the  catheter. After removal, the patient is able to void spontaneously. Patient has a urologist with whom you follow-up, and is in need scheduled for visit in 4 days. Patient discharged in stable condition after early conversation about need for return should he develop a recurrence.  Patient discharged with urology follow-up in stable condition.   Final Clinical Impressions(s) / ED Diagnoses   Final diagnoses:  Foley catheter problem, initial encounter [facility])     Carmin Muskrat, MD 10/25/19 1304

## 2019-10-25 NOTE — Discharge Instructions (Addendum)
As discussed, if you develop a recurrence of your urinary retention is very important to return here immediately. Otherwise, please follow-up with your urologist.

## 2019-10-25 NOTE — ED Triage Notes (Signed)
Patient states the urine is not going into the foley bag,  but is spraying out. patient states the foley was placed 3 days in the ED due to urinary retention.

## 2019-10-26 ENCOUNTER — Encounter (HOSPITAL_COMMUNITY): Payer: Self-pay | Admitting: Emergency Medicine

## 2019-10-26 ENCOUNTER — Other Ambulatory Visit: Payer: Self-pay

## 2019-10-26 DIAGNOSIS — R319 Hematuria, unspecified: Secondary | ICD-10-CM | POA: Diagnosis present

## 2019-10-26 DIAGNOSIS — Z79899 Other long term (current) drug therapy: Secondary | ICD-10-CM | POA: Insufficient documentation

## 2019-10-26 NOTE — ED Triage Notes (Signed)
Patient here from home with complaints of blood in urine today. Reports that he had a catheter removed yesterday. States that he will pass blood for a few hours and then it will go away, and then come back.

## 2019-10-27 ENCOUNTER — Emergency Department (HOSPITAL_COMMUNITY)
Admission: EM | Admit: 2019-10-27 | Discharge: 2019-10-27 | Disposition: A | Discharge status: Home / Self Care | Payer: Medicare Other | Attending: Emergency Medicine | Admitting: Emergency Medicine

## 2019-10-27 ENCOUNTER — Encounter: Payer: Self-pay | Admitting: Emergency Medicine

## 2019-10-27 ENCOUNTER — Emergency Department
Admission: EM | Admit: 2019-10-27 | Discharge: 2019-10-27 | Disposition: A | Discharge status: Home / Self Care | Payer: Medicare Other | Attending: Emergency Medicine | Admitting: Emergency Medicine

## 2019-10-27 ENCOUNTER — Other Ambulatory Visit: Payer: Self-pay

## 2019-10-27 DIAGNOSIS — J45909 Unspecified asthma, uncomplicated: Secondary | ICD-10-CM | POA: Insufficient documentation

## 2019-10-27 DIAGNOSIS — Z7982 Long term (current) use of aspirin: Secondary | ICD-10-CM | POA: Insufficient documentation

## 2019-10-27 DIAGNOSIS — R319 Hematuria, unspecified: Secondary | ICD-10-CM | POA: Diagnosis present

## 2019-10-27 DIAGNOSIS — I10 Essential (primary) hypertension: Secondary | ICD-10-CM | POA: Diagnosis not present

## 2019-10-27 DIAGNOSIS — Z79899 Other long term (current) drug therapy: Secondary | ICD-10-CM | POA: Insufficient documentation

## 2019-10-27 DIAGNOSIS — R310 Gross hematuria: Secondary | ICD-10-CM | POA: Insufficient documentation

## 2019-10-27 DIAGNOSIS — Z87891 Personal history of nicotine dependence: Secondary | ICD-10-CM | POA: Insufficient documentation

## 2019-10-27 LAB — URINALYSIS, COMPLETE (UACMP) WITH MICROSCOPIC
Bacteria, UA: NONE SEEN
Bilirubin Urine: NEGATIVE
Glucose, UA: NEGATIVE mg/dL
Ketones, ur: NEGATIVE mg/dL
Leukocytes,Ua: NEGATIVE
Nitrite: NEGATIVE
Protein, ur: NEGATIVE mg/dL
RBC / HPF: >50 RBC/hpf — ABNORMAL HIGH (ref 0–5)
Specific Gravity, Urine: 1.019 (ref 1.005–1.030)
Squamous Epithelial / HPF: NONE SEEN (ref 0–5)
pH: 5 (ref 5.0–8.0)

## 2019-10-27 LAB — BASIC METABOLIC PANEL
Anion gap: 7 (ref 5–15)
BUN: 22 mg/dL (ref 8–23)
CO2: 27 mmol/L (ref 22–32)
Calcium: 9.9 mg/dL (ref 8.9–10.3)
Chloride: 106 mmol/L (ref 98–111)
Creatinine, Ser: 1.26 mg/dL — ABNORMAL HIGH (ref 0.61–1.24)
GFR calc Af Amer: >60 mL/min (ref >60)
GFR calc non Af Amer: 55 mL/min — ABNORMAL LOW (ref >60)
Glucose, Bld: 111 mg/dL — ABNORMAL HIGH (ref 70–99)
Potassium: 4.2 mmol/L (ref 3.5–5.1)
Sodium: 140 mmol/L (ref 135–145)

## 2019-10-27 LAB — CBC
HCT: 41.4 % (ref 39.0–52.0)
Hemoglobin: 14.3 g/dL (ref 13.0–17.0)
MCH: 29.7 pg (ref 26.0–34.0)
MCHC: 34.5 g/dL (ref 30.0–36.0)
MCV: 86.1 fL (ref 80.0–100.0)
Platelets: 216 10*3/uL (ref 150–400)
RBC: 4.81 MIL/uL (ref 4.22–5.81)
RDW: 12.3 % (ref 11.5–15.5)
WBC: 4.6 10*3/uL (ref 4.0–10.5)
nRBC: 0 % (ref 0.0–0.2)

## 2019-10-27 NOTE — ED Triage Notes (Signed)
Patient ambulatory to triage with complaints of blood in urine since foley removed Friday.  Pt had foley placed for urinary retention. Pt has pictures from 1930 last night.    Speaking in complete coherent sentences. No acute breathing distress noted.

## 2019-10-27 NOTE — ED Triage Notes (Signed)
Pt takes daily 81 mg ASA

## 2019-10-27 NOTE — ED Provider Notes (Addendum)
[facility] Emergency Department Provider Note  ____________________________________________  Time seen: Approximately 4:02 AM  I have reviewed the triage vital signs and the nursing notes.   HISTORY  Chief Complaint Hematuria   HPI Wesley Weber is a 73 y.o. male with a history of BPH and recurrent episodes of urinary retention who presents for evaluation of hematuria.  Patient had a Foley catheter placed 5 days ago for urinary retention.  Presented to the hospital 24 hours ago with hematuria.  The Foley catheter was removed.  Patient has been able to urinate with no difficulty and feels like he is able to fully empty his bladder.  He has been having hematuria and passing some clots ever since.  He is not on blood thinners other than an aspirin.  He denies abdominal pain, flank pain, chest pain, shortness of breath, dizziness or syncope.  He has an appointment in 3 days with urology.  He returns to the ER due to concerns of ongoing bleeding. No dysuria.  Past Medical History:  Diagnosis Date   Asthma    Benign localized prostatic hyperplasia with lower urinary tract symptoms (LUTS)    Chronic constipation    Chronic dryness of both eyes    GERD (gastroesophageal reflux disease)    History of prostatitis    Hyperlipidemia    Hypertension    IDA (iron deficiency anemia)    OSA on CPAP    Wears glasses    Wears hearing aid in both ears     Patient Active Problem List   Diagnosis Date Noted   Urinary retention 02/04/2018    Past Surgical History:  Procedure Laterality Date   ANKLE ARTHRODESIS Right 05/27/2008   CYSTOSCOPY WITH INSERTION OF UROLIFT  05/14/2018  _0  [facility] (MAC anesthesia)   CYSTOSCOPY WITH INSERTION OF UROLIFT N/A 09/10/2018   Procedure: CYSTOSCOPY WITH INSERTION OF UROLIFT;  Surgeon: Cleon Gustin, MD;  Location: [facility];  Service: Urology;  Laterality: N/A;   KNEE ARTHROSCOPY W/  MENISCAL REPAIR Left 01-22-2015  _1     Prior to Admission medications   Medication Sig Start Date End Date Taking? Authorizing Provider  albuterol (PROVENTIL HFA;VENTOLIN HFA) 108 (90 Base) MCG/ACT inhaler Inhale 2 puffs into the lungs every 4 (four) hours as needed for wheezing or shortness of breath.    [provider]  aspirin EC 81 MG tablet Take 81 mg by mouth daily.    [provider]  atorvastatin (LIPITOR) 40 MG tablet Take 20 mg by mouth daily at 6 PM.    [provider]  chlorpheniramine (CHLOR-TRIMETON) 4 MG tablet Take 4 mg by mouth daily as needed for allergies.    [provider]  Cholecalciferol (VITAMIN D) 2000 units CAPS Take 2,000 Units by mouth daily.     [provider]  ferrous sulfate 325 (65 FE) MG tablet Take 325 mg by mouth daily with breakfast.    [provider]  mometasone (ASMANEX) 220 MCG/INH inhaler Inhale 2 puffs into the lungs at bedtime.     [provider]  pantoprazole (PROTONIX) 40 MG tablet Take 40 mg by mouth every morning.  12/20/17   [provider]  Polyethyl Glycol-Propyl Glycol (SYSTANE OP) Apply 1 drop to eye as needed (dry eyes).     [provider]  polyethylene glycol (MIRALAX / GLYCOLAX) packet Take 17 g by mouth daily.    [provider]  tadalafil (ADCIRCA/CIALIS) 20 MG tablet Take  20 mg by mouth daily as needed for erectile dysfunction.    [provider]  valsartan (DIOVAN) 160 MG tablet Take 160 mg by mouth daily.    [provider]    Allergies Ciprofloxacin, Lisinopril, Loratadine, Minoxidil, Terazosin, Hydralazine, and Omeprazole  Family History  Problem Relation Age of Onset   Diabetes Mother    Cancer Father     Social History Social History   Tobacco Use   Smoking status: Former Smoker    Years: 10.00    Types: Cigarettes    Quit date: 09/06/1986    Years since quitting: 33.1   Smokeless tobacco: Never Used   Substance Use Topics   Alcohol use: Not Currently   Drug use: Never    Review of Systems  Constitutional: Negative for fever. Eyes: Negative for visual changes. ENT: Negative for sore throat. Neck: No neck pain  Cardiovascular: Negative for chest pain. Respiratory: Negative for shortness of breath. Gastrointestinal: Negative for abdominal pain, vomiting or diarrhea. Genitourinary: Negative for dysuria. + hematuria Musculoskeletal: Negative for back pain. Skin: Negative for rash. Neurological: Negative for headaches, weakness or numbness. Psych: No SI or HI  ____________________________________________   PHYSICAL EXAM:  VITAL SIGNS: ED Triage Vitals  Enc Vitals Group     BP 10/27/19 0142 (!) 150/69     Pulse Rate 10/27/19 0142 69     Resp 10/27/19 0142 17     Temp 10/27/19 0142 98.8 F (37.1 C)     Temp Source 10/27/19 0142 Oral     SpO2 10/27/19 0142 96 %     Weight 10/27/19 0143 238 lb (108 kg)     Height 10/27/19 0143 _0  (1.854 m)     Head Circumference --      Peak Flow --      Pain Score 10/27/19 0214 0     Pain Loc --      Pain Edu? --      Excl. in [place]? --     Constitutional: Alert and oriented. Well appearing and in no apparent distress. HEENT:      Head: Normocephalic and atraumatic.         Eyes: Conjunctivae are normal. Sclera is non-icteric.       Mouth/Throat: Mucous membranes are moist.       Neck: Supple with no signs of meningismus. Cardiovascular: Regular rate and rhythm. No murmurs, gallops, or rubs. 2+ symmetrical distal pulses are present in all extremities. No JVD. Respiratory: Normal respiratory effort. Lungs are clear to auscultation bilaterally. No wheezes, crackles, or rhonchi.  Gastrointestinal: Soft, non tender, and non distended with positive bowel sounds. No rebound or guarding. Genitourinary: No CVA tenderness. Musculoskeletal: Nontender with normal range of motion in all extremities. No edema, cyanosis, or erythema of  extremities. Neurologic: Normal speech and language. Face is symmetric. Moving all extremities. No gross focal neurologic deficits are appreciated. Skin: Skin is warm, dry and intact. No rash noted. Psychiatric: Mood and affect are normal. Speech and behavior are normal.  ____________________________________________   LABS (all labs ordered are listed, but only abnormal results are displayed)  Labs Reviewed  URINALYSIS, COMPLETE (UACMP) WITH MICROSCOPIC - Abnormal; Notable for the following components:      Result Value   Color, Urine YELLOW (*)    APPearance CLEAR (*)    Hgb urine dipstick LARGE (*)    RBC / HPF >50 (*)    All other components within normal limits  BASIC METABOLIC PANEL - Abnormal;  Notable for the following components:   Glucose, Bld 111 (*)    Creatinine, Ser 1.26 (*)    GFR calc non Af Amer 55 (*)    All other components within normal limits  URINE CULTURE  CBC   ____________________________________________  EKG  none  ____________________________________________  RADIOLOGY  none  ____________________________________________   PROCEDURES  Procedure(s) performed: None Procedures Critical Care performed:  None ____________________________________________   INITIAL IMPRESSION / ASSESSMENT AND PLAN / ED COURSE  73 y.o. male with a history of BPH and recurrent episodes of urinary retention who presents for evaluation of hematuria x 24 hours since having a Foley catheter removed for retention. Patient able to empty bladder, postvoid of 36cc, HD stable, hgb stable at 14.3. UA negative for UTI, cx pending. No signs or symptoms of UTI. Most likely traumatic. Discuss return precautions for signs of retention, acute blood loss anemia or urinary tract infection.  Recommended calling the urologist Monday morning if the bleeding is ongoing to try to anticipate his appointment otherwise patient to follow-up on his current appointment 3 days from now.        As part of my medical decision making, I reviewed the following data within the [facility] notes reviewed and incorporated, Labs reviewed , Old chart reviewed, Notes from prior ED visits and [place] Controlled Substance Database   Please note:  Patient was evaluated in Emergency Department today for the symptoms described in the history of present illness. Patient was evaluated in the context of the global COVID-19 pandemic, which necessitated consideration that the patient might be at risk for infection with the SARS-CoV-2 virus that causes COVID-19. Institutional protocols and algorithms that pertain to the evaluation of patients at risk for COVID-19 are in a state of rapid change based on information released by regulatory bodies including the CDC and federal and state organizations. These policies and algorithms were followed during the patient's care in the ED.  Some ED evaluations and interventions may be delayed as a result of limited staffing during the pandemic.   ____________________________________________   FINAL CLINICAL IMPRESSION(S) / ED DIAGNOSES   Final diagnoses:  Gross hematuria      NEW MEDICATIONS STARTED DURING THIS VISIT:  ED Discharge Orders    None       Note:  This document was prepared using Dragon voice recognition software and may include unintentional dictation errors.    Alfred Levins, [place], MD 10/27/19 [place], [place], MD 10/27/19 [phone]

## 2019-10-27 NOTE — Discharge Instructions (Signed)
Return to the emergency room if you are not able to fully empty your bladder, if you have abdominal or flank pain, fever or chills, dizziness, chest pain or shortness of breath.  If you are still bleeding Monday morning, call your urologist to anticipate your appointment.  If the bleeding resolved by then it is okay to keep your appointment on Wednesday.

## 2019-10-27 NOTE — ED Notes (Signed)
No peripheral IV placed this visit.   Discharge instructions reviewed with patient. Questions fielded by this RN. Patient verbalizes understanding of instructions. Patient discharged home in stable condition per [place]. No acute distress noted at time of discharge.    

## 2019-10-28 ENCOUNTER — Emergency Department
Admission: EM | Admit: 2019-10-28 | Discharge: 2019-10-29 | Disposition: A | Discharge status: Home / Self Care | Payer: Medicare Other | Attending: Emergency Medicine | Admitting: Emergency Medicine

## 2019-10-28 ENCOUNTER — Other Ambulatory Visit: Payer: Self-pay

## 2019-10-28 DIAGNOSIS — T839XXD Unspecified complication of genitourinary prosthetic device, implant and graft, subsequent encounter: Secondary | ICD-10-CM | POA: Diagnosis not present

## 2019-10-28 DIAGNOSIS — R319 Hematuria, unspecified: Secondary | ICD-10-CM | POA: Diagnosis present

## 2019-10-28 DIAGNOSIS — Z7982 Long term (current) use of aspirin: Secondary | ICD-10-CM | POA: Insufficient documentation

## 2019-10-28 DIAGNOSIS — Y732 Prosthetic and other implants, materials and accessory gastroenterology and urology devices associated with adverse incidents: Secondary | ICD-10-CM | POA: Insufficient documentation

## 2019-10-28 DIAGNOSIS — J45909 Unspecified asthma, uncomplicated: Secondary | ICD-10-CM | POA: Insufficient documentation

## 2019-10-28 DIAGNOSIS — Z79899 Other long term (current) drug therapy: Secondary | ICD-10-CM | POA: Insufficient documentation

## 2019-10-28 DIAGNOSIS — Z87891 Personal history of nicotine dependence: Secondary | ICD-10-CM | POA: Diagnosis not present

## 2019-10-28 DIAGNOSIS — I10 Essential (primary) hypertension: Secondary | ICD-10-CM | POA: Diagnosis not present

## 2019-10-28 DIAGNOSIS — R339 Retention of urine, unspecified: Secondary | ICD-10-CM | POA: Diagnosis not present

## 2019-10-28 LAB — URINE CULTURE: Culture: NO GROWTH

## 2019-10-28 NOTE — ED Provider Notes (Signed)
[facility] Emergency Department Provider Note ____________________________________________  Time seen: 2229  I have reviewed the triage vital signs and the nursing notes.  HISTORY  Chief Complaint  Urinary Retention  HPI Wesley Weber is a 73 y.o. male returns to the ED for subsequent evaluation of continued gross hematuria and Foley catheter evaluation.  Patient was seen and discharged from the ED this morning at about 4 AM,  for ongoing gross hematuria.  He reports that his urologist office replaced his foley/leg bag this morning. He was started on Keflex and Alfuzosin. He returned to the ED this evening for recurrent hematuria and to request a large gravity bag, to wear overnight. He is filling the leg bag repeatedly overnight. He denies any fevers, nausea, weakness, or retention. He is requesting a large replacement bag. He is scheduled to see his urologist on Wednesday.   Past Medical History:  Diagnosis Date  . Asthma   . Benign localized prostatic hyperplasia with lower urinary tract symptoms (LUTS)   . Chronic constipation   . Chronic dryness of both eyes   . GERD (gastroesophageal reflux disease)   . History of prostatitis   . Hyperlipidemia   . Hypertension   . IDA (iron deficiency anemia)   . OSA on CPAP   . Wears glasses   . Wears hearing aid in both ears     Patient Active Problem List   Diagnosis Date Noted  . Urinary retention 02/04/2018    Past Surgical History:  Procedure Laterality Date  . ANKLE ARTHRODESIS Right 05/27/2008  . CYSTOSCOPY WITH INSERTION OF UROLIFT  05/14/2018  _0  [facility] (MAC anesthesia)  . CYSTOSCOPY WITH INSERTION OF UROLIFT N/A 09/10/2018   Procedure: CYSTOSCOPY WITH INSERTION OF UROLIFT;  Surgeon: Cleon Gustin, MD;  Location: [facility];  Service: Urology;  Laterality: N/A;  . KNEE ARTHROSCOPY W/ MENISCAL REPAIR Left 01-22-2015  _1     Prior to Admission medications    Medication Sig Start Date End Date Taking? Authorizing Provider  ALFUZOSIN HCL ER PO Take by mouth 1 day or 1 dose.   Yes [provider]  cephALEXin (KEFLEX) 500 MG capsule Take 500 mg by mouth 2 (two) times daily. 10/28/19 11/07/19 Yes [provider]  albuterol (PROVENTIL HFA;VENTOLIN HFA) 108 (90 Base) MCG/ACT inhaler Inhale 2 puffs into the lungs every 4 (four) hours as needed for wheezing or shortness of breath.    [provider]  aspirin EC 81 MG tablet Take 81 mg by mouth daily.    [provider]  atorvastatin (LIPITOR) 40 MG tablet Take 20 mg by mouth daily at 6 PM.    [provider]  chlorpheniramine (CHLOR-TRIMETON) 4 MG tablet Take 4 mg by mouth daily as needed for allergies.    [provider]  Cholecalciferol (VITAMIN D) 2000 units CAPS Take 2,000 Units by mouth daily.     [provider]  ferrous sulfate 325 (65 FE) MG tablet Take 325 mg by mouth daily with breakfast.    [provider]  mometasone (ASMANEX) 220 MCG/INH inhaler Inhale 2 puffs into the lungs at bedtime.     [provider]  pantoprazole (PROTONIX) 40 MG tablet Take 40 mg by mouth every morning.  12/20/17   [provider]  Polyethyl Glycol-Propyl Glycol (SYSTANE OP) Apply 1 drop to eye as needed (dry eyes).     [provider]  polyethylene glycol (MIRALAX / GLYCOLAX) packet Take 17 g by  mouth daily.    [provider]  tadalafil (ADCIRCA/CIALIS) 20 MG tablet Take 20 mg by mouth daily as needed for erectile dysfunction.    [provider]  valsartan (DIOVAN) 160 MG tablet Take 160 mg by mouth daily.    [provider]    Allergies Ciprofloxacin, Lisinopril, Loratadine, Minoxidil, Terazosin, Hydralazine, and Omeprazole  Family History  Problem Relation Age of Onset  . Diabetes Mother   . Cancer Father     Social History Social History   Tobacco Use  . Smoking status: Former Smoker     Years: 10.00    Types: Cigarettes    Quit date: 09/06/1986    Years since quitting: 33.1  . Smokeless tobacco: Never Used  Substance Use Topics  . Alcohol use: Not Currently  . Drug use: Never    Review of Systems  Constitutional: Negative for fever. Cardiovascular: Negative for chest pain. Respiratory: Negative for shortness of breath. Gastrointestinal: Negative for abdominal pain, vomiting and diarrhea. Genitourinary: Negative for dysuria. Reports gross hematuria.  Musculoskeletal: Negative for back pain. Skin: Negative for rash. Neurological: Negative for headaches, focal weakness or numbness. ____________________________________________  PHYSICAL EXAM:  VITAL SIGNS: ED Triage Vitals  Enc Vitals Group     BP 10/28/19 2053 (!) 146/84     Pulse Rate 10/28/19 2053 100     Resp 10/28/19 2053 16     Temp 10/28/19 2053 99 F (37.2 C)     Temp Source 10/28/19 2053 Oral     SpO2 10/28/19 2053 97 %     Weight 10/28/19 2050 238 lb (108 kg)     Height 10/28/19 2050 _0  (1.854 m)     Head Circumference --      Peak Flow --      Pain Score 10/28/19 2050 9     Pain Loc --      Pain Edu? --      Excl. in [place]? --     Constitutional: Alert and oriented. Well appearing and in no distress. Head: Normocephalic and atraumatic. Eyes: Conjunctivae are normal. Normal extraocular movements Cardiovascular: Normal rate, regular rhythm. Normal distal pulses. Respiratory: Normal respiratory effort. No wheezes/rales/rhonchi. Gastrointestinal: Soft and nontender. No distention No CVA tenderness GU: Foley in place. Leg bag with dark, grossly bloody urine noted.  Musculoskeletal: Nontender with normal range of motion in all extremities.  Neurologic:  Normal gait without ataxia. Normal speech and language. No gross focal neurologic deficits are appreciated. Skin:  Skin is warm, dry and intact. No rash noted. Psychiatric: Mood and affect are normal. Patient exhibits appropriate insight and  judgment. ____________________________________________  PROCEDURES  Bladder Scan -  <30 cc Replaced leg bag Procedures ____________________________________________  INITIAL IMPRESSION / ASSESSMENT AND PLAN / ED COURSE  Patient with a history of BPH and urinary retention return to the ED for gross hematuria.  Patient was seen by his urologist office this morning, and had his Foley and bag replaced.  He returned due to gross hematuria.  Exam is overall benign at this time.  Patient is discharged with a large gravity bag to use overnight.  He will follow-up with his urologist as planned on Wednesday, or return to the ED as needed.  ----------------------------------------- 12:13 AM on 10/29/2019 -----------------------------------------  Wesley Pert, RN was able to successfully flush the patient's foley and get free-flowing urine into to bag. Patient is discharged, relieved and reassured.   Wesley Weber was evaluated in Emergency Department on 10/28/2019 for the  symptoms described in the history of present illness. He was evaluated in the context of the global COVID-19 pandemic, which necessitated consideration that the patient might be at risk for infection with the SARS-CoV-2 virus that causes COVID-19. Institutional protocols and algorithms that pertain to the evaluation of patients at risk for COVID-19 are in a state of rapid change based on information released by regulatory bodies including the CDC and federal and state organizations. These policies and algorithms were followed during the patient's care in the ED. ___________________________________________  FINAL CLINICAL IMPRESSION(S) / ED DIAGNOSES  Final diagnoses:  Urinary retention  Hematuria, unspecified type      Carmie End Dannielle Karvonen, PA-C 10/29/19 Isaac Laud, MD 10/30/19 [phone]

## 2019-10-28 NOTE — ED Triage Notes (Signed)
Pt arrives to ED via POV from home with urinary catheter issues. Pt reports having an indwelling catheter placed today for issues with BPH, and noticed blood in his urine, as well as urine leaking around the catheter around 5-6pm PTA. Pt denies any c/o N/V or fever. Pt is A&O, in NAD; RR even, regular, and unlabored.

## 2019-10-28 NOTE — Discharge Instructions (Addendum)
Keep your appointment with Urology. Return to the ED as needed.

## 2019-10-28 NOTE — ED Notes (Signed)
Per [place], [place]; pt is okay to be seen in [place].

## 2019-11-01 ENCOUNTER — Other Ambulatory Visit: Payer: Self-pay | Admitting: Urology

## 2019-11-01 ENCOUNTER — Other Ambulatory Visit
Admission: RE | Admit: 2019-11-01 | Discharge: 2019-11-01 | Disposition: A | Discharge status: Home / Self Care | Payer: Medicare Other | Source: Ambulatory Visit | Attending: Urology | Admitting: Urology

## 2019-11-01 ENCOUNTER — Encounter (HOSPITAL_BASED_OUTPATIENT_CLINIC_OR_DEPARTMENT_OTHER): Payer: Self-pay | Admitting: Urology

## 2019-11-01 ENCOUNTER — Other Ambulatory Visit: Payer: Self-pay

## 2019-11-01 DIAGNOSIS — Z01812 Encounter for preprocedural laboratory examination: Secondary | ICD-10-CM | POA: Diagnosis present

## 2019-11-01 DIAGNOSIS — Z20828 Contact with and (suspected) exposure to other viral communicable diseases: Secondary | ICD-10-CM | POA: Diagnosis not present

## 2019-11-01 NOTE — Progress Notes (Signed)
Spoke w/ via phone for pre-op interview--- PT Lab needs dos----  No            Lab results------  CBC, BMP done 10-27-2019 in chart/ epic.  Current ekg in chart/ epic COVID test --- 11-01-2019 @ 1345 @[facility]  Arrive at -------  0800 NPO after ------  MN Medications to take morning of surgery -----  Crestor, Oxybutynin, Prilosec am dos w/ sips of water Diabetic medication ----- n/a Patient Special Instructions ----- asked to bring rescue inhaler Pre-Op special Istructions -----  Pt case added today, pre-op orders need second sign.  Pt has foley cathater. Patient verbalized understanding of instructions that were given at this phone interview. Patient denies shortness of breath, chest pain, fever, cough a this phone interview.   Anesthesia :  PCP:  [facility] in [place] and [place] Cardiologist :  no Chest x-ray :  12-23-2018 epic EKG :  12-23-2018 epic Echo : no Stress test:  Per pt had nuclear stress test done at [facility] about 2 years ago, told normal (not available in epic or care everywhere) Cardiac Cath :  no Sleep Study/ CPAP :  Yes/  Yes, every night Fasting Blood Sugar :     N / A  Blood Thinner/ Instructions Maryjane Hurter Dose: NO ASA / Instructions/ Last Dose : ASA 81mg /  Per pt last dose 10-25-2019, was told to stop that day due to gross hematuria by his pcp  Patient denies shortness of breath, chest pain, fever, and cough at this phone interview.

## 2019-11-02 LAB — SARS CORONAVIRUS 2 (TAT 6-24 HRS): SARS Coronavirus 2: NEGATIVE

## 2019-11-04 ENCOUNTER — Ambulatory Visit (HOSPITAL_BASED_OUTPATIENT_CLINIC_OR_DEPARTMENT_OTHER): Payer: Medicare Other | Admitting: Anesthesiology

## 2019-11-04 ENCOUNTER — Encounter (HOSPITAL_BASED_OUTPATIENT_CLINIC_OR_DEPARTMENT_OTHER): Payer: Self-pay | Admitting: Urology

## 2019-11-04 ENCOUNTER — Other Ambulatory Visit: Payer: Self-pay

## 2019-11-04 ENCOUNTER — Encounter (HOSPITAL_BASED_OUTPATIENT_CLINIC_OR_DEPARTMENT_OTHER)
Admission: RE | Disposition: A | Discharge status: Home / Self Care | Payer: Self-pay | Source: Home / Self Care | Attending: Urology

## 2019-11-04 ENCOUNTER — Observation Stay (HOSPITAL_BASED_OUTPATIENT_CLINIC_OR_DEPARTMENT_OTHER)
Admission: RE | Admit: 2019-11-04 | Discharge: 2019-11-05 | Disposition: A | Discharge status: Home / Self Care | Payer: Medicare Other | Attending: Urology | Admitting: Urology

## 2019-11-04 DIAGNOSIS — Z888 Allergy status to other drugs, medicaments and biological substances status: Secondary | ICD-10-CM | POA: Insufficient documentation

## 2019-11-04 DIAGNOSIS — Z87891 Personal history of nicotine dependence: Secondary | ICD-10-CM | POA: Insufficient documentation

## 2019-11-04 DIAGNOSIS — R310 Gross hematuria: Secondary | ICD-10-CM | POA: Insufficient documentation

## 2019-11-04 DIAGNOSIS — E785 Hyperlipidemia, unspecified: Secondary | ICD-10-CM | POA: Diagnosis not present

## 2019-11-04 DIAGNOSIS — G4733 Obstructive sleep apnea (adult) (pediatric): Secondary | ICD-10-CM | POA: Diagnosis not present

## 2019-11-04 DIAGNOSIS — N138 Other obstructive and reflux uropathy: Secondary | ICD-10-CM

## 2019-11-04 DIAGNOSIS — J45909 Unspecified asthma, uncomplicated: Secondary | ICD-10-CM | POA: Diagnosis not present

## 2019-11-04 DIAGNOSIS — D509 Iron deficiency anemia, unspecified: Secondary | ICD-10-CM | POA: Insufficient documentation

## 2019-11-04 DIAGNOSIS — K219 Gastro-esophageal reflux disease without esophagitis: Secondary | ICD-10-CM | POA: Diagnosis not present

## 2019-11-04 DIAGNOSIS — N400 Enlarged prostate without lower urinary tract symptoms: Secondary | ICD-10-CM | POA: Diagnosis present

## 2019-11-04 DIAGNOSIS — R339 Retention of urine, unspecified: Secondary | ICD-10-CM

## 2019-11-04 DIAGNOSIS — Z881 Allergy status to other antibiotic agents status: Secondary | ICD-10-CM | POA: Diagnosis not present

## 2019-11-04 DIAGNOSIS — I10 Essential (primary) hypertension: Secondary | ICD-10-CM | POA: Diagnosis not present

## 2019-11-04 HISTORY — DX: Gross hematuria: R31.0

## 2019-11-04 HISTORY — PX: CYSTOSCOPY WITH FULGERATION: SHX6638

## 2019-11-04 HISTORY — DX: Elevated prostate specific antigen (PSA): R97.20

## 2019-11-04 HISTORY — DX: Presence of other specified devices: Z97.8

## 2019-11-04 LAB — POCT I-STAT, CHEM 8
BUN: 17 mg/dL (ref 8–23)
Calcium, Ion: 1.29 mmol/L (ref 1.15–1.40)
Chloride: 105 mmol/L (ref 98–111)
Creatinine, Ser: 1.3 mg/dL — ABNORMAL HIGH (ref 0.61–1.24)
Glucose, Bld: 103 mg/dL — ABNORMAL HIGH (ref 70–99)
HCT: 36 % — ABNORMAL LOW (ref 39.0–52.0)
Hemoglobin: 12.2 g/dL — ABNORMAL LOW (ref 13.0–17.0)
Potassium: 3.8 mmol/L (ref 3.5–5.1)
Sodium: 140 mmol/L (ref 135–145)
TCO2: 24 mmol/L (ref 22–32)

## 2019-11-04 LAB — CBC
HCT: 31.5 % — ABNORMAL LOW (ref 39.0–52.0)
Hemoglobin: 10.2 g/dL — ABNORMAL LOW (ref 13.0–17.0)
MCH: 30.1 pg (ref 26.0–34.0)
MCHC: 32.4 g/dL (ref 30.0–36.0)
MCV: 92.9 fL (ref 80.0–100.0)
Platelets: 244 10*3/uL (ref 150–400)
RBC: 3.39 MIL/uL — ABNORMAL LOW (ref 4.22–5.81)
RDW: 12.3 % (ref 11.5–15.5)
WBC: 12 10*3/uL — ABNORMAL HIGH (ref 4.0–10.5)
nRBC: 0 % (ref 0.0–0.2)

## 2019-11-04 LAB — BASIC METABOLIC PANEL
Anion gap: 9 (ref 5–15)
BUN: 17 mg/dL (ref 8–23)
CO2: 26 mmol/L (ref 22–32)
Calcium: 8.8 mg/dL — ABNORMAL LOW (ref 8.9–10.3)
Chloride: 105 mmol/L (ref 98–111)
Creatinine, Ser: 1.32 mg/dL — ABNORMAL HIGH (ref 0.61–1.24)
GFR calc Af Amer: >60 mL/min (ref >60)
GFR calc non Af Amer: 52 mL/min — ABNORMAL LOW (ref >60)
Glucose, Bld: 156 mg/dL — ABNORMAL HIGH (ref 70–99)
Potassium: 4.4 mmol/L (ref 3.5–5.1)
Sodium: 140 mmol/L (ref 135–145)

## 2019-11-04 SURGERY — CYSTOSCOPY, WITH BLADDER FULGURATION
Anesthesia: General | Site: Bladder

## 2019-11-04 MED ORDER — OXYCODONE HCL 5 MG/5ML PO SOLN
5.0000 mg | Freq: Once | ORAL | Status: DC | PRN
  Filled 2019-11-04: qty 5

## 2019-11-04 MED ORDER — LACTATED RINGERS IV SOLN
INTRAVENOUS | Status: DC
  Administered 2019-11-04: 50 mL/h via INTRAVENOUS
  Administered 2019-11-04: 11:00:00 via INTRAVENOUS
  Filled 2019-11-04: qty 1000

## 2019-11-04 MED ORDER — ASPIRIN 81 MG PO TBEC
81.0000 mg | DELAYED_RELEASE_TABLET | Freq: Every day | ORAL | Status: DC
  Filled 2019-11-04: qty 1

## 2019-11-04 MED ORDER — FENTANYL CITRATE (PF) 100 MCG/2ML IJ SOLN
INTRAMUSCULAR | Status: AC
  Filled 2019-11-04: qty 2

## 2019-11-04 MED ORDER — AMLODIPINE BESYLATE 5 MG PO TABS
5.0000 mg | ORAL_TABLET | Freq: Every day | ORAL | Status: DC
  Filled 2019-11-04: qty 1

## 2019-11-04 MED ORDER — ACETAMINOPHEN 325 MG PO TABS
650.0000 mg | ORAL_TABLET | ORAL | Status: DC | PRN
  Filled 2019-11-04: qty 2

## 2019-11-04 MED ORDER — ALBUTEROL SULFATE HFA 108 (90 BASE) MCG/ACT IN AERS
2.0000 | INHALATION_SPRAY | RESPIRATORY_TRACT | Status: DC | PRN
  Filled 2019-11-04: qty 6.7

## 2019-11-04 MED ORDER — ZOLPIDEM TARTRATE 5 MG PO TABS
5.0000 mg | ORAL_TABLET | Freq: Every evening | ORAL | Status: DC | PRN
  Filled 2019-11-04: qty 1

## 2019-11-04 MED ORDER — STERILE WATER FOR IRRIGATION IR SOLN
Status: DC | PRN
  Administered 2019-11-04: 33000 mL via INTRAVESICAL

## 2019-11-04 MED ORDER — DEXAMETHASONE SODIUM PHOSPHATE 10 MG/ML IJ SOLN
INTRAMUSCULAR | Status: DC | PRN
  Administered 2019-11-04: 4 mg via INTRAVENOUS

## 2019-11-04 MED ORDER — FERROUS SULFATE 325 (65 FE) MG PO TABS
325.0000 mg | ORAL_TABLET | Freq: Every day | ORAL | Status: DC
  Administered 2019-11-04 – 2019-11-05 (×2): 325 mg via ORAL
  Filled 2019-11-04 (×3): qty 1

## 2019-11-04 MED ORDER — PROPOFOL 10 MG/ML IV BOLUS
INTRAVENOUS | Status: AC
  Filled 2019-11-04: qty 20

## 2019-11-04 MED ORDER — LIDOCAINE 2% (20 MG/ML) 5 ML SYRINGE
INTRAMUSCULAR | Status: AC
  Filled 2019-11-04: qty 5

## 2019-11-04 MED ORDER — ROSUVASTATIN CALCIUM 10 MG PO TABS
10.0000 mg | ORAL_TABLET | Freq: Every day | ORAL | Status: DC
  Filled 2019-11-04: qty 1

## 2019-11-04 MED ORDER — OXYCODONE-ACETAMINOPHEN 5-325 MG PO TABS
ORAL_TABLET | ORAL | Status: AC
  Filled 2019-11-04: qty 1

## 2019-11-04 MED ORDER — HYDROMORPHONE HCL 1 MG/ML IJ SOLN
0.5000 mg | INTRAMUSCULAR | Status: DC | PRN
  Filled 2019-11-04: qty 1

## 2019-11-04 MED ORDER — FENTANYL CITRATE (PF) 100 MCG/2ML IJ SOLN
INTRAMUSCULAR | Status: DC | PRN
  Administered 2019-11-04 (×2): 25 ug via INTRAVENOUS
  Administered 2019-11-04 (×2): 50 ug via INTRAVENOUS
  Administered 2019-11-04 (×2): 25 ug via INTRAVENOUS

## 2019-11-04 MED ORDER — CEFAZOLIN SODIUM-DEXTROSE 2-4 GM/100ML-% IV SOLN
2.0000 g | INTRAVENOUS | Status: AC
  Administered 2019-11-04: 2 g via INTRAVENOUS
  Filled 2019-11-04: qty 100

## 2019-11-04 MED ORDER — PHENYLEPHRINE 40 MCG/ML (10ML) SYRINGE FOR IV PUSH (FOR BLOOD PRESSURE SUPPORT)
PREFILLED_SYRINGE | INTRAVENOUS | Status: AC
  Filled 2019-11-04: qty 10

## 2019-11-04 MED ORDER — SODIUM CHLORIDE 0.9 % IR SOLN
3000.0000 mL | Status: DC
  Administered 2019-11-04 – 2019-11-05 (×3): 3000 mL
  Filled 2019-11-04: qty 3000

## 2019-11-04 MED ORDER — FINASTERIDE 5 MG PO TABS
5.0000 mg | ORAL_TABLET | Freq: Every day | ORAL | Status: DC
  Administered 2019-11-04: 22:00:00 5 mg via ORAL
  Filled 2019-11-04 (×2): qty 1

## 2019-11-04 MED ORDER — OXYCODONE HCL 5 MG PO TABS
5.0000 mg | ORAL_TABLET | Freq: Once | ORAL | Status: DC | PRN
  Filled 2019-11-04: qty 1

## 2019-11-04 MED ORDER — FLUTICASONE PROPIONATE HFA 44 MCG/ACT IN AERO
2.0000 | INHALATION_SPRAY | Freq: Two times a day (BID) | RESPIRATORY_TRACT | Status: DC
  Filled 2019-11-04: qty 10.6

## 2019-11-04 MED ORDER — OXYCODONE-ACETAMINOPHEN 5-325 MG PO TABS
1.0000 | ORAL_TABLET | ORAL | Status: DC | PRN
  Administered 2019-11-04 (×2): 1 via ORAL
  Filled 2019-11-04: qty 2

## 2019-11-04 MED ORDER — CEFAZOLIN SODIUM-DEXTROSE 2-4 GM/100ML-% IV SOLN
INTRAVENOUS | Status: AC
  Filled 2019-11-04: qty 100

## 2019-11-04 MED ORDER — ONDANSETRON HCL 4 MG/2ML IJ SOLN
INTRAMUSCULAR | Status: AC
  Filled 2019-11-04: qty 2

## 2019-11-04 MED ORDER — BACITRACIN-NEOMYCIN-POLYMYXIN 400-5-5000 EX OINT
1.0000 "application " | TOPICAL_OINTMENT | Freq: Three times a day (TID) | CUTANEOUS | Status: DC | PRN
  Administered 2019-11-04 (×2): 1 via TOPICAL
  Filled 2019-11-04: qty 1

## 2019-11-04 MED ORDER — DIPHENHYDRAMINE HCL 12.5 MG/5ML PO ELIX
12.5000 mg | ORAL_SOLUTION | Freq: Four times a day (QID) | ORAL | Status: DC | PRN
  Filled 2019-11-04: qty 5

## 2019-11-04 MED ORDER — LIDOCAINE 2% (20 MG/ML) 5 ML SYRINGE
INTRAMUSCULAR | Status: DC | PRN
  Administered 2019-11-04: 100 mg via INTRAVENOUS

## 2019-11-04 MED ORDER — PANTOPRAZOLE SODIUM 40 MG PO TBEC
DELAYED_RELEASE_TABLET | ORAL | Status: AC
  Filled 2019-11-04: qty 1

## 2019-11-04 MED ORDER — ONDANSETRON HCL 4 MG/2ML IJ SOLN
4.0000 mg | Freq: Once | INTRAMUSCULAR | Status: DC | PRN
  Filled 2019-11-04: qty 2

## 2019-11-04 MED ORDER — DEXAMETHASONE SODIUM PHOSPHATE 10 MG/ML IJ SOLN
INTRAMUSCULAR | Status: AC
  Filled 2019-11-04: qty 1

## 2019-11-04 MED ORDER — FENTANYL CITRATE (PF) 100 MCG/2ML IJ SOLN
25.0000 ug | INTRAMUSCULAR | Status: DC | PRN
  Administered 2019-11-04 (×2): 50 ug via INTRAVENOUS
  Filled 2019-11-04: qty 1

## 2019-11-04 MED ORDER — BACITRACIN-NEOMYCIN-POLYMYXIN OINTMENT TUBE
TOPICAL_OINTMENT | CUTANEOUS | Status: AC
  Filled 2019-11-04: qty 14.17

## 2019-11-04 MED ORDER — SODIUM CHLORIDE 0.9 % IV SOLN
INTRAVENOUS | Status: DC
  Filled 2019-11-04: qty 1000

## 2019-11-04 MED ORDER — ONDANSETRON HCL 4 MG/2ML IJ SOLN
4.0000 mg | INTRAMUSCULAR | Status: DC | PRN
  Filled 2019-11-04: qty 2

## 2019-11-04 MED ORDER — ONDANSETRON HCL 4 MG/2ML IJ SOLN
INTRAMUSCULAR | Status: DC | PRN
  Administered 2019-11-04: 4 mg via INTRAVENOUS

## 2019-11-04 MED ORDER — DIPHENHYDRAMINE HCL 50 MG/ML IJ SOLN
12.5000 mg | Freq: Four times a day (QID) | INTRAMUSCULAR | Status: DC | PRN
  Filled 2019-11-04: qty 0.25

## 2019-11-04 MED ORDER — PROPOFOL 10 MG/ML IV BOLUS
INTRAVENOUS | Status: DC | PRN
  Administered 2019-11-04: 50 mg via INTRAVENOUS
  Administered 2019-11-04: 20 mg via INTRAVENOUS
  Administered 2019-11-04: 150 mg via INTRAVENOUS

## 2019-11-04 MED ORDER — PHENYLEPHRINE 40 MCG/ML (10ML) SYRINGE FOR IV PUSH (FOR BLOOD PRESSURE SUPPORT)
PREFILLED_SYRINGE | INTRAVENOUS | Status: DC | PRN
  Administered 2019-11-04: 120 ug via INTRAVENOUS
  Administered 2019-11-04 (×3): 80 ug via INTRAVENOUS
  Administered 2019-11-04: 120 ug via INTRAVENOUS

## 2019-11-04 MED ORDER — PANTOPRAZOLE SODIUM 40 MG PO TBEC
40.0000 mg | DELAYED_RELEASE_TABLET | Freq: Every day | ORAL | Status: DC
  Administered 2019-11-04: 14:00:00 40 mg via ORAL
  Filled 2019-11-04: qty 1

## 2019-11-04 SURGICAL SUPPLY — 19 items
BAG DRAIN URO-CYSTO SKYTR STRL (DRAIN) ×3 IMPLANT
CATH FOLEY 3WAY 30CC 22FR (CATHETERS) ×3 IMPLANT
CLOTH BEACON ORANGE TIMEOUT ST (SAFETY) ×3 IMPLANT
ELECT REM PT RETURN 9FT ADLT (ELECTROSURGICAL) ×3
ELECTRODE REM PT RTRN 9FT ADLT (ELECTROSURGICAL) ×1 IMPLANT
GLOVE BIO SURGEON STRL SZ8 (GLOVE) ×3 IMPLANT
GOWN STRL REUS W/TWL XL LVL3 (GOWN DISPOSABLE) ×3 IMPLANT
IV NS IRRIG 3000ML ARTHROMATIC (IV SOLUTION) ×39 IMPLANT
KIT TURNOVER CYSTO (KITS) ×3 IMPLANT
LOOP CUT BIPOLAR 24F LRG (ELECTROSURGICAL) ×3 IMPLANT
MANIFOLD NEPTUNE II (INSTRUMENTS) ×3 IMPLANT
NS IRRIG 500ML POUR BTL (IV SOLUTION) ×3 IMPLANT
PACK CYSTO (CUSTOM PROCEDURE TRAY) ×3 IMPLANT
SYR 20ML LL LF (SYRINGE) ×3 IMPLANT
SYRINGE IRR TOOMEY STRL 70CC (SYRINGE) ×3 IMPLANT
TUBE CONNECTING 12'X1/4 (SUCTIONS) ×1
TUBE CONNECTING 12X1/4 (SUCTIONS) ×2 IMPLANT
TUBING UROLOGY SET (TUBING) ×3 IMPLANT
WATER STERILE IRR 3000ML UROMA (IV SOLUTION) ×3 IMPLANT

## 2019-11-04 NOTE — Anesthesia Procedure Notes (Signed)
Procedure Name: LMA Insertion Date/Time: 11/04/2019 10:07 AM Performed by: Mechele Claude, CRNA Pre-anesthesia Checklist: Patient identified, Emergency Drugs available, Suction available and Patient being monitored Patient Re-evaluated:Patient Re-evaluated prior to induction Oxygen Delivery Method: Circle system utilized Preoxygenation: Pre-oxygenation with 100% oxygen Induction Type: IV induction Ventilation: Mask ventilation without difficulty LMA: LMA inserted LMA Size: 5.0 Number of attempts: 1 Airway Equipment and Method: Bite block Placement Confirmation: positive ETCO2 Tube secured with: Tape Dental Injury: Teeth and Oropharynx as per pre-operative assessment

## 2019-11-04 NOTE — Transfer of Care (Signed)
Immediate Anesthesia Transfer of Care Note  Patient: Wesley Weber  Procedure(s) Performed: Procedure(s) (LRB): CYSTOSCOPY WITH TRANSURETHRAL RESECTION OF THE PROSTATE (N/A)  Patient Location: PACU  Anesthesia Type: General  Level of Consciousness: awake, alert  and oriented  Airway & Oxygen Therapy: Patient Spontanous Breathing and Patient connected to nasal cannula oxygen  Post-op Assessment: Report given to PACU RN and Post -op Vital signs reviewed and stable  Post vital signs: Reviewed and stable  Complications: No apparent anesthesia complicationsLast Vitals:  Vitals Value Taken Time  BP 105/67 11/04/19 1203  Temp    Pulse 67 11/04/19 1209  Resp 13 11/04/19 1209  SpO2 100 % 11/04/19 1209  Vitals shown include unvalidated device data.  Last Pain:  Vitals:   11/04/19 0841  TempSrc: Oral  PainSc: 5       Patients Stated Pain Goal: 5 (42/35/36 1443)  Complications:

## 2019-11-04 NOTE — Anesthesia Preprocedure Evaluation (Signed)
Anesthesia Evaluation  Patient identified by MRN, date of birth, ID band Patient awake    Reviewed: Allergy & Precautions, NPO status , Patient's Chart, lab work & pertinent test results  History of Anesthesia Complications Negative for: history of anesthetic complications  Airway Mallampati: I  TM Distance: >3 FB Neck ROM: Full    Dental  (+) Teeth Intact   Pulmonary asthma , sleep apnea and Continuous Positive Airway Pressure Ventilation , former smoker,    Pulmonary exam normal        Cardiovascular hypertension, Pt. on medications Normal cardiovascular exam     Neuro/Psych negative neurological ROS  negative psych ROS   GI/Hepatic Neg liver ROS, GERD  ,  Endo/Other  negative endocrine ROS  Renal/GU Renal InsufficiencyRenal disease  negative genitourinary   Musculoskeletal negative musculoskeletal ROS (+)   Abdominal   Peds  Hematology  (+) anemia ,   Anesthesia Other Findings   Reproductive/Obstetrics                            Anesthesia Physical Anesthesia Plan  ASA: III  Anesthesia Plan: General   Post-op Pain Management:    Induction: Intravenous  PONV Risk Score and Plan: 2 and Ondansetron, Dexamethasone, Treatment may vary due to age or medical condition and Midazolam  Airway Management Planned: LMA  Additional Equipment: None  Intra-op Plan:   Post-operative Plan: Extubation in OR  Informed Consent: I have reviewed the patients History and Physical, chart, labs and discussed the procedure including the risks, benefits and alternatives for the proposed anesthesia with the patient or authorized representative who has indicated his/her understanding and acceptance.     Dental advisory given  Plan Discussed with:   Anesthesia Plan Comments:        Anesthesia Quick Evaluation

## 2019-11-04 NOTE — Anesthesia Postprocedure Evaluation (Signed)
Anesthesia Post Note  Patient: Wesley Weber  Procedure(s) Performed: CYSTOSCOPY WITH TRANSURETHRAL RESECTION OF THE PROSTATE (N/A Bladder)     Patient location during evaluation: PACU Anesthesia Type: General Level of consciousness: awake and alert Pain management: pain level controlled Vital Signs Assessment: post-procedure vital signs reviewed and stable Respiratory status: spontaneous breathing, nonlabored ventilation and respiratory function stable Cardiovascular status: blood pressure returned to baseline and stable Postop Assessment: no apparent nausea or vomiting Anesthetic complications: no    Last Vitals:  Vitals:   11/04/19 1300 11/04/19 1318  BP: 126/60 (!) 110/53  Pulse: 62 64  Resp: 12 16  Temp:  36.9 C  SpO2: 100% 99%    Last Pain:  Vitals:   11/04/19 1318  TempSrc:   PainSc: 9                  Loribeth Katich E Filomena Pokorney

## 2019-11-04 NOTE — Op Note (Signed)
Preoperative diagnosis: BPH with gross hematuria  Postoperative diagnosis: BPH  Procedure: 1 cystoscopy 2. Transurethral resection of the prostate  Attending: Nicolette Bang  Anesthesia: General  Estimated blood loss: Minimal  Drains: 22 French foley  Specimens: 1. Prostate Chips  Antibiotics: Rocephin  Findings: Trilobar prostate enlargement. Significant bleeding from lateral and median lobe. Ureteral orifices in normal anatomic location.   Indications: Patient is a 73 year old male with a history of BPH and gross hematuria.  After discussing treatment options, they decided proceed with cystoscopy, fulgeration and possible TURP.  Procedure her in detail: The patient was brought to the operating room and a brief timeout was done to ensure correct patient, correct procedure, correct site.  General anesthesia was administered patient was placed in dorsal lithotomy position.  Their genitalia was then prepped and draped in usual sterile fashion.  A rigid 71 French cystoscope was passed in the urethra and the bladder.  Bladder was inspected and we noted no masses or lesions.  the ureteral orifices were in the normal orthotopic locations. removed the cystoscope and placed a resectoscope into the bladder. We then turned our attention to the prostate resection. Using the bipolar resectoscope we resected the median lobe first from the bladder neck to the verumontanum. We then started at the 12 oclock position on the left lobe and resection to the 6 o'clock position from the bladder neck to the verumontanum. We then did the same resection of the right lobe. Once the resection was complete we then cauterized individual bleeders. We then removed the prostate chips and sent them for pathology.  We then re-inspected the prostatic fossa and found no residual bleeding.  the bladder was then drained, a 22 French foley was placed and this concluded the procedure which was well tolerated by  patient.  Complications: None  Condition: Stable, extubated, transferred to PACU  Plan: Patient is admitted overnight with continuous bladder irrigation. If their urine is clear tomorrow they will be discharged home and followup in 5 days for foley catheter removal and pathology discussion.

## 2019-11-04 NOTE — H&P (Signed)
Urology Admission H&P  Chief Complaint: gross hematuria  History of Present Illness: Mr Black is a 73yo with a hx of BPH s/p Urolift who developed gross hematuria with clot retention 2 weeks ago. He continues to have gross hematuria. No fevers/chills/sweats  Past Medical History:  Diagnosis Date  . Asthma    followed by [facility]  . Benign localized prostatic hyperplasia with lower urinary tract symptoms (LUTS)    urologist-- [name]--  s/p urolift twice in 2019  . Chronic constipation   . Chronic dryness of both eyes   . Elevated PSA   . Foley catheter in place   . GERD (gastroesophageal reflux disease)   . Gross hematuria   . History of prostatitis   . Hyperlipidemia   . Hypertension    followed by pcp  (11-01-2019 per pt had nuclear stress test done _0  approx. 2018, told is was normal)  . IDA (iron deficiency anemia)   . OSA on CPAP    uses nightly  . Wears glasses   . Wears hearing aid in both ears    Past Surgical History:  Procedure Laterality Date  . ANKLE ARTHRODESIS Right 05/27/2008  . CYSTOSCOPY WITH INSERTION OF UROLIFT  05/14/2018  _1  [facility] (MAC anesthesia)  . CYSTOSCOPY WITH INSERTION OF UROLIFT N/A 09/10/2018   Procedure: CYSTOSCOPY WITH INSERTION OF UROLIFT;  Surgeon: Cleon Gustin, MD;  Location: [facility];  Service: Urology;  Laterality: N/A;  . KNEE ARTHROSCOPY W/ MENISCAL REPAIR Left 01-22-2015  _2     Home Medications:  Current Facility-Administered Medications  Medication Dose Route Frequency Provider Last Rate Last Admin  . ceFAZolin (ANCEF) IVPB 2g/100 mL premix  2 g Intravenous 30 min Pre-Op Sammie Denner, Candee Furbish, MD      . lactated ringers infusion   Intravenous Continuous Josephine Igo, MD 50 mL/hr at 11/04/19 0838 50 mL/hr at 11/04/19 6979   Allergies:  Allergies  Allergen Reactions  . Ciprofloxacin Itching  . Lisinopril Cough  . Loratadine Nausea And Vomiting  . Minoxidil Swelling  .  Terazosin Other (See Comments)  . Hydralazine Rash  . Omeprazole Rash    Family History  Problem Relation Age of Onset  . Diabetes Mother   . Cancer Father    Social History:  reports that he quit smoking about 33 years ago. His smoking use included cigarettes. He quit after 10.00 years of use. He has never used smokeless tobacco. He reports previous alcohol use. He reports that he does not use drugs.  Review of Systems  Genitourinary: Positive for difficulty urinating, frequency and hematuria.  All other systems reviewed and are negative.   Physical Exam:  Vital signs in last 24 hours: Temp:  [97.5 F (36.4 C)] 97.5 F (36.4 C) (12/14 0841) Pulse Rate:  [71] 71 (12/14 0841) Resp:  [14] 14 (12/14 0841) BP: (159)/(76) 159/76 (12/14 0841) SpO2:  [97 %] 97 % (12/14 0841) Weight:  [105.4 kg] 105.4 kg (12/14 0841) Physical Exam  Constitutional: He is oriented to person, place, and time. He appears well-developed and well-nourished.  HENT:  Head: Normocephalic and atraumatic.  Eyes: Pupils are equal, round, and reactive to light. EOM are normal.  Neck: No thyromegaly present.  Cardiovascular: Normal rate and regular rhythm.  Respiratory: Effort normal. No respiratory distress.  GI: Soft. He exhibits no distension.  Musculoskeletal:        General: No edema. Normal range of motion.     Cervical back: Normal range of  motion.  Neurological: He is alert and oriented to person, place, and time.  Skin: Skin is warm and dry.  Psychiatric: He has a normal mood and affect. His behavior is normal. Judgment and thought content normal.    Laboratory Data:  Results for orders placed or performed during the hospital encounter of 11/04/19 (from the past 24 hour(s))  I-STAT, chem 8     Status: Abnormal   Collection Time: 11/04/19  8:44 AM  Result Value Ref Range   Sodium 140 135 - 145 mmol/L   Potassium 3.8 3.5 - 5.1 mmol/L   Chloride 105 98 - 111 mmol/L   BUN 17 8 - 23 mg/dL    Creatinine, Ser 1.30 (H) 0.61 - 1.24 mg/dL   Glucose, Bld 103 (H) 70 - 99 mg/dL   Calcium, Ion 1.29 1.15 - 1.40 mmol/L   TCO2 24 22 - 32 mmol/L   Hemoglobin 12.2 (L) 13.0 - 17.0 g/dL   HCT 36.0 (L) 39.0 - 52.0 %   Recent Results (from the past 240 hour(s))  Urine C&S     Status: None   Collection Time: 10/27/19  2:20 AM   Specimen: Urine, Random  Result Value Ref Range Status   Specimen Description   Final    URINE, RANDOM Performed at [facility], [address], [place], [place] [address]    Special Requests   Final    NONE Performed at [facility], [address], [place], [place] [address]    Culture   Final    NO GROWTH Performed at [facility], [place] [address] [place], [place] [address]    Report Status 10/28/2019 FINAL  Final  SARS CORONAVIRUS 2 (TAT 6-24 HRS) Nasopharyngeal Nasopharyngeal Swab     Status: None   Collection Time: 11/01/19  2:17 PM   Specimen: Nasopharyngeal Swab  Result Value Ref Range Status   SARS Coronavirus 2 NEGATIVE NEGATIVE Final    Comment: (NOTE) SARS-CoV-2 target nucleic acids are NOT DETECTED. The SARS-CoV-2 RNA is generally detectable in upper and lower respiratory specimens during the acute phase of infection. Negative results do not preclude SARS-CoV-2 infection, do not rule out co-infections with other pathogens, and should not be used as the sole basis for treatment or other patient management decisions. Negative results must be combined with clinical observations, patient history, and epidemiological information. The expected result is Negative. Fact Sheet for Patients: SugarRoll.be Fact Sheet for Healthcare Providers: https://www.woods-mathews.com/ This test is not yet approved or cleared by the [place] FDA and  has been authorized for detection and/or diagnosis of SARS-CoV-2 by FDA under an Emergency Use Authorization (EUA). This EUA will remain  in  effect (meaning this test can be used) for the duration of the COVID-19 declaration under Section 56 4(b)(1) of the Act, 21 U.S.C. section 360bbb-3(b)(1), unless the authorization is terminated or revoked sooner. Performed at [facility], [place] [address] [place], [place] [address]    Creatinine: Recent Labs    11/04/19 0844  CREATININE 1.30*   Baseline Creatinine: 1.3  Impression/Assessment:  73yo with gross hematuria  Plan:  The risks/benefits/alternatives to cystoscopy with fulgeration was explained to the patient and he understands and wishes to proceed with surgery  Nicolette Bang 11/04/2019, 9:53 AM

## 2019-11-05 DIAGNOSIS — N400 Enlarged prostate without lower urinary tract symptoms: Secondary | ICD-10-CM | POA: Diagnosis not present

## 2019-11-05 LAB — BASIC METABOLIC PANEL
Anion gap: 5 (ref 5–15)
BUN: 15 mg/dL (ref 8–23)
CO2: 25 mmol/L (ref 22–32)
Calcium: 8.5 mg/dL — ABNORMAL LOW (ref 8.9–10.3)
Chloride: 106 mmol/L (ref 98–111)
Creatinine, Ser: 1.21 mg/dL (ref 0.61–1.24)
GFR calc Af Amer: >60 mL/min (ref >60)
GFR calc non Af Amer: 58 mL/min — ABNORMAL LOW (ref >60)
Glucose, Bld: 143 mg/dL — ABNORMAL HIGH (ref 70–99)
Potassium: 4.1 mmol/L (ref 3.5–5.1)
Sodium: 136 mmol/L (ref 135–145)

## 2019-11-05 LAB — CBC
HCT: 25.8 % — ABNORMAL LOW (ref 39.0–52.0)
Hemoglobin: 8.2 g/dL — ABNORMAL LOW (ref 13.0–17.0)
MCH: 30 pg (ref 26.0–34.0)
MCHC: 31.8 g/dL (ref 30.0–36.0)
MCV: 94.5 fL (ref 80.0–100.0)
Platelets: 217 10*3/uL (ref 150–400)
RBC: 2.73 MIL/uL — ABNORMAL LOW (ref 4.22–5.81)
RDW: 12.6 % (ref 11.5–15.5)
WBC: 9.8 10*3/uL (ref 4.0–10.5)
nRBC: 0 % (ref 0.0–0.2)

## 2019-11-05 LAB — SURGICAL PATHOLOGY

## 2019-11-05 MED ORDER — OXYBUTYNIN CHLORIDE 5 MG PO TABS: 5.0000 mg | ORAL_TABLET | Freq: Two times a day (BID) | ORAL | 3 refills | Status: DC

## 2019-11-05 MED ORDER — OXYCODONE-ACETAMINOPHEN 5-325 MG PO TABS: 1.0000 | ORAL_TABLET | ORAL | 0 refills | Status: DC | PRN

## 2019-11-05 MED ORDER — FINASTERIDE 5 MG PO TABS: 5.0000 mg | ORAL_TABLET | Freq: Every day | ORAL | 0 refills | Status: DC

## 2019-11-05 NOTE — Discharge Instructions (Signed)
Transurethral Resection of the Prostate, Care After This sheet gives you information about how to care for yourself after your procedure. Your health care provider may also give you more specific instructions. If you have problems or questions, contact your health care provider. What can I expect after the procedure? After the procedure, it is common to have: Mild pain in your lower abdomen. Soreness or mild discomfort in your penis from having the catheter inserted during the procedure. A feeling of urgency when you need to urinate. A small amount of blood in your urine. You may notice some small blood clots in your urine. These are normal. Follow these instructions at home: Medicines Take over-the-counter and prescription medicines only as told by your health care provider. If you were prescribed an antibiotic medicine, take it as told by your health care provider. Do not stop taking the antibiotic even if you start to feel better. Ask your health care provider if the medicine prescribed to you: Requires you to avoid driving or using heavy machinery. Can cause constipation. You may need to take actions to prevent or treat constipation, such as: Take over-the-counter or prescription medicines. Eat foods that are high in fiber, such as fresh fruits and vegetables, whole grains, and beans. Limit foods that are high in fat and processed sugars, such as fried or sweet foods. Do not drive for 24 hours if you were given a sedative during your procedure. Activity  Return to your normal activities as told by your health care provider. Ask your health care provider what activities are safe for you. Do not lift anything that is heavier than 10 lb (4.5 kg), or the limit that you are told, for 3 weeks after the procedure or until your health care provider says that it is safe. Avoid intense physical activity for as long as told by your health care provider. Avoid sitting for a long time without moving.  Get up and move around one or more times every few hours. This helps to prevent blood clots. You may increase your physical activity gradually as you start to feel better. Lifestyle Do not drink alcohol for as long as told by your health care provider. This is especially important if you are taking prescription pain medicines. Do not engage in sexual activity until your health care provider says that you can do this. General instructions  Do not take baths, swim, or use a hot tub until your health care provider approves. Drink enough fluid to keep your urine pale yellow. Urinate as soon as you feel the need to. Do not try to hold your urine for long periods of time. If your health care provider approves, you may take a stool softener for 2-3 weeks to prevent you from straining to have a bowel movement. Wear compression stockings as told by your health care provider. These stockings help to prevent blood clots and reduce swelling in your legs. Keep all follow-up visits as told by your health care provider. This is important. Contact a health care provider if you have: Difficulty urinating. A fever. Pain that gets worse or does not improve with medicine. Blood in your urine that does not go away after 1 week of resting and drinking more fluids. Swelling in your penis or testicles. Get help right away if: You are unable to urinate. You are having more blood clots in your urine instead of fewer. You have: Large blood clots. A lot of blood in your urine. Pain in your back or  lower abdomen. Pain or swelling in your legs. Chills and you are shaking. Difficulty breathing or shortness of breath. Summary After the procedure, it is common to have a small amount of blood in your urine. Avoid heavy lifting and intense physical activity for as long as told by your health care provider. Urinate as soon as you feel the need to. Do not try to hold your urine for long periods of time. Keep all  follow-up visits as told by your health care provider. This is important. This information is not intended to replace advice given to you by your health care provider. Make sure you discuss any questions you have with your health care provider. Document Released: 11/07/2005 Document Revised: 02/27/2019 Document Reviewed: 08/08/2018 Elsevier Patient Education  2020 [employer] Indwelling Urinary Catheter Care, Adult An indwelling urinary catheter is a thin tube that is put into your bladder. The tube helps to drain pee (urine) out of your body. The tube goes in through your urethra. Your urethra is where pee comes out of your body. Your pee will come out through the catheter, then it will go into a bag (drainage bag). Take good care of your catheter so it will work well. How to wear your catheter and bag Supplies needed  Sticky tape (adhesive tape) or a leg strap.  Alcohol wipe or soap and water (if you use tape).  A clean towel (if you use tape).  Large overnight bag.  Smaller bag (leg bag). Wearing your catheter Attach your catheter to your leg with tape or a leg strap.  Make sure the catheter is not pulled tight.  If a leg strap gets wet, take it off and put on a dry strap.  If you use tape to hold the bag on your leg: 1. Use an alcohol wipe or soap and water to wash your skin where the tape made it sticky before. 2. Use a clean towel to pat-dry that skin. 3. Use new tape to make the bag stay on your leg. Wearing your bags You should have been given a large overnight bag.  You may wear the overnight bag in the day or night.  Always have the overnight bag lower than your bladder.  Do not let the bag touch the floor.  Before you go to sleep, put a clean plastic bag in a wastebasket. Then hang the overnight bag inside the wastebasket. You should also have a smaller leg bag that fits under your clothes.  Always wear the leg bag below your knee.  Do not wear your leg bag at  night. How to care for your skin and catheter Supplies needed  A clean washcloth.  Water and mild soap.  A clean towel. Caring for your skin and catheter      Clean the skin around your catheter every day: ? Wash your hands with soap and water. ? Wet a clean washcloth in warm water and mild soap. ? Clean the skin around your urethra. ? If you are male: ? Gently spread the folds of skin around your vagina (labia). ? With the washcloth in your other hand, wipe the inner side of your labia on each side. Wipe from front to back. ? If you are male: ? Pull back any skin that covers the end of your penis (foreskin). ? With the washcloth in your other hand, wipe your penis in small circles. Start wiping at the tip of your penis, then move away from the catheter. ? Move  the foreskin back in place, if needed. ? With your free hand, hold the catheter close to where it goes into your body. ? Keep holding the catheter during cleaning so it does not get pulled out. ? With the washcloth in your other hand, clean the catheter. ? Only wipe downward on the catheter. ? Do not wipe upward toward your body. Doing this may push germs into your urethra and cause infection. ? Use a clean towel to pat-dry the catheter and the skin around it. Make sure to wipe off all soap. ? Wash your hands with soap and water.  Shower every day. Do not take baths.  Do not use cream, ointment, or lotion on the area where the catheter goes into your body, unless your doctor tells you to.  Do not use powders, sprays, or lotions on your genital area.  Check your skin around the catheter every day for signs of infection. Check for: ? Redness, swelling, or pain. ? Fluid or blood. ? Warmth. ? Pus or a bad smell. How to empty the bag Supplies needed  Rubbing alcohol.  Gauze pad or cotton ball.  Tape or a leg strap. Emptying the bag Pour the pee out of your bag when it is ?- full, or at least 2-3 times a day.  Do this for your overnight bag and your leg bag. 1. Wash your hands with soap and water. 2. Separate (detach) the bag from your leg. 3. Hold the bag over the toilet or a clean pail. Keep the bag lower than your hips and bladder. This is so the pee (urine) does not go back into the tube. 4. Open the pour spout. It is at the bottom of the bag. 5. Empty the pee into the toilet or pail. Do not let the pour spout touch any surface. 6. Put rubbing alcohol on a gauze pad or cotton ball. 7. Use the gauze pad or cotton ball to clean the pour spout. 8. Close the pour spout. 9. Attach the bag to your leg with tape or a leg strap. 10. Wash your hands with soap and water. Follow instructions for cleaning the drainage bag:  From the product maker.  As told by your doctor. How to change the bag Supplies needed  Alcohol wipes.  A clean bag.  Tape or a leg strap. Changing the bag Replace your bag when it starts to leak, smell bad, or look dirty. 1. Wash your hands with soap and water. 2. Separate the dirty bag from your leg. 3. Pinch the catheter with your fingers so that pee does not spill out. 4. Separate the catheter tube from the bag tube where these tubes connect (at the connection valve). Do not let the tubes touch any surface. 5. Clean the end of the catheter tube with an alcohol wipe. Use a different alcohol wipe to clean the end of the bag tube. 6. Connect the catheter tube to the tube of the clean bag. 7. Attach the clean bag to your leg with tape or a leg strap. Do not make the bag tight on your leg. 8. Wash your hands with soap and water. General rules   Never pull on your catheter. Never try to take it out. Doing that can hurt you.  Always wash your hands before and after you touch your catheter or bag. Use a mild, fragrance-free soap. If you do not have soap and water, use hand sanitizer.  Always make sure there are no twists or  bends (kinks) in the catheter tube.  Always make  sure there are no leaks in the catheter or bag.  Drink enough fluid to keep your pee pale yellow.  Do not take baths, swim, or use a hot tub.  If you are male, wipe from front to back after you poop (have a bowel movement). Contact a doctor if:  Your pee is cloudy.  Your pee smells worse than usual.  Your catheter gets clogged.  Your catheter leaks.  Your bladder feels full. Get help right away if:  You have redness, swelling, or pain where the catheter goes into your body.  You have fluid, blood, pus, or a bad smell coming from the area where the catheter goes into your body.  Your skin feels warm where the catheter goes into your body.  You have a fever.  You have pain in your: ? Belly (abdomen). ? Legs. ? Lower back. ? Bladder.  You see blood in the catheter.  Your pee is pink or red.  You feel sick to your stomach (nauseous).  You throw up (vomit).  You have chills.  Your pee is not draining into the bag.  Your catheter gets pulled out. Summary  An indwelling urinary catheter is a thin tube that is placed into the bladder to help drain pee (urine) out of the body.  The catheter is placed into the part of the body that drains pee from the bladder (urethra).  Taking good care of your catheter will keep it working properly and help prevent problems.  Always wash your hands before and after touching your catheter or bag.  Never pull on your catheter or try to take it out. This information is not intended to replace advice given to you by your health care provider. Make sure you discuss any questions you have with your health care provider. Document Released: 03/04/2013 Document Revised: 03/01/2019 Document Reviewed: 06/23/2017 Elsevier Patient Education  2020 [employer].

## 2019-11-16 NOTE — Discharge Summary (Signed)
Physician Discharge Summary  Patient ID: Wesley Weber MRN: [id number] DOB/AGE: 1946-05-02 73 y.o.  Admit date: 11/04/2019 Discharge date: 11/05/2019  Admission Diagnoses:  Gross hematuria  Discharge Diagnoses:  Active Problems:   BPH (benign prostatic hyperplasia)   Past Medical History:  Diagnosis Date  . Asthma    followed by [facility]  . Benign localized prostatic hyperplasia with lower urinary tract symptoms (LUTS)    urologist-- [name]--  s/p urolift twice in 2019  . Chronic constipation   . Chronic dryness of both eyes   . Elevated PSA   . Foley catheter in place   . GERD (gastroesophageal reflux disease)   . Gross hematuria   . History of prostatitis   . Hyperlipidemia   . Hypertension    followed by pcp  (11-01-2019 per pt had nuclear stress test done @[facility]  approx. 2018, told is was normal)  . IDA (iron deficiency anemia)   . OSA on CPAP    uses nightly  . Wears glasses   . Wears hearing aid in both ears     Surgeries: Procedure(s): CYSTOSCOPY WITH TRANSURETHRAL RESECTION OF THE PROSTATE on 11/04/2019   Consultants (if any):   Discharged Condition: Improved  Hospital Course: Wesley Weber is an 73 y.o. male who was admitted 11/04/2019 with a diagnosis of <principal problem not specified> and went to the operating room on 11/04/2019 and underwent the above named procedures.    He was given perioperative antibiotics:  Anti-infectives (From admission, onward)   Start     Dose/Rate Route Frequency Ordered Stop   11/04/19 0847  ceFAZolin (ANCEF) IVPB 2g/100 mL premix     2 g 200 mL/hr over 30 Minutes Intravenous 30 min pre-op 11/04/19 0847 11/04/19 1028    .  He was given sequential compression devices, early ambulation for DVT prophylaxis.  He benefited maximally from the hospital stay and there were no complications.    Recent vital signs:  Vitals:   11/05/19 0613 11/05/19 0840  BP: 130/64 131/62  Pulse: 73 69  Resp: 14 18   Temp: 98.1 F (36.7 C) 98.4 F (36.9 C)  SpO2: 100% 100%    Recent laboratory studies:  Lab Results  Component Value Date   HGB 8.2 (L) 11/05/2019   HGB 10.2 (L) 11/04/2019   HGB 12.2 (L) 11/04/2019   Lab Results  Component Value Date   WBC 9.8 11/05/2019   PLT 217 11/05/2019   Lab Results  Component Value Date   INR 1.05 06/03/2010   Lab Results  Component Value Date   NA 136 11/05/2019   K 4.1 11/05/2019   CL 106 11/05/2019   CO2 25 11/05/2019   BUN 15 11/05/2019   CREATININE 1.21 11/05/2019   GLUCOSE 143 (H) 11/05/2019    Discharge Medications:   Allergies as of 11/05/2019      Reactions   Ciprofloxacin Itching   Lisinopril Cough   Loratadine Nausea And Vomiting   Minoxidil Swelling   Terazosin Other (See Comments)   Hydralazine Rash   Omeprazole Rash      Medication List    STOP taking these medications   alfuzosin 10 MG 24 hr tablet Commonly known as: UROXATRAL     TAKE these medications   albuterol 108 (90 Base) MCG/ACT inhaler Commonly known as: VENTOLIN HFA Inhale 2 puffs into the lungs every 4 (four) hours as needed for wheezing or shortness of breath.   amLODipine 5 MG tablet Commonly known as: NORVASC  Take 5 mg by mouth at bedtime.   aspirin EC 81 MG tablet Take 81 mg by mouth daily.   chlorpheniramine 4 MG tablet Commonly known as: CHLOR-TRIMETON Take 4 mg by mouth daily as needed for allergies.   ferrous sulfate 325 (65 FE) MG tablet Take 325 mg by mouth daily with breakfast.   finasteride 5 MG tablet Commonly known as: PROSCAR Take 1 tablet (5 mg total) by mouth at bedtime.   mometasone 220 MCG/INH inhaler Commonly known as: ASMANEX Inhale 2 puffs into the lungs at bedtime.   omeprazole 40 MG capsule Commonly known as: PRILOSEC Take 40 mg by mouth daily.   oxybutynin 5 MG tablet Commonly known as: DITROPAN Take 1 tablet (5 mg total) by mouth 2 (two) times daily. What changed: when to take this    oxyCODONE-acetaminophen 5-325 MG tablet Commonly known as: PERCOCET/ROXICET Take 1-2 tablets by mouth every 4 (four) hours as needed for moderate pain.   polyethylene glycol 17 g packet Commonly known as: MIRALAX / GLYCOLAX Take 17 g by mouth daily.   rosuvastatin 10 MG tablet Commonly known as: CRESTOR Take 10 mg by mouth daily.   SYSTANE OP Apply 1 drop to eye as needed (dry eyes).   tadalafil 20 MG tablet Commonly known as: CIALIS Take 20 mg by mouth daily as needed for erectile dysfunction.   valsartan 160 MG tablet Commonly known as: DIOVAN Take 160 mg by mouth daily.   Vitamin D 50 MCG (2000 UT) Caps Take 2,000 Units by mouth daily.     ASK your doctor about these medications   cephALEXin 500 MG capsule Commonly known as: KEFLEX Take 500 mg by mouth 2 (two) times daily. Ask about: Should I take this medication?       Diagnostic Studies: No results found.  Disposition: Discharge disposition: 01-Home or Self Care       Discharge Instructions    Discharge patient   Complete by: As directed    Discharge disposition: 01-Home or Self Care   Discharge patient date: 11/05/2019      Follow-up Information    Therma Lasure, Mardene Celeste, MD. Call on 11/08/2019.   Specialty: Urology Why: voiding trial Contact information: [address] [place] [address] [phone]            Signed: Wilkie Aye 11/16/2019, 10:09 AM

## 2020-06-18 DIAGNOSIS — J449 Chronic obstructive pulmonary disease, unspecified: Secondary | ICD-10-CM | POA: Insufficient documentation

## 2020-06-22 ENCOUNTER — Other Ambulatory Visit: Payer: Self-pay

## 2020-06-22 ENCOUNTER — Other Ambulatory Visit
Admission: RE | Admit: 2020-06-22 | Discharge: 2020-06-22 | Disposition: A | Discharge status: Home / Self Care | Payer: Medicare Other | Source: Ambulatory Visit | Attending: Internal Medicine | Admitting: Internal Medicine

## 2020-06-22 DIAGNOSIS — Z20822 Contact with and (suspected) exposure to covid-19: Secondary | ICD-10-CM | POA: Diagnosis not present

## 2020-06-22 DIAGNOSIS — Z01812 Encounter for preprocedural laboratory examination: Secondary | ICD-10-CM | POA: Diagnosis present

## 2020-06-22 LAB — SARS CORONAVIRUS 2 (TAT 6-24 HRS): SARS Coronavirus 2: NEGATIVE

## 2020-06-23 ENCOUNTER — Encounter: Payer: Self-pay | Admitting: Internal Medicine

## 2020-06-24 ENCOUNTER — Encounter
Admission: RE | Disposition: A | Discharge status: Home / Self Care | Payer: Self-pay | Source: Home / Self Care | Attending: Internal Medicine

## 2020-06-24 ENCOUNTER — Ambulatory Visit
Admission: RE | Admit: 2020-06-24 | Discharge: 2020-06-24 | Disposition: A | Discharge status: Home / Self Care | Payer: Medicare Other | Attending: Internal Medicine | Admitting: Internal Medicine

## 2020-06-24 ENCOUNTER — Other Ambulatory Visit: Payer: Self-pay

## 2020-06-24 ENCOUNTER — Ambulatory Visit: Payer: Medicare Other | Admitting: Anesthesiology

## 2020-06-24 ENCOUNTER — Encounter: Payer: Self-pay | Admitting: Internal Medicine

## 2020-06-24 DIAGNOSIS — K5909 Other constipation: Secondary | ICD-10-CM | POA: Diagnosis not present

## 2020-06-24 DIAGNOSIS — Z86010 Personal history of colonic polyps: Secondary | ICD-10-CM | POA: Insufficient documentation

## 2020-06-24 DIAGNOSIS — K219 Gastro-esophageal reflux disease without esophagitis: Secondary | ICD-10-CM | POA: Insufficient documentation

## 2020-06-24 DIAGNOSIS — G4733 Obstructive sleep apnea (adult) (pediatric): Secondary | ICD-10-CM | POA: Diagnosis not present

## 2020-06-24 DIAGNOSIS — E785 Hyperlipidemia, unspecified: Secondary | ICD-10-CM | POA: Insufficient documentation

## 2020-06-24 DIAGNOSIS — Z87891 Personal history of nicotine dependence: Secondary | ICD-10-CM | POA: Diagnosis not present

## 2020-06-24 DIAGNOSIS — K635 Polyp of colon: Secondary | ICD-10-CM | POA: Diagnosis not present

## 2020-06-24 DIAGNOSIS — J45909 Unspecified asthma, uncomplicated: Secondary | ICD-10-CM | POA: Insufficient documentation

## 2020-06-24 DIAGNOSIS — N400 Enlarged prostate without lower urinary tract symptoms: Secondary | ICD-10-CM | POA: Diagnosis not present

## 2020-06-24 DIAGNOSIS — Z1211 Encounter for screening for malignant neoplasm of colon: Secondary | ICD-10-CM | POA: Insufficient documentation

## 2020-06-24 DIAGNOSIS — Z79899 Other long term (current) drug therapy: Secondary | ICD-10-CM | POA: Insufficient documentation

## 2020-06-24 DIAGNOSIS — Z7951 Long term (current) use of inhaled steroids: Secondary | ICD-10-CM | POA: Diagnosis not present

## 2020-06-24 DIAGNOSIS — I10 Essential (primary) hypertension: Secondary | ICD-10-CM | POA: Diagnosis not present

## 2020-06-24 DIAGNOSIS — Z7982 Long term (current) use of aspirin: Secondary | ICD-10-CM | POA: Diagnosis not present

## 2020-06-24 DIAGNOSIS — K641 Second degree hemorrhoids: Secondary | ICD-10-CM | POA: Diagnosis not present

## 2020-06-24 HISTORY — PX: COLONOSCOPY: SHX5424

## 2020-06-24 SURGERY — COLONOSCOPY
Anesthesia: General

## 2020-06-24 MED ORDER — LIDOCAINE HCL (PF) 2 % IJ SOLN
INTRAMUSCULAR | Status: AC
  Filled 2020-06-24: qty 5

## 2020-06-24 MED ORDER — PROPOFOL 10 MG/ML IV BOLUS
INTRAVENOUS | Status: DC | PRN
  Administered 2020-06-24: 80 mg via INTRAVENOUS
  Administered 2020-06-24 (×2): 150 mg via INTRAVENOUS

## 2020-06-24 MED ORDER — SODIUM CHLORIDE 0.9 % IV SOLN: INTRAVENOUS | Status: DC

## 2020-06-24 MED ORDER — PROPOFOL 500 MG/50ML IV EMUL
INTRAVENOUS | Status: AC
  Filled 2020-06-24: qty 50

## 2020-06-24 MED ORDER — GLYCOPYRROLATE 0.2 MG/ML IJ SOLN
INTRAMUSCULAR | Status: AC
  Filled 2020-06-24: qty 1

## 2020-06-24 MED ORDER — LIDOCAINE HCL (CARDIAC) PF 100 MG/5ML IV SOSY
PREFILLED_SYRINGE | INTRAVENOUS | Status: DC | PRN
  Administered 2020-06-24: 100 mg via INTRAVENOUS

## 2020-06-24 MED ORDER — GLYCOPYRROLATE 0.2 MG/ML IJ SOLN
INTRAMUSCULAR | Status: DC | PRN
  Administered 2020-06-24: .1 mg via INTRAVENOUS

## 2020-06-24 NOTE — Anesthesia Preprocedure Evaluation (Signed)
Anesthesia Evaluation  Patient identified by MRN, date of birth, ID band Patient awake    Reviewed: Allergy & Precautions, NPO status , Patient's Chart, lab work & pertinent test results  History of Anesthesia Complications Negative for: history of anesthetic complications  Airway Mallampati: II  TM Distance: >3 FB Neck ROM: Full    Dental  (+) Poor Dentition   Pulmonary asthma , sleep apnea and Continuous Positive Airway Pressure Ventilation , former smoker,    breath sounds clear to auscultation- rhonchi (-) wheezing      Cardiovascular hypertension, Pt. on medications (-) CAD, (-) Past MI, (-) Cardiac Stents and (-) CABG  Rhythm:Regular Rate:Normal - Systolic murmurs and - Diastolic murmurs    Neuro/Psych neg Seizures negative neurological ROS  negative psych ROS   GI/Hepatic Neg liver ROS, GERD  ,  Endo/Other  negative endocrine ROSneg diabetes  Renal/GU negative Renal ROS     Musculoskeletal negative musculoskeletal ROS (+)   Abdominal   Peds  Hematology  (+) anemia ,   Anesthesia Other Findings Past Medical History: No date: Asthma     Comment:  followed by [facility] No date: Benign localized prostatic hyperplasia with lower urinary  tract symptoms (LUTS)     Comment:  urologist-- dr Ronne Binning--  s/p urolift twice in 2019 No date: Chronic constipation No date: Chronic dryness of both eyes No date: Elevated PSA No date: Foley catheter in place No date: GERD (gastroesophageal reflux disease) No date: Gross hematuria No date: History of prostatitis No date: Hyperlipidemia No date: Hypertension     Comment:  followed by pcp  (11-01-2019 per pt had nuclear stress               test done @[facility]  approx. 2018, told is was normal) No date: IDA (iron deficiency anemia) No date: OSA on CPAP     Comment:  uses nightly No date: Wears glasses No date: Wears hearing aid in both ears    Reproductive/Obstetrics                             Anesthesia Physical Anesthesia Plan  ASA: II  Anesthesia Plan: General   Post-op Pain Management:    Induction: Intravenous  PONV Risk Score and Plan: 1 and Propofol infusion  Airway Management Planned: Natural Airway  Additional Equipment:   Intra-op Plan:   Post-operative Plan:   Informed Consent: I have reviewed the patients History and Physical, chart, labs and discussed the procedure including the risks, benefits and alternatives for the proposed anesthesia with the patient or authorized representative who has indicated his/her understanding and acceptance.     Dental advisory given  Plan Discussed with: CRNA and Anesthesiologist  Anesthesia Plan Comments:         Anesthesia Quick Evaluation

## 2020-06-24 NOTE — H&P (Signed)
Outpatient short stay form Pre-procedure 06/24/2020 10:10 AM Wesley Weber K. Wesley Weber, M.D.  Primary Physician: Wesley Weber, M.D.  Reason for visit: Personal history of adenomatous colon polyps (2016)  History of present illness:                            Patient presents for colonoscopy for a personal hx of colon polyps. The patient denies abdominal pain, abnormal weight loss or rectal bleeding.      Current Facility-Administered Medications:  .  0.9 %  sodium chloride infusion, , Intravenous, Continuous, Wesley Weber, Wesley Nearing, MD, Last Rate: 20 mL/hr at 06/24/20 0948, New Bag at 06/24/20 0948  Medications Prior to Admission  Medication Sig Dispense Refill Last Dose  . amLODipine (NORVASC) 5 MG tablet Take 5 mg by mouth at bedtime.   06/23/2020 at Unknown time  . aspirin EC 81 MG tablet Take 81 mg by mouth daily.   Past Week at Unknown time  . Cholecalciferol (VITAMIN D) 2000 units CAPS Take 2,000 Units by mouth daily.    06/23/2020 at Unknown time  . ferrous sulfate 325 (65 FE) MG tablet Take 325 mg by mouth daily with breakfast.   Past Week at Unknown time  . mometasone (ASMANEX) 220 MCG/INH inhaler Inhale 2 puffs into the lungs at bedtime.    06/23/2020 at Unknown time  . omeprazole (PRILOSEC) 40 MG capsule Take 40 mg by mouth daily.   Past Month at Unknown time  . rosuvastatin (CRESTOR) 10 MG tablet Take 10 mg by mouth daily.   06/23/2020 at Unknown time  . valsartan (DIOVAN) 160 MG tablet Take 160 mg by mouth daily.    06/24/2020 at 0600  . albuterol (PROVENTIL HFA;VENTOLIN HFA) 108 (90 Base) MCG/ACT inhaler Inhale 2 puffs into the lungs every 4 (four) hours as needed for wheezing or shortness of breath.     . chlorpheniramine (CHLOR-TRIMETON) 4 MG tablet Take 4 mg by mouth daily as needed for allergies.     . finasteride (PROSCAR) 5 MG tablet Take 1 tablet (5 mg total) by mouth at bedtime. (Patient not taking: Reported on 06/24/2020) 90 tablet 0 Not Taking at Unknown time  . oxybutynin (DITROPAN) 5 MG  tablet Take 1 tablet (5 mg total) by mouth 2 (two) times daily. (Patient not taking: Reported on 06/24/2020) 60 tablet 3 Not Taking at Unknown time  . oxyCODONE-acetaminophen (PERCOCET/ROXICET) 5-325 MG tablet Take 1-2 tablets by mouth every 4 (four) hours as needed for moderate pain. 30 tablet 0   . Polyethyl Glycol-Propyl Glycol (SYSTANE OP) Apply 1 drop to eye as needed (dry eyes).      . polyethylene glycol (MIRALAX / GLYCOLAX) packet Take 17 g by mouth daily.     . tadalafil (ADCIRCA/CIALIS) 20 MG tablet Take 20 mg by mouth daily as needed for erectile dysfunction.        Allergies  Allergen Reactions  . Ciprofloxacin Itching  . Lisinopril Cough  . Loratadine Nausea And Vomiting  . Minoxidil Swelling  . Terazosin Other (See Comments)  . Hydralazine Rash  . Omeprazole Rash     Past Medical History:  Diagnosis Date  . Asthma    followed by [facility]  . Benign localized prostatic hyperplasia with lower urinary tract symptoms (LUTS)    urologist-- dr Wesley Weber--  s/p urolift twice in 2019  . Chronic constipation   . Chronic dryness of both eyes   . Elevated PSA   .  Foley catheter in place   . GERD (gastroesophageal reflux disease)   . Gross hematuria   . History of prostatitis   . Hyperlipidemia   . Hypertension    followed by pcp  (11-01-2019 per pt had nuclear stress test done @[facility]  approx. 2018, told is was normal)  . IDA (iron deficiency anemia)   . OSA on CPAP    uses nightly  . Wears glasses   . Wears hearing aid in both ears     Review of systems:  Otherwise negative.    Physical Exam  Gen: Alert, oriented. Appears stated age.  HEENT: Wesley Weber/AT. PERRLA. Lungs: CTA, no wheezes. CV: RR nl S1, S2. Abd: soft, benign, no masses. BS+ Ext: No edema. Pulses 2+    Planned procedures: Proceed with colonoscopy. The patient understands the nature of the planned procedure, indications, risks, alternatives and potential complications including but not limited to  bleeding, infection, perforation, damage to internal organs and possible oversedation/side effects from anesthesia. The patient agrees and gives consent to proceed.  Please refer to procedure notes for findings, recommendations and patient disposition/instructions.     Wesley Weber K. 2019, M.D. Gastroenterology 06/24/2020  10:10 AM

## 2020-06-24 NOTE — Transfer of Care (Addendum)
Immediate Anesthesia Transfer of Care Note  Patient: Wesley Weber  Procedure(s) Performed: COLONOSCOPY (N/A )  Patient Location: PACU and Endoscopy Unit  Anesthesia Type:MAC  Level of Consciousness: alert  and sedated  Airway & Oxygen Therapy: Patient Spontanous Breathing and Patient connected to nasal cannula oxygen  Post-op Assessment: Report given to RN and Post -op Vital signs reviewed and stable  Post vital signs: Reviewed and stable  Last Vitals:  Vitals Value Taken Time  BP 123/85 06/24/20 1112  Temp 36.1 C 06/24/20 1042  Pulse 99 06/24/20 1120  Resp 17 06/24/20 1119  SpO2 97 % 06/24/20 1120  Vitals shown include unvalidated device data.  Last Pain:  Vitals:   06/24/20 1112  TempSrc:   PainSc: 0-No pain         Complications: No complications documented.

## 2020-06-24 NOTE — Anesthesia Procedure Notes (Signed)
Procedure Name: MAC Date/Time: 06/24/2020 10:20 AM Performed by: Genevie Ann, CRNA Oxygen Delivery Method: Simple face mask

## 2020-06-24 NOTE — Anesthesia Postprocedure Evaluation (Signed)
Anesthesia Post Note  Patient: Wesley Weber  Procedure(s) Performed: COLONOSCOPY (N/A )  Patient location during evaluation: Endoscopy Anesthesia Type: General Level of consciousness: awake and alert and oriented Pain management: pain level controlled Vital Signs Assessment: post-procedure vital signs reviewed and stable Respiratory status: spontaneous breathing, nonlabored ventilation and respiratory function stable Cardiovascular status: blood pressure returned to baseline and stable Postop Assessment: no signs of nausea or vomiting Anesthetic complications: no   No complications documented.   Last Vitals:  Vitals:   06/24/20 1102 06/24/20 1112  BP: 118/69 123/85  Pulse: (!) 50 63  Resp: 15 11  Temp:    SpO2: 100% 100%    Last Pain:  Vitals:   06/24/20 1112  TempSrc:   PainSc: 0-No pain                 Lamees Gable

## 2020-06-24 NOTE — Op Note (Signed)
[facility] Gastroenterology Patient Name: Wesley Weber Procedure Date: 06/24/2020 10:09 AM MRN: [id number] Account #: [id number] Date of Birth: 04-23-1946 Admit Type: Outpatient Age: 74 Room: [facility] ENDO ROOM 3 Gender: Male Note Status: Finalized Procedure:             Colonoscopy Indications:           Surveillance: Personal history of adenomatous polyps                         on last colonoscopy > 5 years ago Providers:             Royce Macadamia K. Onaje Warne MD, MD Medicines:             Propofol per Anesthesia Complications:         No immediate complications. Procedure:             Pre-Anesthesia Assessment:                        - The risks and benefits of the procedure and the                         sedation options and risks were discussed with the                         patient. All questions were answered and informed                         consent was obtained.                        - Patient identification and proposed procedure were                         verified prior to the procedure by the nurse. The                         procedure was verified in the procedure room.                        - ASA Grade Assessment: III - A patient with severe                         systemic disease.                        - After reviewing the risks and benefits, the patient                         was deemed in satisfactory condition to undergo the                         procedure.                        After obtaining informed consent, the colonoscope was                         passed under direct vision. Throughout the procedure,  the patient's blood pressure, pulse, and oxygen                         saturations were monitored continuously. The                         Colonoscope was introduced through the anus and                         advanced to the the cecum, identified by appendiceal                         orifice and ileocecal  valve. The colonoscopy was                         performed without difficulty. The patient tolerated                         the procedure well. The quality of the bowel                         preparation was adequate. The ileocecal valve,                         appendiceal orifice, and rectum were photographed. Findings:      The perianal and digital rectal examinations were normal. Pertinent       negatives include normal sphincter tone and no palpable rectal lesions.      Non-bleeding internal hemorrhoids were found during retroflexion. The       hemorrhoids were Grade II (internal hemorrhoids that prolapse but reduce       spontaneously). Estimated blood loss: none.      A 4 mm polyp was found in the hepatic flexure. The polyp was sessile.       The polyp was removed with a cold biopsy forceps. Resection and       retrieval were complete.      The exam was otherwise without abnormality on direct and retroflexion       views. Impression:            - Non-bleeding internal hemorrhoids.                        - One 4 mm polyp at the hepatic flexure, removed with                         a cold biopsy forceps. Resected and retrieved.                        - The examination was otherwise normal on direct and                         retroflexion views. Recommendation:        - Patient has a contact number available for                         emergencies. The signs and symptoms of potential  delayed complications were discussed with the patient.                         Return to normal activities tomorrow. Written                         discharge instructions were provided to the patient.                        - Resume previous diet.                        - Continue present medications.                        - Await pathology results.                        - Repeat colonoscopy is recommended for surveillance.                         The colonoscopy date  will be determined after                         pathology results from today's exam become available                         for review.                        - Return to GI office PRN.                        - The findings and recommendations were discussed with                         the patient. Procedure Code(s):     --- Professional ---                        [phone], Colonoscopy, flexible; with biopsy, single or                         multiple Diagnosis Code(s):     --- Professional ---                        K64.1, Second degree hemorrhoids                        K63.5, Polyp of colon                        Z86.010, Personal history of colonic polyps CPT copyright 2019 [employer] Medical Association. All rights reserved. The codes documented in this report are preliminary and upon coder review may  be revised to meet current compliance requirements. Stanton Kidney MD, MD 06/24/2020 10:43:02 AM This report has been signed electronically. Number of Addenda: 0 Note Initiated On: 06/24/2020 10:09 AM Scope Withdrawal Time: 0 hours 6 minutes 41 seconds  Total Procedure Duration: 0 hours 10 minutes 57 seconds  Estimated Blood Loss:  Estimated blood loss: none.      [facility]

## 2020-06-24 NOTE — Interval H&P Note (Signed)
History and Physical Interval Note:  06/24/2020 10:10 AM  Wesley Weber  has presented today for surgery, with the diagnosis of PHX OF P0LYPS.  The various methods of treatment have been discussed with the patient and family. After consideration of risks, benefits and other options for treatment, the patient has consented to  Procedure(s): COLONOSCOPY (N/A) as a surgical intervention.  The patient's history has been reviewed, patient examined, no change in status, stable for surgery.  I have reviewed the patient's chart and labs.  Questions were answered to the patient's satisfaction.     [place], [place]

## 2020-06-25 LAB — SURGICAL PATHOLOGY

## 2020-11-06 ENCOUNTER — Emergency Department: Payer: Medicare Other

## 2020-11-06 ENCOUNTER — Other Ambulatory Visit: Payer: Self-pay

## 2020-11-06 ENCOUNTER — Emergency Department
Admission: EM | Admit: 2020-11-06 | Discharge: 2020-11-06 | Disposition: A | Discharge status: Home / Self Care | Payer: Medicare Other | Attending: Student in an Organized Health Care Education/Training Program | Admitting: Student in an Organized Health Care Education/Training Program

## 2020-11-06 DIAGNOSIS — J45909 Unspecified asthma, uncomplicated: Secondary | ICD-10-CM | POA: Insufficient documentation

## 2020-11-06 DIAGNOSIS — H43392 Other vitreous opacities, left eye: Secondary | ICD-10-CM

## 2020-11-06 DIAGNOSIS — G459 Transient cerebral ischemic attack, unspecified: Secondary | ICD-10-CM | POA: Insufficient documentation

## 2020-11-06 DIAGNOSIS — I10 Essential (primary) hypertension: Secondary | ICD-10-CM | POA: Diagnosis not present

## 2020-11-06 DIAGNOSIS — Z7982 Long term (current) use of aspirin: Secondary | ICD-10-CM | POA: Diagnosis not present

## 2020-11-06 DIAGNOSIS — Z87891 Personal history of nicotine dependence: Secondary | ICD-10-CM | POA: Diagnosis not present

## 2020-11-06 DIAGNOSIS — Z79899 Other long term (current) drug therapy: Secondary | ICD-10-CM | POA: Diagnosis not present

## 2020-11-06 MED ORDER — TETRACAINE HCL 0.5 % OP SOLN
2.0000 [drp] | Freq: Once | OPHTHALMIC | Status: AC
  Administered 2020-11-06: 21:00:00 2 [drp] via OPHTHALMIC
  Filled 2020-11-06: qty 4

## 2020-11-06 NOTE — ED Notes (Signed)
Spoke with dr. Cyril Loosen regarding pt's symptoms, no order for head ct received. Dawn charge rn notifiied of possible retinal detachment. Pt to go to room 18hall.

## 2020-11-06 NOTE — ED Notes (Signed)
Visual acuity  Right 20/30 Left 20/25 Both 20/30  Pt does wear glasses at all time, acuity done with glasses on.

## 2020-11-06 NOTE — ED Notes (Signed)
Pt states between 1715 and 1730 he bent over and then began to experience left eye "twirlin" in my eye. Pt states he feels like his vision is impaired but is improving in left eye.. pt without other neuro symptoms.

## 2020-11-06 NOTE — ED Notes (Signed)
Pt sating 81 on RA. Placed on 5 L [place]. Pt requesting meds for pain. RN aware

## 2020-11-06 NOTE — ED Provider Notes (Signed)
[facility] Emergency Department Provider Note    Event Date/Time   First MD Initiated Contact with Patient 11/06/20 2034     (approximate)  I have reviewed the triage vital signs and the nursing notes.   HISTORY  Chief Complaint floaters   HPI Wesley Weber is a 74 y.o. male symptoms as described above presents to the ER for visual changes of left eye.  States he was bending down to pick something out of his refrigerator when he stood up started seeing some spots in his left visual field on the left eye.  Stated when he electrolytes they look like there is some stars or bright-colored lights around that would lasted only a few seconds.  No associated numbness or tingling.  Did have mild discomfort behind the eye that was brief in nature.  No fevers.  No history of stroke.  Denies any chest pain or shortness of breath.  No visual deficits at this time.    Past Medical History:  Diagnosis Date  . Asthma    followed by [facility]  . Benign localized prostatic hyperplasia with lower urinary tract symptoms (LUTS)    urologist-- [name]--  s/p urolift twice in 2019  . Chronic constipation   . Chronic dryness of both eyes   . Elevated PSA   . Foley catheter in place   . GERD (gastroesophageal reflux disease)   . Gross hematuria   . History of prostatitis   . Hyperlipidemia   . Hypertension    followed by pcp  (11-01-2019 per pt had nuclear stress test done _0  approx. 2018, told is was normal)  . IDA (iron deficiency anemia)   . OSA on CPAP    uses nightly  . Wears glasses   . Wears hearing aid in both ears    Family History  Problem Relation Age of Onset  . Diabetes Mother   . Cancer Father    Past Surgical History:  Procedure Laterality Date  . ANKLE ARTHRODESIS Right 05/27/2008  . COLONOSCOPY N/A 06/24/2020   Procedure: COLONOSCOPY;  Surgeon: Toledo, Benay Pike, MD;  Location: [facility] ENDOSCOPY;  Service: Gastroenterology;  Laterality:  N/A;  . CYSTOSCOPY WITH FULGERATION N/A 11/04/2019   Procedure: CYSTOSCOPY WITH TRANSURETHRAL RESECTION OF THE PROSTATE;  Surgeon: Cleon Gustin, MD;  Location: [facility];  Service: Urology;  Laterality: N/A;  30 MINS  . CYSTOSCOPY WITH INSERTION OF UROLIFT  05/14/2018  _1  [facility] office (MAC anesthesia)  . CYSTOSCOPY WITH INSERTION OF UROLIFT N/A 09/10/2018   Procedure: CYSTOSCOPY WITH INSERTION OF UROLIFT;  Surgeon: Cleon Gustin, MD;  Location: [facility];  Service: Urology;  Laterality: N/A;  . KNEE ARTHROSCOPY W/ MENISCAL REPAIR Left 01-22-2015  _2    Patient Active Problem List   Diagnosis Date Noted  . BPH (benign prostatic hyperplasia) 11/04/2019  . Urinary retention 02/04/2018      Prior to Admission medications   Medication Sig Start Date End Date Taking? Authorizing Provider  albuterol (PROVENTIL HFA;VENTOLIN HFA) 108 (90 Base) MCG/ACT inhaler Inhale 2 puffs into the lungs every 4 (four) hours as needed for wheezing or shortness of breath.    [provider]  amLODipine (NORVASC) 5 MG tablet Take 5 mg by mouth at bedtime.    [provider]  aspirin EC 81 MG tablet Take 81 mg by mouth daily.    [provider]  chlorpheniramine (CHLOR-TRIMETON) 4 MG tablet Take 4 mg by mouth  daily as needed for allergies.    [provider]  Cholecalciferol (VITAMIN D) 2000 units CAPS Take 2,000 Units by mouth daily.     [provider]  ferrous sulfate 325 (65 FE) MG tablet Take 325 mg by mouth daily with breakfast.    [provider]  finasteride (PROSCAR) 5 MG tablet Take 1 tablet (5 mg total) by mouth at bedtime. Patient not taking: Reported on 06/24/2020 11/05/19   Cleon Gustin, MD  mometasone [facility]) 220 MCG/INH inhaler Inhale 2 puffs into the lungs at bedtime.     [provider]  omeprazole (PRILOSEC) 40 MG capsule Take 40 mg by mouth daily.    [provider]   oxybutynin (DITROPAN) 5 MG tablet Take 1 tablet (5 mg total) by mouth 2 (two) times daily. Patient not taking: Reported on 06/24/2020 11/05/19   Cleon Gustin, MD  oxyCODONE-acetaminophen (PERCOCET/ROXICET) 5-325 MG tablet Take 1-2 tablets by mouth every 4 (four) hours as needed for moderate pain. 11/05/19   McKenzie, Candee Furbish, MD  Polyethyl Glycol-Propyl Glycol (SYSTANE OP) Apply 1 drop to eye as needed (dry eyes).     [provider]  polyethylene glycol (MIRALAX / GLYCOLAX) packet Take 17 g by mouth daily.    [provider]  rosuvastatin (CRESTOR) 10 MG tablet Take 10 mg by mouth daily.    [provider]  tadalafil (ADCIRCA/CIALIS) 20 MG tablet Take 20 mg by mouth daily as needed for erectile dysfunction.    [provider]  valsartan (DIOVAN) 160 MG tablet Take 160 mg by mouth daily.     [provider]    Allergies Ciprofloxacin, Lisinopril, Loratadine, Minoxidil, Terazosin, Hydralazine, and Omeprazole    Social History Social History   Tobacco Use  . Smoking status: Former Smoker    Years: 10.00    Types: Cigarettes    Quit date: 09/06/1986    Years since quitting: 34.1  . Smokeless tobacco: Never Used  Vaping Use  . Vaping Use: Never used  Substance Use Topics  . Alcohol use: Yes  . Drug use: Never    Review of Systems Patient denies headaches, rhinorrhea, blurry vision, numbness, shortness of breath, chest pain, edema, cough, abdominal pain, nausea, vomiting, diarrhea, dysuria, fevers, rashes or hallucinations unless otherwise stated above in HPI. ____________________________________________   PHYSICAL EXAM:  VITAL SIGNS: Vitals:   11/06/20 2208 11/06/20 2256  BP: (!) 160/68 (!) 155/80  Pulse: 60   Resp: 18   Temp:    SpO2: 100%     Constitutional: Alert and oriented.  Eyes: Conjunctivae are normal.  Normal funduscopy, IOP 15 bilaterally.  EOMI Head: Atraumatic. Nose: No  congestion/rhinnorhea. Mouth/Throat: Mucous membranes are moist.   Neck: No stridor. Painless ROM.  Cardiovascular: Normal rate, regular rhythm. Grossly normal heart sounds.  Good peripheral circulation. Respiratory: Normal respiratory effort.  No retractions. Lungs CTAB. Gastrointestinal: Soft and nontender. No distention. No abdominal bruits. No CVA tenderness. Genitourinary:  Musculoskeletal: No lower extremity tenderness nor edema.  No joint effusions. Neurologic:  Normal speech and language. No gross focal neurologic deficits are appreciated. No facial droop Skin:  Skin is warm, dry and intact. No rash noted. Psychiatric: Mood and affect are normal. Speech and behavior are normal.  ____________________________________________   LABS (all labs ordered are listed, but only abnormal results are displayed)  No results found for this or any previous visit (from the past 24 hour(s)). ____________________________________________ ____________________________________________  RADIOLOGY  I personally reviewed all radiographic  images ordered to evaluate for the above acute complaints and reviewed radiology reports and findings.  These findings were personally discussed with the patient.  Please see medical record for radiology report.  EMERGENCY DEPARTMENT [place] OCULAR EXAM "Study: Limited Ultrasound of Orbit "  INDICATIONS: Floaters/Flashes Linear probe utilized to obtain images in both long and short axis of the orbit having the patient look left and right if possible.  PERFORMED BY: Myself IMAGES ARCHIVED?: No LIMITATIONS: none VIEWS USED: Left orbit INTERPRETATION: No retinal detachment, Lens in proper position, Normal optic nerve diameter   ____________________________________________   PROCEDURES  Procedure(s) performed:  Procedures    Critical Care performed: no ____________________________________________   INITIAL IMPRESSION / ASSESSMENT AND PLAN / ED  COURSE  Pertinent labs & imaging results that were available during my care of the patient were reviewed by me and considered in my medical decision making (see chart for details).   DDX: Detachment, iritis, orbital cellulitis, stroke, amaurosis fugax  FINN AMOS is a 74 y.o. who presents to the ED with presentation as described above.  Patient well-appearing.  No focal deficits at this time.  Given his primary complaint being floaters ultrasound performed which is reassuring do not see any sign of detachment on ultrasound.  Have a lower suspicion for TIA or CVA based on his symptoms.  CT imaging is reassuring.  Clinical Course as of 11/07/20 0010  Fri Nov 06, 2020  2134 Slit-lamp exam with anterior chambers deep and quiet.  I do not see any evidence of vitreous hemorrhage.  Retina appears attached.  No signs of iritis.  Tonometry with pressures 15 bilaterally.  Not consistent with glaucoma.  Is not having any visual symptoms at this time.  Does not seem consistent with amaurosis fugax. [PR]  2228 Visual acuity is equal.  Patient remains well-appearing.  Does appear stable appropriate for outpatient referral.  Patient agreeable plan. [PR]    Clinical Course User Index [PR] Merlyn Lot, MD    The patient was evaluated in Emergency Department today for the symptoms described in the history of present illness. He/she was evaluated in the context of the global COVID-19 pandemic, which necessitated consideration that the patient might be at risk for infection with the SARS-CoV-2 virus that causes COVID-19. Institutional protocols and algorithms that pertain to the evaluation of patients at risk for COVID-19 are in a state of rapid change based on information released by regulatory bodies including the CDC and federal and state organizations. These policies and algorithms were followed during the patient's care in the ED.  As part of my medical decision making, I reviewed the following data  within the [place] notes reviewed and incorporated, Labs reviewed, notes from prior ED visits and [place] Controlled Substance Database   ____________________________________________   FINAL CLINICAL IMPRESSION(S) / ED DIAGNOSES  Final diagnoses:  Floaters in visual field, left      NEW MEDICATIONS STARTED DURING THIS VISIT:  Discharge Medication List as of 11/06/2020 10:28 PM       Note:  This document was prepared using Dragon voice recognition software and may include unintentional dictation errors.    Merlyn Lot, MD 11/07/20 0010

## 2020-11-06 NOTE — ED Notes (Signed)
DC VS declined

## 2020-11-06 NOTE — ED Notes (Signed)
Pt to CT

## 2020-11-06 NOTE — ED Triage Notes (Signed)
Pt states at 1730 he bent over to pick something up and then began to experience left eye floaters of different colors that have not resolved. Pt with intact neuro status with exception of floaters in eye fields.

## 2020-11-06 NOTE — ED Notes (Signed)
This RN triaged pt under wrong screen. Attempt to notify pt's primary RN without answer to phone. Charge rn aware of pt in 18h

## 2020-12-01 ENCOUNTER — Other Ambulatory Visit: Payer: Self-pay | Admitting: Physician Assistant

## 2020-12-01 ENCOUNTER — Ambulatory Visit (INDEPENDENT_AMBULATORY_CARE_PROVIDER_SITE_OTHER): Payer: Medicare Other | Admitting: Physician Assistant

## 2020-12-01 ENCOUNTER — Other Ambulatory Visit: Payer: Self-pay

## 2020-12-01 ENCOUNTER — Encounter: Payer: Self-pay | Admitting: Physician Assistant

## 2020-12-01 VITALS — BP 183/68 | HR 61 | Ht 73.0 in | Wt 236.0 lb

## 2020-12-01 DIAGNOSIS — N529 Male erectile dysfunction, unspecified: Secondary | ICD-10-CM

## 2020-12-01 DIAGNOSIS — R350 Frequency of micturition: Secondary | ICD-10-CM

## 2020-12-01 DIAGNOSIS — N411 Chronic prostatitis: Secondary | ICD-10-CM | POA: Diagnosis not present

## 2020-12-01 DIAGNOSIS — N401 Enlarged prostate with lower urinary tract symptoms: Secondary | ICD-10-CM | POA: Diagnosis not present

## 2020-12-01 DIAGNOSIS — E291 Testicular hypofunction: Secondary | ICD-10-CM

## 2020-12-01 DIAGNOSIS — N400 Enlarged prostate without lower urinary tract symptoms: Secondary | ICD-10-CM

## 2020-12-01 DIAGNOSIS — R310 Gross hematuria: Secondary | ICD-10-CM | POA: Diagnosis not present

## 2020-12-01 DIAGNOSIS — Z87898 Personal history of other specified conditions: Secondary | ICD-10-CM | POA: Diagnosis not present

## 2020-12-01 LAB — BLADDER SCAN AMB NON-IMAGING

## 2020-12-01 MED ORDER — MELOXICAM 7.5 MG PO TABS: 7.5000 mg | ORAL_TABLET | Freq: Every day | ORAL | 0 refills | Status: DC

## 2020-12-01 MED ORDER — OXYBUTYNIN CHLORIDE ER 10 MG PO TB24: 10.0000 mg | ORAL_TABLET | Freq: Every day | ORAL | 0 refills | Status: DC

## 2020-12-01 NOTE — Progress Notes (Unsigned)
12/01/2020 2:14 PM   Wesley Weber Jan 08, 1946 [id number]  CC: Chief Complaint  Patient presents with  . Benign Prostatic Hypertrophy   HPI: Wesley Weber is a 75 y.o. male with a complex urologic history who presents today to transfer care to our office.  He was previously managed by Drs. Antoine Primas, and [place] at [facility].    Patient brings some medical records with him today.  Upon review of this incomplete record, his urologic history is as follows: 1. BPH with LUTS 1. History of urinary retention in 2019 2. UroLift with Dr. Alyson Ingles with placement of 6 implants October 2019 3. TURP with Dr. Alyson Ingles December 2020, surgical pathology with benign prostatic hyperplasia 4. IPSS 12/5 today as below 5. Most bothersome urinary symptom today is straining with urination, worse upon awakening.  He reports nocturia x2. 6. Reports concern regarding recurrent urinary retention today.  Denies urinary retention since undergoing outlet procedures.  Has been instructed in self-catheterization by the [facility] but is not self-cathing currently. 2. Elevated PSA 1. Negative prostate biopsies in 2010 and January 2019 3. Intermittent gross hematuria 1. CT scan dated 08/08/2019 with bilateral renal cysts and no other pathology, unclear if this was a contrast or noncontrast study 2. Cystoscopy with Dr. Milford Cage November 2021 with findings of an obstructing prostate with an enlarged median lobe, moderate hyperplasia, and friable prostatic mucosa with no other abnormalities 4. Hypogonadism with erectile dysfunction 1. Previously deferred testosterone replacement 5. Erectile dysfunction 1. Has Cialis 20 mg tablets at home for as needed use but today reports he is not using them 6. Chronic prostatitis 1. Reports occasional flares, symptoms typically perineal discomfort and testicular pain 2. Has been treated with antibiotics in the past with variable success 3. Reports a 1 month  history of perineal discomfort today 4. Has never undergone pelvic floor PT 5. Underwent testicular ultrasound in late 2021 with findings of multiple left intratesticular cysts and a 1.3 cm right epididymal cyst. 7. Medication allergies/intolerances 1. Patient reports numerous medication allergies and intolerances today, however there are differences in the nature of these reactions when comparing his verbal report to me against his medical record with [facility] versus his [facility] medical record 2. Across 3 sources, he reports allergies and/or intolerances to tamsulosin, silodosin, finasteride, dutasteride, Myrbetriq, and terazosin  Patient was previously on Cialis 5 mg daily for BPH, however this was discontinued for unknown reasons.  Notably, patient reports having recently been prescribed nitrates for chest pain as needed. He is current on 24m aspirin daily and no urologic agents.  In-office UA and microscopy today pan negative. PVR 357m   IPSS    Row Name 12/01/20 1300         International Prostate Symptom Score   How often have you had the sensation of not emptying your bladder? About half the time     How often have you had to urinate less than every two hours? Less than half the time     How often have you found you stopped and started again several times when you urinated? Less than 1 in 5 times     How often have you found it difficult to postpone urination? Less than 1 in 5 times     How often have you had a weak urinary stream? Less than half the time     How often have you had to strain to start urination? Less than 1 in 5 times  How many times did you typically get up at night to urinate? 2 Times     Total IPSS Score 12           Quality of Life due to urinary symptoms   If you were to spend the rest of your life with your urinary condition just the way it is now how would you feel about that? Unhappy             PMH: Past Medical History:  Diagnosis Date   . Asthma    followed by [facility]  . Benign localized prostatic hyperplasia with lower urinary tract symptoms (LUTS)    urologist-- [name]--  s/p urolift twice in 2019  . Chronic constipation   . Chronic dryness of both eyes   . Elevated PSA   . Foley catheter in place   . GERD (gastroesophageal reflux disease)   . Gross hematuria   . History of prostatitis   . Hyperlipidemia   . Hypertension    followed by pcp  (11-01-2019 per pt had nuclear stress test done _0  approx. 2018, told is was normal)  . IDA (iron deficiency anemia)   . OSA on CPAP    uses nightly  . Wears glasses   . Wears hearing aid in both ears     Surgical History: Past Surgical History:  Procedure Laterality Date  . ANKLE ARTHRODESIS Right 05/27/2008  . COLONOSCOPY N/A 06/24/2020   Procedure: COLONOSCOPY;  Surgeon: Toledo, Benay Pike, MD;  Location: [facility] ENDOSCOPY;  Service: Gastroenterology;  Laterality: N/A;  . CYSTOSCOPY WITH FULGERATION N/A 11/04/2019   Procedure: CYSTOSCOPY WITH TRANSURETHRAL RESECTION OF THE PROSTATE;  Surgeon: Cleon Gustin, MD;  Location: [facility];  Service: Urology;  Laterality: N/A;  30 MINS  . CYSTOSCOPY WITH INSERTION OF UROLIFT  05/14/2018  _1  [facility] (MAC anesthesia)  . CYSTOSCOPY WITH INSERTION OF UROLIFT N/A 09/10/2018   Procedure: CYSTOSCOPY WITH INSERTION OF UROLIFT;  Surgeon: Cleon Gustin, MD;  [facility];  Service: Urology;  Laterality: N/A;  . KNEE ARTHROSCOPY W/ MENISCAL REPAIR Left 01-22-2015  _2     Home Medications:  Allergies as of 12/01/2020      Reactions   Ciprofloxacin Itching   Dutasteride    dizziness   Finasteride Other (See Comments)   Dizziness   Lisinopril Cough   Loratadine Nausea And Vomiting   Minoxidil Swelling   Terazosin Other (See Comments)   Hydralazine Rash   Omeprazole Rash      Medication List       Accurate as of December 01, 2020  2:14 PM. If you have  any questions, ask your nurse or doctor.        STOP taking these medications   finasteride 5 MG tablet Commonly known as: PROSCAR Stopped by: Debroah Loop, PA-C     TAKE these medications   albuterol 108 (90 Base) MCG/ACT inhaler Commonly known as: VENTOLIN HFA Inhale 2 puffs into the lungs every 4 (four) hours as needed for wheezing or shortness of breath.   amLODipine 5 MG tablet Commonly known as: NORVASC Take 5 mg by mouth at bedtime.   aspirin EC 81 MG tablet Take 81 mg by mouth daily.   chlorpheniramine 4 MG tablet Commonly known as: CHLOR-TRIMETON Take 4 mg by mouth daily as needed for allergies.   ferrous sulfate 325 (65 FE) MG tablet Take 325 mg by mouth daily with breakfast.   mometasone 220 MCG/INH  inhaler Commonly known as: ASMANEX Inhale 2 puffs into the lungs at bedtime.   omeprazole 40 MG capsule Commonly known as: PRILOSEC Take 40 mg by mouth daily.   oxybutynin 5 MG tablet Commonly known as: DITROPAN Take 1 tablet (5 mg total) by mouth 2 (two) times daily.   oxyCODONE-acetaminophen 5-325 MG tablet Commonly known as: PERCOCET/ROXICET Take 1-2 tablets by mouth every 4 (four) hours as needed for moderate pain.   polyethylene glycol 17 g packet Commonly known as: MIRALAX / GLYCOLAX Take 17 g by mouth daily.   rosuvastatin 10 MG tablet Commonly known as: CRESTOR Take 10 mg by mouth daily.   SYSTANE OP Apply 1 drop to eye as needed (dry eyes).   tadalafil 20 MG tablet Commonly known as: CIALIS Take 20 mg by mouth daily as needed for erectile dysfunction.   valsartan 160 MG tablet Commonly known as: DIOVAN Take 160 mg by mouth daily.   Vitamin D 50 MCG (2000 UT) Caps Take 2,000 Units by mouth daily.       Allergies:  Allergies  Allergen Reactions  . Ciprofloxacin Itching  . Dutasteride     dizziness  . Finasteride Other (See Comments)    Dizziness  . Lisinopril Cough  . Loratadine Nausea And Vomiting  . Minoxidil  Swelling  . Terazosin Other (See Comments)  . Hydralazine Rash  . Omeprazole Rash    Family History: Family History  Problem Relation Age of Onset  . Diabetes Mother   . Cancer Father     Social History:   reports that he quit smoking about 34 years ago. His smoking use included cigarettes. He quit after 10.00 years of use. He has never used smokeless tobacco. He reports current alcohol use. He reports that he does not use drugs.  Physical Exam: BP (!) 183/68   Pulse 61   Ht _0  (1.854 m)   Wt 236 lb (107 kg)   BMI 31.14 kg/m   Constitutional:  Alert and oriented, no acute distress, nontoxic appearing HEENT: [place], AT Cardiovascular: No clubbing, cyanosis, or edema Respiratory: Normal respiratory effort, no increased work of breathing Skin: No rashes, bruises or suspicious lesions Neurologic: Grossly intact, no focal deficits, moving all 4 extremities Psychiatric: Normal mood and affect  Laboratory Data: Results for orders placed or performed in visit on 12/01/20  Microscopic Examination   Urine  Result Value Ref Range   WBC, UA 0-5 0 - 5 /hpf   RBC 0-2 0 - 2 /hpf   Epithelial Cells (non renal) 0-10 0 - 10 /hpf   Bacteria, UA None seen None seen/Few  Urinalysis, Complete  Result Value Ref Range   Specific Gravity, UA 1.020 1.005 - 1.030   pH, UA 5.0 5.0 - 7.5   Color, UA Yellow Yellow   Appearance Ur Clear Clear   Leukocytes,UA Negative Negative   Protein,UA Negative Negative/Trace   Glucose, UA Negative Negative   Ketones, UA Negative Negative   RBC, UA Negative Negative   Bilirubin, UA Negative Negative   Urobilinogen, Ur 0.2 0.2 - 1.0 mg/dL   Nitrite, UA Negative Negative   Microscopic Examination See below:   Bladder Scan (Post Void Residual) in office  Result Value Ref Range   Scan Result 73m    Assessment & Plan:   75year old male with a complex urologic history presents today to transfer care, also with reports of a 1 month history of perineal pain  consistent with chronic prostatitis flare. Medical records release  submitted today for [facility]. 1. Benign prostatic hyperplasia with urinary frequency S/p UroLift and TURP with obstructing, friable prostate on cystoscopy with Dr. Milford Cage last month.  PVR WNL today and UA benign.  Unfortunately, patient reports allergies and/or intolerances to the vast majority of pharmacotherapies available for management of his BPH.  Additionally, daily Cialis is contraindicated now that he has been prescribed PRN nitrates for chest pain.  Will defer further pharmacotherapy for BPH at this time.  Provided reassurance to the patient today that he is emptying his bladder well today.  Reviewed return instructions and warning signs of urinary retention including lower abdominal pain, abdominal distention, frequent/low-volume urination, and the inability to urinate.  He expressed understanding and asks if he should start self cathing.  I counseled him not to self cath unless he is unable to urinate, is having abdominal pain, and it is outside of office hours due to his history of gross hematuria with known friable prostate and concern for elevated risk of new/worsened gross hematuria and clot retention.  Patient expressed understanding. I provided him two catheter samples in clinic today for as needed use in the unlikely case that he should develop urinary retention outside of office hours. - Urinalysis, Complete - Bladder Scan (Post Void Residual) in office  2. Chronic prostatitis Perineal discomfort consistent with possible flare.  Will prescribe 1 month of low-dose Mobic; patient denies a history of PUD, GI bleeds, or CKD.  Additionally prescribed oxybutynin XL 10 mg x 1 month.  We will plan for symptom recheck and PVR with Dr. Erlene Quan in 1 month.  Ultimately, if he remains symptomatic recommend pelvic floor PT. - meloxicam (MOBIC) 7.5 MG tablet; Take 1 tablet (7.5 mg total) by mouth daily.  Dispense: 30 tablet;  Refill: 0  3. Hematuria, gross Likely prostatic in origin given recent reports of friable prostate on cystoscopy, though cross-sectional imaging is not available for review and unclear if this was performed with or without contrast.  Unfortunately, patient reports allergies or intolerances to 5 alpha reductase inhibitors that would reduce his risk of prostatic bleeds.    I counseled the patient on signs and symptoms of gross hematuria requiring urgent evaluation, including thick, ketchup-like urinary output; passage of large clots; dark, maroon-colored urine; lower abdominal pain; lumbar pain; abdominal distention; and the inability to urinate.  I advised him to contact the office for assistance if he develops these symptoms during routine office hours, 8 AM to 5 PM Monday through Friday.  If outside those hours, I advised him to proceed to the emergency department. He expressed understanding.   4. History of elevated PSA Negative biopsies x2.  No further PSA testing indicated per age.  5. Erectile dysfunction, unspecified erectile dysfunction type PDE 5 inhibitors contraindicated now that he has been prescribed nitrates for chest pain.  6. Hypogonadism in male Has deferred testosterone supplementation.  Return in about 4 weeks (around 12/29/2020) for Symptom recheck with PVR with Dr. Erlene Quan.   I spent 136 minutes on the day of the encounter to include pre-visit record review, face-to-face time with the patient, and post-visit ordering of tests.   Debroah Loop, PA-C  [facility] [address] [place], [place] [address] [phone]

## 2020-12-02 ENCOUNTER — Telehealth: Payer: Self-pay

## 2020-12-02 LAB — URINALYSIS, COMPLETE
Bilirubin, UA: NEGATIVE
Glucose, UA: NEGATIVE
Ketones, UA: NEGATIVE
Leukocytes,UA: NEGATIVE
Nitrite, UA: NEGATIVE
Protein,UA: NEGATIVE
RBC, UA: NEGATIVE
Specific Gravity, UA: 1.02 (ref 1.005–1.030)
Urobilinogen, Ur: 0.2 mg/dL (ref 0.2–1.0)
pH, UA: 5 (ref 5.0–7.5)

## 2020-12-02 LAB — MICROSCOPIC EXAMINATION: Bacteria, UA: NONE SEEN

## 2020-12-02 NOTE — Telephone Encounter (Signed)
Patient called stating that he saw in mychart that it was noted he is on PRN nitrates for chest pain and he states this is incorrect and would like [place] to adjust this in his chart.

## 2020-12-03 NOTE — Telephone Encounter (Signed)
Patient reports he has not been prescribed nitrates. Patient reports his doctor telling him that he may take OTC nitrates should patient feel it is necessary.

## 2020-12-03 NOTE — Telephone Encounter (Signed)
Patient reported to me during his visit that he had just been prescribed nitrates for chest pain to use on an as-needed basis. Can you please clarify this with him?

## 2020-12-04 NOTE — Telephone Encounter (Signed)
I am not aware of any OTC nitrates and am concerned that there may be a misunderstanding here. Can he think of the name of any of the nitrates he's referring to?

## 2020-12-08 ENCOUNTER — Telehealth: Payer: Self-pay | Admitting: Physician Assistant

## 2020-12-08 NOTE — Telephone Encounter (Signed)
Meloxicam is more likely to be causing GI upset. Please counsel him to stop this medication, continue oxybutynin, and follow up in clinic as previously scheduled.

## 2020-12-08 NOTE — Telephone Encounter (Signed)
Patient reports the OTC meds he was speaking of was actually "nitric oxide" for blood circulation. Patient also notes he has not started this medication yet.

## 2020-12-08 NOTE — Telephone Encounter (Signed)
Notified patient, patient verbalized understanding with repeat back.

## 2020-12-08 NOTE — Telephone Encounter (Signed)
Patient feels that the medication prescribed (either meloxicam or oxybutynin) is causing acid reflux and upset stomach.  He is looking for some advice.  Patient uses the [facility] on [address] in [place].  Patient can be reached at [phone].

## 2020-12-11 NOTE — Telephone Encounter (Signed)
Patient calling stating that he's taking Oxbutynin and now he's having a hard time starting to urinate. Pt wanting to know if anything can be done? He feels like he's emptying but just a hard time getting starting.

## 2020-12-16 NOTE — Telephone Encounter (Signed)
OK to stop oxybutynin. Unfortunately we've exhausted our pharmaceutical options at this point. Please keep follow-up with Dr. Apolinar Junes as scheduled.

## 2020-12-17 NOTE — Telephone Encounter (Signed)
No DPR on file, LMOM for patient to return call to relay msg. See below.

## 2020-12-31 ENCOUNTER — Other Ambulatory Visit: Payer: Self-pay

## 2020-12-31 ENCOUNTER — Ambulatory Visit (INDEPENDENT_AMBULATORY_CARE_PROVIDER_SITE_OTHER): Payer: Medicare Other | Admitting: Urology

## 2020-12-31 VITALS — BP 130/79 | HR 64 | Ht 73.0 in | Wt 239.0 lb

## 2020-12-31 DIAGNOSIS — N411 Chronic prostatitis: Secondary | ICD-10-CM

## 2020-12-31 DIAGNOSIS — R310 Gross hematuria: Secondary | ICD-10-CM

## 2020-12-31 DIAGNOSIS — N401 Enlarged prostate with lower urinary tract symptoms: Secondary | ICD-10-CM | POA: Diagnosis not present

## 2020-12-31 DIAGNOSIS — R350 Frequency of micturition: Secondary | ICD-10-CM | POA: Diagnosis not present

## 2020-12-31 LAB — URINALYSIS, COMPLETE
Bilirubin, UA: NEGATIVE
Glucose, UA: NEGATIVE
Ketones, UA: NEGATIVE
Leukocytes,UA: NEGATIVE
Nitrite, UA: NEGATIVE
RBC, UA: NEGATIVE
Specific Gravity, UA: 1.02 (ref 1.005–1.030)
Urobilinogen, Ur: 0.2 mg/dL (ref 0.2–1.0)
pH, UA: 5.5 (ref 5.0–7.5)

## 2020-12-31 LAB — MICROSCOPIC EXAMINATION
Bacteria, UA: NONE SEEN
RBC, Urine: NONE SEEN /hpf (ref 0–2)

## 2020-12-31 LAB — BLADDER SCAN AMB NON-IMAGING: Scan Result: 3

## 2020-12-31 NOTE — Progress Notes (Signed)
12/31/2020 5:34 PM   Wesley Weber 1946-11-05 [id number]  Referring provider: Valera Castle, [address]oni [address] [place],  [place] [address]  Chief Complaint  Patient presents with  . Benign Prostatic Hypertrophy    HPI: 75 year old male with extensive GU history he returns today for follow-up.  Notably, he was seen and evaluated by our PA, [name] recently and an exceptional history summary was captured, please refer to this note.  Unfortunately, since last visit, he is not able to tolerate oxybutynin as he felt he is not able to empty his bladder completely.  He is also stopped his NSAIDs as he said that we called on and told him to stop taking it.  His primary complaint today is discomfort in his perineum, especially when sitting on a hard surface.  It sometimes radiates to his testicles.  He sometimes have some urinary urgency and frequency associated this but overall, he is actually doing much better.  His nocturia is also improving.  He is urinary symptoms are not his primary complaint, rather it perineal and scrotal discomfort.  He is not had any gross hematuria in the last 4 weeks.  He mentions today that he self-referred himself to the prostate clinic to [name] for consideration of PAE.  It sounds like he is having his prostate sized next week.  He would like my opinion today regarding this procedure.   PMH: Past Medical History:  Diagnosis Date  . Asthma    followed by [facility]  . Benign localized prostatic hyperplasia with lower urinary tract symptoms (LUTS)    urologist-- [name]--  s/p urolift twice in 2019  . Chronic constipation   . Chronic dryness of both eyes   . Elevated PSA   . Foley catheter in place   . GERD (gastroesophageal reflux disease)   . Gross hematuria   . History of prostatitis   . Hyperlipidemia   . Hypertension    followed by pcp  (11-01-2019 per pt had nuclear stress test done _0  approx. 2018,  told is was normal)  . IDA (iron deficiency anemia)   . OSA on CPAP    uses nightly  . Wears glasses   . Wears hearing aid in both ears     Surgical History: Past Surgical History:  Procedure Laterality Date  . ANKLE ARTHRODESIS Right 05/27/2008  . COLONOSCOPY N/A 06/24/2020   Procedure: COLONOSCOPY;  Surgeon: Toledo, Benay Pike, MD;  Location: [facility];  Service: Gastroenterology;  Laterality: N/A;  . CYSTOSCOPY WITH FULGERATION N/A 11/04/2019   Procedure: CYSTOSCOPY WITH TRANSURETHRAL RESECTION OF THE PROSTATE;  Surgeon: Cleon Gustin, MD;  Location[facility];  Service: Urology;  Laterality: N/A;  30 MINS  . CYSTOSCOPY WITH INSERTION OF UROLIFT  05/14/2018  _1  [facility] (MAC anesthesia)  . CYSTOSCOPY WITH INSERTION OF UROLIFT N/A 09/10/2018   Procedure: CYSTOSCOPY WITH INSERTION OF UROLIFT;  Surgeon: Cleon Gustin, MD;  Location[facility];  Service: Urology;  Laterality: N/A;  . KNEE ARTHROSCOPY W/ MENISCAL REPAIR Left 01-22-2015  _2     Home Medications:  Allergies as of 12/31/2020      Reactions   Ciprofloxacin Itching   Dutasteride    dizziness   Finasteride Other (See Comments)   Dizziness   Lisinopril Cough   Loratadine Nausea And Vomiting   Meloxicam Nausea Only   Minoxidil Swelling   Terazosin Other (See Comments)   Other reaction(s): Dizziness   Hydralazine  Rash   Omeprazole Rash      Medication List       Accurate as of December 31, 2020  5:34 PM. If you have any questions, ask your nurse or doctor.        STOP taking these medications   meloxicam 7.5 MG tablet Commonly known as: MOBIC Stopped by: [name], MD   oxybutynin 10 MG 24 hr tablet Commonly known as: DITROPAN-XL Stopped by: [name], MD     TAKE these medications   albuterol 108 (90 Base) MCG/ACT inhaler Commonly known as: VENTOLIN HFA Inhale 2 puffs into the lungs every 4 (four) hours as needed for wheezing or  shortness of breath.   alfuzosin 10 MG 24 hr tablet Commonly known as: UROXATRAL   amLODipine 5 MG tablet Commonly known as: NORVASC Take 5 mg by mouth at bedtime.   amlodipine-atorvastatin 2.5-10 MG tablet Commonly known as: CADUET Take 1 tablet by mouth daily.   ascorbic acid 500 MG tablet Commonly known as: VITAMIN C Take 1 tablet by mouth daily.   aspirin EC 81 MG tablet Take 81 mg by mouth daily.   chlorpheniramine 4 MG tablet Commonly known as: CHLOR-TRIMETON Take 4 mg by mouth daily as needed for allergies.   dutasteride 0.5 MG capsule Commonly known as: AVODART   ferrous sulfate 325 (65 FE) MG tablet Take 325 mg by mouth daily with breakfast.   finasteride 5 MG tablet Commonly known as: PROSCAR   lisinopril 10 MG tablet Commonly known as: ZESTRIL TAKE ONE-HALF TABLET BY MOUTH ONCE EVERY DAY   loratadine 10 MG tablet Commonly known as: CLARITIN Take 1 tablet by mouth daily.   mometasone 220 MCG/INH inhaler Commonly known as: ASMANEX Inhale 2 puffs into the lungs at bedtime.   montelukast 10 MG tablet Commonly known as: SINGULAIR Take 1 tablet by mouth daily.   omeprazole 40 MG capsule Commonly known as: PRILOSEC Take 40 mg by mouth daily.   oxyCODONE-acetaminophen 5-325 MG tablet Commonly known as: PERCOCET/ROXICET Take 1-2 tablets by mouth every 4 (four) hours as needed for moderate pain.   polyethylene glycol 17 g packet Commonly known as: MIRALAX / GLYCOLAX Take 17 g by mouth daily.   rosuvastatin 20 MG tablet Commonly known as: CRESTOR Take 20 mg by mouth at bedtime. What changed: Another medication with the same name was removed. Continue taking this medication, and follow the directions you see here. Changed by: [name], MD   silodosin 8 MG Caps capsule Commonly known as: RAPAFLO   SYSTANE OP Apply 1 drop to eye as needed (dry eyes).   tadalafil 20 MG tablet Commonly known as: CIALIS Take 20 mg by mouth daily as needed for  erectile dysfunction.   valsartan 160 MG tablet Commonly known as: DIOVAN Take 160 mg by mouth daily.   Vitamin D 50 MCG (2000 UT) Caps Take 2,000 Units by mouth daily.   Vitamin D (Cholecalciferol) 10 MCG (400 UNIT) Tabs       Allergies:  Allergies  Allergen Reactions  . Ciprofloxacin Itching  . Dutasteride     dizziness  . Finasteride Other (See Comments)    Dizziness  . Lisinopril Cough  . Loratadine Nausea And Vomiting  . Meloxicam Nausea Only  . Minoxidil Swelling  . Terazosin Other (See Comments)    Other reaction(s): Dizziness  . Hydralazine Rash  . Omeprazole Rash    Family History: Family History  Problem Relation Age of Onset  . Diabetes Mother   .  Cancer Father     Social History:  reports that he quit smoking about 34 years ago. His smoking use included cigarettes. He quit after 10.00 years of use. He has never used smokeless tobacco. He reports current alcohol use. He reports that he does not use drugs.   Physical Exam: BP 130/79   Pulse 64   Ht _0  (1.854 m)   Wt 239 lb (108.4 kg)   BMI 31.53 kg/m   Constitutional:  Alert and oriented, No acute distress. HEENT: [place] AT, moist mucus membranes.  Trachea midline, no masses. Cardiovascular: No clubbing, cyanosis, or edema. Respiratory: Normal respiratory effort, no increased work of breathing. Skin: No rashes, bruises or suspicious lesions. Neurologic: Grossly intact, no focal deficits, moving all 4 extremities. Psychiatric: Normal mood and affect.  Laboratory Data: Lab Results  Component Value Date   WBC 9.8 11/05/2019   HGB 8.2 (L) 11/05/2019   HCT 25.8 (L) 11/05/2019   MCV 94.5 11/05/2019   PLT 217 11/05/2019    Lab Results  Component Value Date   CREATININE 1.21 11/05/2019   .   Assessment & Plan:    1. Chronic prostatitis Lengthy discussion today with the patient that his primary symptoms sound most consistent with chronic prostatitis including perineal discomfort and  testicular pain  We discussed supportive care including tight fitting underwear, avoiding exacerbating activities, and consideration of pelvic floor therapy which he declined.  Previously tried and failed NSAIDs.  2. Benign prostatic hyperplasia with urinary frequency Lengthy discussion today about his desire to pursue PAE.    Specifically, we discussed that I have no particular expertise regarding this procedure.  My experience comes from much larger glands, greater than 200 g or more for patients were unable or unwilling to undergo a more traditional outlet procedure.  I also cautioned him that at this point, symptoms are primarily irritative and more inflammatory in nature and thus is unclear whether or not he would benefit from this procedure.  I defer completely to Dr. Joellen Jersey and will follow up with the patient in 6 months to help further assess its effect/reassess symptoms at that point.  - BLADDER SCAN AMB NON-IMAGING - Urinalysis, Complete  3. Hematuria, gross Secondary to enlarged prostatic/friable mucosa likely exacerbated by chronic prostatitis  May benefit from PAE for this specific symptom given his inability to tolerate finasteride   [name], MD  [facility] [address], [place], [place] [address] [phone]  I spent 30 total minutes on the day of the encounter including pre-visit review of the medical record, face-to-face time with the patient, and post visit ordering of labs/imaging/tests.

## 2021-07-04 NOTE — Progress Notes (Signed)
07/06/21 3:13 PM   Wesley Weber 1946-09-27 [id number]  Referring provider:  Valera Castle, [place],  [place] [address] Chief Complaint  Patient presents with   Benign Prostatic Hypertrophy     HPI: Wesley Weber is a 75 y.o.male with extensive GU history who returns today for 6 month follow-up with IPSS and PVR.   Las t PSA In 2020 was 5.82.   Patient previously failed Urolift in 2019.   In 12/2020 he has procedure PAE. States his symptoms improved a week after his procedure.  He has been doing very well with IPSS as below.  He is off all medications including finasteride, dutasteride, oxybutynin as well as his alpha-blocker.  He was told by Dr. Ellender Hose to follow-up with his urologist in 6 months after the PAE.  IPSS score 9. PVR 73 mL.   IPSS     Row Name 07/06/21 0900         International Prostate Symptom Score   How often have you had the sensation of not emptying your bladder? Less than 1 in 5     How often have you had to urinate less than every two hours? Less than half the time     How often have you found you stopped and started again several times when you urinated? Less than 1 in 5 times     How often have you found it difficult to postpone urination? Less than 1 in 5 times     How often have you had a weak urinary stream? Less than 1 in 5 times     How often have you had to strain to start urination? Less than half the time     How many times did you typically get up at night to urinate? 1 Time     Total IPSS Score 9           Quality of Life due to urinary symptoms   If you were to spend the rest of your life with your urinary condition just the way it is now how would you feel about that? Pleased              Score:  1-7 Mild 8-19 Moderate 20-35 Severe     PMH: Past Medical History:  Diagnosis Date   Asthma    followed by [facility]   Benign localized prostatic hyperplasia with lower urinary tract  symptoms (LUTS)    urologist-- [name]--  s/p urolift twice in 2019   Chronic constipation    Chronic dryness of both eyes    Elevated PSA    Foley catheter in place    GERD (gastroesophageal reflux disease)    Gross hematuria    History of prostatitis    Hyperlipidemia    Hypertension    followed by pcp  (11-01-2019 per pt had nuclear stress test done _0  approx. 2018, told is was normal)   IDA (iron deficiency anemia)    OSA on CPAP    uses nightly   Wears glasses    Wears hearing aid in both ears     Surgical History: Past Surgical History:  Procedure Laterality Date   ANKLE ARTHRODESIS Right 05/27/2008   COLONOSCOPY N/A 06/24/2020   Procedure: COLONOSCOPY;  Surgeon: Toledo, Benay Pike, MD;  Location: [facility];  Service: Gastroenterology;  Laterality: N/A;   CYSTOSCOPY WITH FULGERATION N/A 11/04/2019   Procedure: CYSTOSCOPY WITH TRANSURETHRAL RESECTION OF THE PROSTATE;  Surgeon:  McKenzie, Candee Furbish, MD;  Location: [facility];  Service: Urology;  Laterality: N/A;  [place]  05/14/2018  _0  [facility] (MAC anesthesia)   CYSTOSCOPY WITH INSERTION OF UROLIFT N/A 09/10/2018   Procedure: CYSTOSCOPY WITH INSERTION OF UROLIFT;  Surgeon: Cleon Gustin, MD;  Location: [facility];  Service: Urology;  Laterality: N/A;   KNEE ARTHROSCOPY W/ MENISCAL REPAIR Left 01-22-2015  _1     Home Medications:  Allergies as of 07/06/2021       Reactions   Ciprofloxacin Itching   Dutasteride    dizziness   Finasteride Other (See Comments)   Dizziness   Lisinopril Cough   Loratadine Nausea And Vomiting   Meloxicam Nausea Only   Minoxidil Swelling   Terazosin Other (See Comments)   Other reaction(s): Dizziness   Hydralazine Rash   Omeprazole Rash        Medication List        Accurate as of July 06, 2021  3:13 PM. If you have any questions, ask your nurse or doctor.          STOP taking  these medications    alfuzosin 10 MG 24 hr tablet Commonly known as: UROXATRAL Stopped by: [name], MD   amLODipine 5 MG tablet Commonly known as: NORVASC Stopped by: [name], MD   amlodipine-atorvastatin 2.5-10 MG tablet Commonly known as: CADUET Stopped by: [name], MD   dutasteride 0.5 MG capsule Commonly known as: AVODART Stopped by: [name], MD   finasteride 5 MG tablet Commonly known as: PROSCAR Stopped by: [name], MD   lisinopril 10 MG tablet Commonly known as: ZESTRIL Stopped by: [name], MD   oxyCODONE-acetaminophen 5-325 MG tablet Commonly known as: PERCOCET/ROXICET Stopped by: [name], MD   polyethylene glycol 17 g packet Commonly known as: MIRALAX / GLYCOLAX Stopped by: [name], MD   silodosin 8 MG Caps capsule Commonly known as: RAPAFLO Stopped by: [name], MD   SYSTANE OP Stopped by: [name], MD   tadalafil 20 MG tablet Commonly known as: CIALIS Stopped by: [name], MD       TAKE these medications    albuterol 108 (90 Base) MCG/ACT inhaler Commonly known as: VENTOLIN HFA Inhale 2 puffs into the lungs every 4 (four) hours as needed for wheezing or shortness of breath.   ascorbic acid 500 MG tablet Commonly known as: VITAMIN C Take 1 tablet by mouth daily.   aspirin EC 81 MG tablet Take 81 mg by mouth daily.   chlorpheniramine 4 MG tablet Commonly known as: CHLOR-TRIMETON Take 4 mg by mouth daily as needed for allergies.   ferrous sulfate 325 (65 FE) MG tablet Take 325 mg by mouth daily with breakfast.   loratadine 10 MG tablet Commonly known as: CLARITIN Take 1 tablet by mouth daily.   mometasone 220 MCG/INH inhaler Commonly known as: ASMANEX Inhale 2 puffs into the lungs at bedtime.   montelukast 10 MG tablet Commonly known as: SINGULAIR Take by mouth. What changed: Another medication with the same name was removed. Continue taking this medication,  and follow the directions you see here. Changed by: [name], MD   omeprazole 40 MG capsule Commonly known as: PRILOSEC Take 40 mg by mouth daily.   oxybutynin 10 MG 24 hr tablet Commonly known as: DITROPAN-XL   rosuvastatin 20 MG tablet Commonly known as: CRESTOR Take 20 mg by mouth at bedtime.  valsartan 160 MG tablet Commonly known as: DIOVAN Take 160 mg by mouth daily.   Vitamin D 50 MCG (2000 UT) Caps Take 2,000 Units by mouth daily.   Vitamin D (Cholecalciferol) 10 MCG (400 UNIT) Tabs        Allergies:  Allergies  Allergen Reactions   Ciprofloxacin Itching   Dutasteride     dizziness   Finasteride Other (See Comments)    Dizziness   Lisinopril Cough   Loratadine Nausea And Vomiting   Meloxicam Nausea Only   Minoxidil Swelling   Terazosin Other (See Comments)    Other reaction(s): Dizziness   Hydralazine Rash   Omeprazole Rash    Family History: Family History  Problem Relation Age of Onset   Diabetes Mother    Cancer Father     Social History:  reports that he quit smoking about 34 years ago. His smoking use included cigarettes. He has never used smokeless tobacco. He reports current alcohol use. He reports that he does not use drugs.   Physical Exam: BP (!) 161/86   Pulse (!) 54   Ht _0  (1.854 m)   Wt 229 lb (103.9 kg)   BMI 30.21 kg/m   Constitutional:  Alert and oriented, No acute distress. HEENT: [place] AT, moist mucus membranes.  Trachea midline, no masses. Cardiovascular: No clubbing, cyanosis, or edema. Respiratory: Normal respiratory effort, no increased work of breathing. Skin: No rashes, bruises or suspicious lesions. Neurologic: Grossly intact, no focal deficits, moving all 4 extremities. Psychiatric: Normal mood and affect.  Laboratory Data:  Lab Results  Component Value Date   CREATININE 1.21 11/05/2019    Pertinent Imaging: Results for orders placed or performed in visit on 07/06/21  BLADDER SCAN AMB NON-IMAGING   Result Value Ref Range   Scan Result 73 ml     Assessment & Plan:    BPH with urinary frequency  -PVR 73 mL  - Symptoms have resolved following PAE  -PSA repeated today for new baseline following cessation of 5 ARI for future comparison if he does develop recurrent symptoms - No longer on any medications  - Will follow-up as needed he is agreeable with this plan   Follow-up as needed   Conley Rolls as a scribe for [name], MD.,have documented all relevant documentation on the behalf of [name], MD,as directed by  [name], MD while in the presence of [name], MD.  I have reviewed the above documentation for accuracy and completeness, and I agree with the above.   [name], MD    [facility] [address], [place], [place] [address] [phone]

## 2021-07-06 ENCOUNTER — Other Ambulatory Visit: Payer: Self-pay

## 2021-07-06 ENCOUNTER — Ambulatory Visit (INDEPENDENT_AMBULATORY_CARE_PROVIDER_SITE_OTHER): Payer: Medicare Other | Admitting: Urology

## 2021-07-06 VITALS — BP 161/86 | HR 54 | Ht 73.0 in | Wt 229.0 lb

## 2021-07-06 DIAGNOSIS — R350 Frequency of micturition: Secondary | ICD-10-CM | POA: Diagnosis not present

## 2021-07-06 DIAGNOSIS — N401 Enlarged prostate with lower urinary tract symptoms: Secondary | ICD-10-CM

## 2021-07-06 LAB — BLADDER SCAN AMB NON-IMAGING: Scan Result: 73

## 2021-07-07 ENCOUNTER — Telehealth: Payer: Self-pay

## 2021-07-07 LAB — PSA: Prostate Specific Ag, Serum: 4.6 ng/mL — ABNORMAL HIGH (ref 0.0–4.0)

## 2021-07-07 NOTE — Telephone Encounter (Signed)
-----   Message from Vanna Scotland, MD sent at 07/07/2021  8:09 AM EDT ----- PSA is down to 4.6.  This will serve as your new baseline PSA should it need to be checked again.  Vanna Scotland, MD

## 2021-10-11 ENCOUNTER — Other Ambulatory Visit: Payer: Self-pay

## 2021-10-11 ENCOUNTER — Emergency Department
Admission: EM | Admit: 2021-10-11 | Discharge: 2021-10-11 | Disposition: A | Discharge status: Home / Self Care | Payer: No Typology Code available for payment source | Attending: Emergency Medicine | Admitting: Emergency Medicine

## 2021-10-11 ENCOUNTER — Emergency Department: Payer: No Typology Code available for payment source

## 2021-10-11 DIAGNOSIS — H538 Other visual disturbances: Secondary | ICD-10-CM | POA: Diagnosis not present

## 2021-10-11 DIAGNOSIS — Z79899 Other long term (current) drug therapy: Secondary | ICD-10-CM | POA: Diagnosis not present

## 2021-10-11 DIAGNOSIS — Y9389 Activity, other specified: Secondary | ICD-10-CM | POA: Insufficient documentation

## 2021-10-11 DIAGNOSIS — S161XXA Strain of muscle, fascia and tendon at neck level, initial encounter: Secondary | ICD-10-CM

## 2021-10-11 DIAGNOSIS — Z7982 Long term (current) use of aspirin: Secondary | ICD-10-CM | POA: Diagnosis not present

## 2021-10-11 DIAGNOSIS — J45909 Unspecified asthma, uncomplicated: Secondary | ICD-10-CM | POA: Insufficient documentation

## 2021-10-11 DIAGNOSIS — Y998 Other external cause status: Secondary | ICD-10-CM | POA: Diagnosis not present

## 2021-10-11 DIAGNOSIS — Z87891 Personal history of nicotine dependence: Secondary | ICD-10-CM | POA: Insufficient documentation

## 2021-10-11 DIAGNOSIS — S46812A Strain of other muscles, fascia and tendons at shoulder and upper arm level, left arm, initial encounter: Secondary | ICD-10-CM | POA: Insufficient documentation

## 2021-10-11 DIAGNOSIS — Y92410 Unspecified street and highway as the place of occurrence of the external cause: Secondary | ICD-10-CM | POA: Insufficient documentation

## 2021-10-11 DIAGNOSIS — I10 Essential (primary) hypertension: Secondary | ICD-10-CM | POA: Insufficient documentation

## 2021-10-11 DIAGNOSIS — S199XXA Unspecified injury of neck, initial encounter: Secondary | ICD-10-CM | POA: Diagnosis present
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
## 2021-10-11 NOTE — Discharge Instructions (Signed)
Call the [facility] today to let them know that you are an add-on for Dr. Inez Pilgrim for tomorrow after being seen in the ER and they will tell you what time to come to the office.  Return to the ER for new, worsening, or persistent severe neck pain, weakness or numbness, worsening vision or eye pain, swelling, or any other new or worsening symptoms that concern you.

## 2021-10-11 NOTE — ED Notes (Signed)
Verbal orders dr Cyril Loosen

## 2021-10-26 ENCOUNTER — Emergency Department: Payer: No Typology Code available for payment source

## 2021-10-26 ENCOUNTER — Emergency Department
Admission: EM | Admit: 2021-10-26 | Discharge: 2021-10-26 | Disposition: A | Discharge status: Home / Self Care | Payer: No Typology Code available for payment source | Attending: Emergency Medicine | Admitting: Emergency Medicine

## 2021-10-26 ENCOUNTER — Other Ambulatory Visit: Payer: Self-pay

## 2021-10-26 DIAGNOSIS — Z7982 Long term (current) use of aspirin: Secondary | ICD-10-CM | POA: Diagnosis not present

## 2021-10-26 DIAGNOSIS — Z7951 Long term (current) use of inhaled steroids: Secondary | ICD-10-CM | POA: Insufficient documentation

## 2021-10-26 DIAGNOSIS — S39012A Strain of muscle, fascia and tendon of lower back, initial encounter: Secondary | ICD-10-CM | POA: Insufficient documentation

## 2021-10-26 DIAGNOSIS — S3992XA Unspecified injury of lower back, initial encounter: Secondary | ICD-10-CM | POA: Diagnosis present

## 2021-10-26 DIAGNOSIS — Y92410 Unspecified street and highway as the place of occurrence of the external cause: Secondary | ICD-10-CM | POA: Insufficient documentation

## 2021-10-26 DIAGNOSIS — J45909 Unspecified asthma, uncomplicated: Secondary | ICD-10-CM | POA: Insufficient documentation

## 2021-10-26 DIAGNOSIS — I10 Essential (primary) hypertension: Secondary | ICD-10-CM | POA: Insufficient documentation

## 2021-10-26 DIAGNOSIS — M545 Low back pain, unspecified: Secondary | ICD-10-CM

## 2021-10-26 DIAGNOSIS — Z87891 Personal history of nicotine dependence: Secondary | ICD-10-CM | POA: Insufficient documentation

## 2021-10-26 DIAGNOSIS — H43392 Other vitreous opacities, left eye: Secondary | ICD-10-CM | POA: Diagnosis not present

## 2021-10-26 DIAGNOSIS — Z79899 Other long term (current) drug therapy: Secondary | ICD-10-CM | POA: Diagnosis not present

## 2021-10-26 MED ORDER — FLUORESCEIN SODIUM 1 MG OP STRP
1.0000 | ORAL_STRIP | Freq: Once | OPHTHALMIC | Status: AC
  Administered 2021-10-26: 1 via OPHTHALMIC
  Filled 2021-10-26: qty 1

## 2021-10-26 MED ORDER — LIDOCAINE 5 % EX PTCH: 1.0000 | MEDICATED_PATCH | Freq: Two times a day (BID) | CUTANEOUS | 0 refills | Status: DC
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
## 2021-10-26 NOTE — ED Triage Notes (Signed)
Pt comes with c/o back pain that started couple days ago. Pt states some left eye pain. Pt states some spots and blurry vision.  Pt denies any CP, dizziness, SOB or headache.
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
## 2021-12-01 ENCOUNTER — Encounter: Payer: Self-pay | Admitting: Emergency Medicine

## 2021-12-01 ENCOUNTER — Other Ambulatory Visit: Payer: Self-pay

## 2021-12-01 ENCOUNTER — Emergency Department
Admission: EM | Admit: 2021-12-01 | Discharge: 2021-12-01 | Disposition: A | Discharge status: Home / Self Care | Payer: Medicare Other | Attending: Emergency Medicine | Admitting: Emergency Medicine

## 2021-12-01 DIAGNOSIS — X58XXXA Exposure to other specified factors, initial encounter: Secondary | ICD-10-CM | POA: Insufficient documentation

## 2021-12-01 DIAGNOSIS — S39012A Strain of muscle, fascia and tendon of lower back, initial encounter: Secondary | ICD-10-CM | POA: Diagnosis not present

## 2021-12-01 DIAGNOSIS — J45909 Unspecified asthma, uncomplicated: Secondary | ICD-10-CM | POA: Insufficient documentation

## 2021-12-01 DIAGNOSIS — S3992XA Unspecified injury of lower back, initial encounter: Secondary | ICD-10-CM | POA: Diagnosis present

## 2021-12-01 DIAGNOSIS — I10 Essential (primary) hypertension: Secondary | ICD-10-CM | POA: Insufficient documentation

## 2021-12-01 MED ORDER — LIDOCAINE 5 % EX PTCH: 1.0000 | MEDICATED_PATCH | Freq: Two times a day (BID) | CUTANEOUS | 0 refills | Status: AC
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
## 2021-12-01 NOTE — ED Notes (Signed)
Sig pad not working. Pt verbalizes understanding of d/c instructions. Denies questions or concerns.

## 2021-12-01 NOTE — ED Provider Notes (Signed)
[facility] Provider Note    Event Date/Time   First MD Initiated Contact with Patient 12/01/21 [phone]     (approximate)   History   Back Pain   HPI  Wesley Weber is a 76 y.o. male with a past history of GERD, hypertension, prostate enlargement who comes ED complaining of left lower back pain that started yesterday afternoon.  Reviewed electronic medical record, seeing the patient is currently under care of physical therapy for chronic right knee pain.  He has been doing well increasing his exercise.  Patient states he was in his usual state of health.  He normally does 2 miles on a treadmill for his exercise routine.  Yesterday , he went on a 5 mile walk through a wooded area, with some hills and uneven terrain.  Denies falls or sudden jerks.  No trauma.  No lower extremity paresthesia or weakness, no bowel or bladder retention or incontinence.   Past Medical History:  Diagnosis Date   Asthma    followed by [facility]   Benign localized prostatic hyperplasia with lower urinary tract symptoms (LUTS)    urologist-- [name]--  s/p urolift twice in 2019   Chronic constipation    Chronic dryness of both eyes    Elevated PSA    Foley catheter in place    GERD (gastroesophageal reflux disease)    Gross hematuria    History of prostatitis    Hyperlipidemia    Hypertension    followed by pcp  (11-01-2019 per pt had nuclear stress test done @[facility]  approx. 2018, told is was normal)   IDA (iron deficiency anemia)    OSA on CPAP    uses nightly   Wears glasses    Wears hearing aid in both ears      Physical Exam   Triage Vital Signs: ED Triage Vitals  Enc Vitals Group     BP 12/01/21 0859 (!) 157/74     Pulse Rate 12/01/21 0859 70     Resp 12/01/21 0859 18     Temp 12/01/21 0859 98.3 F (36.8 C)     Temp Source 12/01/21 0859 Oral     SpO2 12/01/21 0859 97 %     Weight 12/01/21 0900 238 lb 1.6 oz (108 kg)     Height 12/01/21 0900 6'  1" (1.854 m)     Head Circumference --      Peak Flow --      Pain Score 12/01/21 0900 8     Pain Loc --      Pain Edu? --      Excl. in [place]? --     Most recent vital signs: Vitals:   12/01/21 0859  BP: (!) 157/74  Pulse: 70  Resp: 18  Temp: 98.3 F (36.8 C)  SpO2: 97%     General: Awake, no distress.  CV:  Good peripheral perfusion.  Normal DP pulse bilaterally Resp:  Normal effort.  Abd:  No distention.  Soft, nontender Other:  No midline spinal tenderness.  There is tenderness in the left lower back musculature.  Straight leg raise negative bilaterally   ED Results / Procedures / Treatments   Labs (all labs ordered are listed, but only abnormal results are displayed) Labs Reviewed - No data to display   EKG     RADIOLOGY     PROCEDURES:  Critical Care performed: No  Procedures   MEDICATIONS ORDERED IN ED: Medications - No data  to display   IMPRESSION / MDM / [place] / ED COURSE  I reviewed the triage vital signs and the nursing notes.                              Differential diagnosis includes, but is not limited to, muscle strain     Patient presents with left lower back pain.  Exam is reassuring, no evidence of C-spine fracture, central cord syndrome/cauda equina, herniated disc.  Abdomen is soft, doubt intra-abdominal pathology, pyelonephritis, kidney stone, AAA, dissection.  Likely overuse injury/muscle strain due to increased exercise load yesterday.  Recommend Tylenol and lidocaine patch which is worked well for him in the past, follow-up with orthopedics.      FINAL CLINICAL IMPRESSION(S) / ED DIAGNOSES   Final diagnoses:  Strain of lumbar region, initial encounter     Rx / DC Orders   ED Discharge Orders          Ordered    lidocaine (LIDODERM) 5 %  Every 12 hours        12/01/21 0933             Note:  This document was prepared using Dragon voice recognition software and may include unintentional  dictation errors.   Carrie Mew, MD 12/01/21 [phone]

## 2022-01-26 DIAGNOSIS — R001 Bradycardia, unspecified: Secondary | ICD-10-CM | POA: Insufficient documentation

## 2022-03-16 ENCOUNTER — Other Ambulatory Visit (HOSPITAL_COMMUNITY): Payer: Self-pay | Admitting: Family Medicine

## 2022-03-16 DIAGNOSIS — R9431 Abnormal electrocardiogram [ECG] [EKG]: Secondary | ICD-10-CM

## 2022-03-29 ENCOUNTER — Ambulatory Visit (HOSPITAL_COMMUNITY)
Admission: RE | Admit: 2022-03-29 | Discharge: 2022-03-29 | Disposition: A | Discharge status: Home / Self Care | Payer: Medicare Other | Source: Ambulatory Visit | Attending: Family Medicine | Admitting: Family Medicine

## 2022-03-29 DIAGNOSIS — R9431 Abnormal electrocardiogram [ECG] [EKG]: Secondary | ICD-10-CM

## 2022-03-30 LAB — ECHOCARDIOGRAM COMPLETE
Area-P 1/2: 2.66 cm2
Calc EF: 59.7 %
MV M vel: 4.89 m/s
MV Peak grad: 95.6 mmHg
S' Lateral: 3.8 cm
Single Plane A2C EF: 61.6 %
Single Plane A4C EF: 57.5 %

## 2022-04-28 ENCOUNTER — Encounter: Payer: Self-pay | Admitting: Internal Medicine

## 2022-04-28 ENCOUNTER — Ambulatory Visit (INDEPENDENT_AMBULATORY_CARE_PROVIDER_SITE_OTHER): Payer: Medicare Other | Admitting: Internal Medicine

## 2022-04-28 VITALS — BP 126/74 | HR 57 | Ht 73.0 in | Wt 214.8 lb

## 2022-04-28 DIAGNOSIS — R9431 Abnormal electrocardiogram [ECG] [EKG]: Secondary | ICD-10-CM

## 2022-04-28 NOTE — Patient Instructions (Signed)
Medication Instructions:  - Your physician recommends that you continue on your current medications as directed. Please refer to the Current Medication list given to you today.  *If you need a refill on your cardiac medications before your next appointment, please call your pharmacy*   Lab Work: - none ordered  If you have labs (blood work) drawn today and your tests are completely normal, you will receive your results only by: MyChart Message (if you have MyChart) OR A paper copy in the mail If you have any lab test that is abnormal or we need to change your treatment, we will call you to review the results.   Testing/Procedures: - none ordered   Follow-Up: At [facility], you and your health needs are our priority.  As part of our continuing mission to provide you with exceptional heart care, we have created designated Provider Care Teams.  These Care Teams include your primary Cardiologist (physician) and Advanced Practice Providers (APPs -  Physician Assistants and Nurse Practitioners) who all work together to provide you with the care you need, when you need it.  We recommend signing up for the patient portal called "MyChart".  Sign up information is provided on this After Visit Summary.  MyChart is used to connect with patients for Virtual Visits (Telemedicine).  Patients are able to view lab/test results, encounter notes, upcoming appointments, etc.  Non-urgent messages can be sent to your provider as well.   To learn more about what you can do with MyChart, go to [email or web address].    Your next appointment:   1 year(s)  The format for your next appointment:   In Person  Provider:   Steven Klein, MD    Other Instructions N/a  Important Information About Sugar       

## 2022-04-28 NOTE — Progress Notes (Signed)
ELECTROPHYSIOLOGY CONSULT NOTE  Patient ID: Wesley Weber, MRN: [id number], DOB/AGE: 03/17/1946 76 y.o. Admit date: (Not on file) Date of Consult: 04/28/2022  Primary Physician:  [facility] Dr Manuella Ghazi Primary Cardiologist: previously [facility]     Wesley Weber is a 76 y.o. male who is being seen today for the evaluation of abnormal ECG at the request of Dr Manuella Ghazi.    HPI Wesley Weber is a 76 y.o. male with a longstanding history of extra beats, skipped beats, abnormal electrocardiogram Has been previously identified by [facility] as PVCs-unifocal No exercise intolerance.  No syncope.  Occasional tachy palpitations lasting just seconds.  Unassociated with lightheadedness.  No chest pain shortness of breath or edema.  Hypertension recent interval 15 or 20 pound intentional weight loss  DATE TEST EF   12/22 Echo  >55% LAE mild 4.3        Date Cr K Hgb  3/23 1.6 4.1 14.6         Event Recorder by report >> "freq PVCs" and ECG>> unifocal PVCs   Past Medical History:  Diagnosis Date   Asthma    followed by [facility]    Benign localized prostatic hyperplasia with lower urinary tract symptoms (LUTS)    urologist-- [name]--  s/p urolift twice in 2019   Chronic constipation    Chronic dryness of both eyes    Elevated PSA    Foley catheter in place    GERD (gastroesophageal reflux disease)    Gross hematuria    History of prostatitis    Hyperlipidemia    Hypertension    followed by pcp  (11-01-2019 per pt had nuclear stress test done _0  approx. 2018, told is was normal)   IDA (iron deficiency anemia)    OSA on CPAP    uses nightly   Wears glasses    Wears hearing aid in both ears       Surgical History:  Past Surgical History:  Procedure Laterality Date   ANKLE ARTHRODESIS Right 05/27/2008   COLONOSCOPY N/A 06/24/2020   Procedure: COLONOSCOPY;  Surgeon: Toledo, Benay Pike, MD;  Location: [facility] ENDOSCOPY;  Service: Gastroenterology;  Laterality: N/A;    CYSTOSCOPY WITH FULGERATION N/A 11/04/2019   Procedure: CYSTOSCOPY WITH TRANSURETHRAL RESECTION OF THE PROSTATE;  Surgeon: Cleon Gustin, MD;  Location: [facility];  Service: Urology;  Laterality: N/A;  [place]  05/14/2018  _1  [facility] (MAC anesthesia)   CYSTOSCOPY WITH INSERTION OF UROLIFT N/A 09/10/2018   Procedure: CYSTOSCOPY WITH INSERTION OF UROLIFT;  Surgeon: Cleon Gustin, MD;  Location: [facility];  Service: Urology;  Laterality: N/A;   KNEE ARTHROSCOPY W/ MENISCAL REPAIR Left 01-22-2015  _2      Home Meds: Current Meds  Medication Sig   albuterol (PROVENTIL HFA;VENTOLIN HFA) 108 (90 Base) MCG/ACT inhaler Inhale 2 puffs into the lungs every 4 (four) hours as needed for wheezing or shortness of breath.   ascorbic acid (VITAMIN C) 500 MG tablet Take 1 tablet by mouth daily.   aspirin EC 81 MG tablet Take 81 mg by mouth daily.   chlorpheniramine (CHLOR-TRIMETON) 4 MG tablet Take 4 mg by mouth daily as needed for allergies.   Cholecalciferol (VITAMIN D) 2000 units CAPS Take 2,000 Units by mouth daily.    ferrous sulfate 325 (65 FE) MG tablet Take 325 mg by mouth daily with breakfast.   LIVALO 1 MG TABS Take  1 tablet by mouth daily.   loratadine (CLARITIN) 10 MG tablet Take 1 tablet by mouth daily.   mometasone (ASMANEX) 220 MCG/INH inhaler Inhale 2 puffs into the lungs at bedtime.    montelukast (SINGULAIR) 10 MG tablet Take by mouth.   omeprazole (PRILOSEC) 40 MG capsule Take 40 mg by mouth daily.   oxybutynin (DITROPAN-XL) 10 MG 24 hr tablet    rosuvastatin (CRESTOR) 20 MG tablet Take 20 mg by mouth at bedtime.   valsartan (DIOVAN) 160 MG tablet Take 160 mg by mouth daily.    vardenafil (LEVITRA) 20 MG tablet TAKE ONE TABLET BY MOUTH AS INSTRUCTED (TAKE 1 HOUR PRIOR TO SEXUAL ACTIVITY *DO NOT EXCEED 1 DOSE PER 24 HOUR PERIOD*)   Vitamin D, Cholecalciferol, 10 MCG (400 UNIT) TABS      Allergies:  Allergies  Allergen Reactions   Ciprofloxacin Itching   Dutasteride     dizziness   Finasteride Other (See Comments)    Dizziness   Lisinopril Cough   Loratadine Nausea And Vomiting   Meloxicam Nausea Only   Minoxidil Swelling   Terazosin Other (See Comments)    Other reaction(s): Dizziness   Hydralazine Rash   Omeprazole Rash    Social History   Socioeconomic History   Marital status: Married    Spouse name: Not on file   Number of children: Not on file   Years of education: Not on file   Highest education level: Not on file  Occupational History   Not on file  Tobacco Use   Smoking status: Former    Years: 10.00    Types: Cigarettes    Quit date: 09/06/1986    Years since quitting: 35.6   Smokeless tobacco: Never  Vaping Use   Vaping Use: Never used  Substance and Sexual Activity   Alcohol use: Yes   Drug use: Never   Sexual activity: Not on file  Other Topics Concern   Not on file  Social History Narrative   Not on file   Social Determinants of Health   Financial Resource Strain: Not on file  Food Insecurity: Not on file  Transportation Needs: Not on file  Physical Activity: Not on file  Stress: Not on file  Social Connections: Not on file  Intimate Partner Violence: Not on file     Family History  Problem Relation Age of Onset   Diabetes Mother    Cancer Father      ROS:  Please see the history of present illness.     All other systems reviewed and negative.    Physical Exam: Blood pressure 126/74, pulse (!) 57, height _0  (1.854 m), weight 214 lb 12.8 oz (97.4 kg), SpO2 97 %. General: Well developed, well nourished male in no acute distress. Head: Normocephalic, atraumatic, sclera non-icteric, no xanthomas, nares are without discharge. EENT: normal  Lymph Nodes:  none Neck: Negative for carotid bruits. JVD not elevated. Back:without scoliosis kyphosis  Lungs: Clear bilaterally to auscultation without wheezes, rales, or  rhonchi. Breathing is unlabored. Heart: RRR with S1 S2. No murmur . No rubs, or gallops appreciated. Abdomen: Soft, non-tender, non-distended with normoactive bowel sounds. No hepatomegaly. No rebound/guarding. No obvious abdominal masses. Msk:  Strength and tone appear normal for age. Extremities: No clubbing or cyanosis. No + edema.  Distal pedal pulses are 2+ and equal bilaterally.  Prosthetic left ankle Skin: Warm and Dry Neuro: Alert and oriented X 3. CN III-XII intact Grossly normal sensory and motor function .  Psych:  Responds to questions appropriately with a normal affect.        EKG: Sinus at 57 Interval 17/08/41 Otherwise normal ECG from March 15, 2022 demonstrated sinus rhythm with frequent PVCs with a left bundle inferior axis morphology QS morphology in lead aVR and aVL   Assessment and Plan:  PVCs-RVOT  Hypertension  Chronic kidney disease stage 3   The patient has PVCs identified on electrocardiogram and by Holter described as "frequent".  Not withstanding the latter, in the absence of symptoms and the absence of evidence of LV dysfunction there is no indication for treating.  Hence, we will follow.  We have reviewed potential symptoms that could be attributable to PVCs i.e. lightheadedness, progressive exercise intolerance by way of cardiomyopathy or not.  Blood pressure is well controlled currently on his valsartan.  He recently discontinued his amlodipine on his own.  We reviewed the fact that it has about a 2-day half-life and he just needs to keep track of his blood pressure and let us know if it begins to exceed the 130-135 range.  He takes it regularly at home.  Virl Axe
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
## 2022-07-20 DIAGNOSIS — Z96661 Presence of right artificial ankle joint: Secondary | ICD-10-CM | POA: Insufficient documentation

## 2022-08-18 ENCOUNTER — Emergency Department
Admission: EM | Admit: 2022-08-18 | Discharge: 2022-08-18 | Discharge status: Against Medical Advice | Payer: Medicare Other | Attending: Emergency Medicine | Admitting: Emergency Medicine

## 2022-08-18 ENCOUNTER — Other Ambulatory Visit: Payer: Self-pay

## 2022-08-18 DIAGNOSIS — R319 Hematuria, unspecified: Secondary | ICD-10-CM | POA: Insufficient documentation

## 2022-08-18 DIAGNOSIS — Z5321 Procedure and treatment not carried out due to patient leaving prior to being seen by health care provider: Secondary | ICD-10-CM | POA: Diagnosis not present

## 2022-08-18 LAB — CBC WITH DIFFERENTIAL/PLATELET
Abs Immature Granulocytes: 0.01 10*3/uL (ref 0.00–0.07)
Basophils Absolute: 0 10*3/uL (ref 0.0–0.1)
Basophils Relative: 1 %
Eosinophils Absolute: 0.2 10*3/uL (ref 0.0–0.5)
Eosinophils Relative: 4 %
HCT: 43.7 % (ref 39.0–52.0)
Hemoglobin: 14.6 g/dL (ref 13.0–17.0)
Immature Granulocytes: 0 %
Lymphocytes Relative: 23 %
Lymphs Abs: 1.2 10*3/uL (ref 0.7–4.0)
MCH: 29.8 pg (ref 26.0–34.0)
MCHC: 33.4 g/dL (ref 30.0–36.0)
MCV: 89.2 fL (ref 80.0–100.0)
Monocytes Absolute: 0.6 10*3/uL (ref 0.1–1.0)
Monocytes Relative: 12 %
Neutro Abs: 3 10*3/uL (ref 1.7–7.7)
Neutrophils Relative %: 60 %
Platelets: 197 10*3/uL (ref 150–400)
RBC: 4.9 MIL/uL (ref 4.22–5.81)
RDW: 12.7 % (ref 11.5–15.5)
WBC: 5 10*3/uL (ref 4.0–10.5)
nRBC: 0 % (ref 0.0–0.2)

## 2022-08-18 LAB — URINALYSIS, ROUTINE W REFLEX MICROSCOPIC
Bacteria, UA: NONE SEEN
RBC / HPF: >50 RBC/hpf — ABNORMAL HIGH (ref 0–5)
Specific Gravity, Urine: 1.03 (ref 1.005–1.030)
Squamous Epithelial / HPF: NONE SEEN (ref 0–5)
WBC, UA: >50 WBC/hpf — ABNORMAL HIGH (ref 0–5)

## 2022-08-18 LAB — COMPREHENSIVE METABOLIC PANEL
ALT: 20 U/L (ref 0–44)
AST: 35 U/L (ref 15–41)
Albumin: 4.1 g/dL (ref 3.5–5.0)
Alkaline Phosphatase: 55 U/L (ref 38–126)
Anion gap: 6 (ref 5–15)
BUN: 23 mg/dL (ref 8–23)
CO2: 26 mmol/L (ref 22–32)
Calcium: 9.5 mg/dL (ref 8.9–10.3)
Chloride: 107 mmol/L (ref 98–111)
Creatinine, Ser: 1.39 mg/dL — ABNORMAL HIGH (ref 0.61–1.24)
GFR, Estimated: 52 mL/min — ABNORMAL LOW (ref >60)
Glucose, Bld: 108 mg/dL — ABNORMAL HIGH (ref 70–99)
Potassium: 4.1 mmol/L (ref 3.5–5.1)
Sodium: 139 mmol/L (ref 135–145)
Total Bilirubin: 1.1 mg/dL (ref 0.3–1.2)
Total Protein: 7 g/dL (ref 6.5–8.1)

## 2022-08-18 NOTE — ED Triage Notes (Signed)
Pt arrives with c/o hematuria that started this morning. Pt has a hx of the same. Pt does not take blood thinners.

## 2022-08-21 ENCOUNTER — Emergency Department
Admission: EM | Admit: 2022-08-21 | Discharge: 2022-08-21 | Disposition: A | Discharge status: Home / Self Care | Payer: Medicare Other | Attending: Emergency Medicine | Admitting: Emergency Medicine

## 2022-08-21 ENCOUNTER — Other Ambulatory Visit: Payer: Self-pay

## 2022-08-21 DIAGNOSIS — R310 Gross hematuria: Secondary | ICD-10-CM | POA: Diagnosis present

## 2022-08-21 DIAGNOSIS — T839XXA Unspecified complication of genitourinary prosthetic device, implant and graft, initial encounter: Secondary | ICD-10-CM

## 2022-08-21 DIAGNOSIS — I10 Essential (primary) hypertension: Secondary | ICD-10-CM | POA: Insufficient documentation

## 2022-08-21 DIAGNOSIS — J45909 Unspecified asthma, uncomplicated: Secondary | ICD-10-CM | POA: Insufficient documentation

## 2022-08-21 LAB — URINALYSIS, ROUTINE W REFLEX MICROSCOPIC: Specific Gravity, Urine: 1.016 (ref 1.005–1.030)

## 2022-08-21 NOTE — Discharge Instructions (Signed)
You may irrigate your catheter as instructed.  Return to the ER for worsening symptoms, persistent vomiting, feeling faint or other concerns.

## 2022-08-21 NOTE — ED Triage Notes (Signed)
Pt sts that he had a urinary catheter placed yesterday and it is not draining properly and he has urine coming out around it. Pt has frank blood noted in the urine. It is a 41fr 3-way catheter that is in place.

## 2022-08-21 NOTE — ED Notes (Signed)
RN educated pt on catheter irrigation by irrigating pt catheter.  Pt was able to repeat back process.

## 2022-08-21 NOTE — ED Provider Notes (Signed)
[facility] Provider Note    Event Date/Time   First MD Initiated Contact with Patient 08/21/22 [phone]     (approximate)   History   Follow-up and Hematuria   HPI  Wesley Weber is a 76 y.o. male who presents to the ED from home with a chief complaint of Foley catheter problem.  Patient has a history of BPH.  Had Foley catheter placed on 08/19/2022 at OSH for urinary retention with hematuria.  He returned later that evening for catheter leaking and had catheter replaced.  Presents tonight for clogged Foley catheter.  Denies anticoagulant use.  Denies fever, chills, cough, chest pain, shortness of breath, abdominal pain, nausea, vomiting, dysuria, lightheadedness or dizziness.  Has a appointment with his urologist scheduled for October 3     Past Medical History   Past Medical History:  Diagnosis Date   Asthma    followed by [facility] [place]   Benign localized prostatic hyperplasia with lower urinary tract symptoms (LUTS)    urologist-- [name]--  s/p urolift twice in 2019   Chronic constipation    Chronic dryness of both eyes    Elevated PSA    Foley catheter in place    GERD (gastroesophageal reflux disease)    Gross hematuria    History of prostatitis    Hyperlipidemia    Hypertension    followed by pcp  (11-01-2019 per pt had nuclear stress test done _0  approx. 2018, told is was normal)   IDA (iron deficiency anemia)    OSA on CPAP    uses nightly   Wears glasses    Wears hearing aid in both ears      Active Problem List   Patient Active Problem List   Diagnosis Date Noted   BPH (benign prostatic hyperplasia) 11/04/2019   Urinary retention 02/04/2018     Past Surgical History   Past Surgical History:  Procedure Laterality Date   ANKLE ARTHRODESIS Right 05/27/2008   COLONOSCOPY N/A 06/24/2020   Procedure: COLONOSCOPY;  Surgeon: Toledo, Benay Pike, MD;  Location: [facility] ENDOSCOPY;  Service: Gastroenterology;  Laterality: N/A;    CYSTOSCOPY WITH FULGERATION N/A 11/04/2019   Procedure: CYSTOSCOPY WITH TRANSURETHRAL RESECTION OF THE PROSTATE;  Surgeon: Cleon Gustin, MD;  Location: [facility];  Service: Urology;  Laterality: N/A;  [place]  05/14/2018  _1  [facility] (MAC anesthesia)   CYSTOSCOPY WITH INSERTION OF UROLIFT N/A 09/10/2018   Procedure: CYSTOSCOPY WITH INSERTION OF UROLIFT;  Surgeon: Cleon Gustin, MD;  Location: [facility];  Service: Urology;  Laterality: N/A;   KNEE ARTHROSCOPY W/ MENISCAL REPAIR Left 01-22-2015  _2      Home Medications   Prior to Admission medications   Medication Sig Start Date End Date Taking? Authorizing Provider  albuterol (PROVENTIL HFA;VENTOLIN HFA) 108 (90 Base) MCG/ACT inhaler Inhale 2 puffs into the lungs every 4 (four) hours as needed for wheezing or shortness of breath.    [provider]  ascorbic acid (VITAMIN C) 500 MG tablet Take 1 tablet by mouth daily. 08/27/20   [provider]  aspirin EC 81 MG tablet Take 81 mg by mouth daily.    [provider]  chlorpheniramine (CHLOR-TRIMETON) 4 MG tablet Take 4 mg by mouth daily as needed for allergies.    [provider]  Cholecalciferol (VITAMIN D) 2000 units CAPS Take 2,000 Units by mouth daily.     [provider]  ferrous sulfate 325 (65 FE) MG tablet Take 325 mg by mouth daily with breakfast.    [provider]  LIVALO 1 MG TABS Take 1 tablet by mouth daily. 04/08/22   [provider]  loratadine (CLARITIN) 10 MG tablet Take 1 tablet by mouth daily. 01/28/20 04/28/22  [provider]  mometasone (ASMANEX) 220 MCG/INH inhaler Inhale 2 puffs into the lungs at bedtime.     [provider]  montelukast (SINGULAIR) 10 MG tablet Take by mouth.    [provider]  omeprazole (PRILOSEC) 40 MG capsule Take 40 mg by mouth daily.    [provider]   oxybutynin (DITROPAN-XL) 10 MG 24 hr tablet  12/01/20   [provider]  rosuvastatin (CRESTOR) 20 MG tablet Take 20 mg by mouth at bedtime. 12/13/20   [provider]  valsartan (DIOVAN) 160 MG tablet Take 160 mg by mouth daily.     [provider]  vardenafil (LEVITRA) 20 MG tablet TAKE ONE TABLET BY MOUTH AS INSTRUCTED (TAKE 1 HOUR PRIOR TO SEXUAL ACTIVITY *DO NOT EXCEED 1 DOSE PER 24 HOUR PERIOD*) 08/25/21 08/26/22  [provider]  Vitamin D, Cholecalciferol, 10 MCG (400 UNIT) TABS  12/02/20   [provider]     Allergies  Ciprofloxacin, Dutasteride, Finasteride, Lisinopril, Loratadine, Meloxicam, Minoxidil, Terazosin, Hydralazine, and Omeprazole   Family History   Family History  Problem Relation Age of Onset   Diabetes Mother    Cancer Father      Physical Exam  Triage Vital Signs: ED Triage Vitals [08/21/22 0031]  Enc Vitals Group     BP (!) 137/55     Pulse Rate 75     Resp 16     Temp 98.2 F (36.8 C)     Temp Source Oral     SpO2 98 %     Weight 215 lb (97.5 kg)     Height _0  (1.854 m)     Head Circumference      Peak Flow      Pain Score 3     Pain Loc      Pain Edu?      Excl. in [place]?     Updated Vital Signs: BP (!) 140/69 (BP Location: Left Arm)   Pulse 72   Temp 98.2 F (36.8 C) (Oral)   Resp 17   Ht _1  (1.854 m)   Wt 97.5 kg   SpO2 98%   BMI 28.37 kg/m    General: Awake, no distress.  CV:  RRR.  Good peripheral perfusion.  Resp:  Normal effort.  CTA B. Abd:  Nontender to light or deep palpation.  No distention.  Other:  Foley catheter in place.  No leaking around insertion site.  Leg bag almost full with urine.   ED Results / Procedures / Treatments  Labs (all labs ordered are listed, but only abnormal results are displayed) Labs Reviewed  URINALYSIS, ROUTINE W REFLEX MICROSCOPIC - Abnormal; Notable for the following components:      Result Value   Color, Urine RED (*)    APPearance  CLOUDY (*)    Glucose, UA   (*)    Value: TEST NOT REPORTED DUE TO COLOR INTERFERENCE OF URINE PIGMENT   Hgb urine dipstick   (*)    Value: TEST NOT REPORTED DUE TO COLOR INTERFERENCE OF URINE PIGMENT   Bilirubin Urine   (*)    Value: TEST NOT REPORTED  DUE TO COLOR INTERFERENCE OF URINE PIGMENT   Ketones, ur   (*)    Value: TEST NOT REPORTED DUE TO COLOR INTERFERENCE OF URINE PIGMENT   Protein, ur   (*)    Value: TEST NOT REPORTED DUE TO COLOR INTERFERENCE OF URINE PIGMENT   Nitrite   (*)    Value: TEST NOT REPORTED DUE TO COLOR INTERFERENCE OF URINE PIGMENT   Leukocytes,Ua   (*)    Value: TEST NOT REPORTED DUE TO COLOR INTERFERENCE OF URINE PIGMENT   All other components within normal limits     EKG  None   RADIOLOGY None   Official radiology report(s): No results found.   PROCEDURES:  Critical Care performed: No  Procedures   MEDICATIONS ORDERED IN ED: Medications - No data to display   IMPRESSION / MDM / [place] / ED COURSE  I reviewed the triage vital signs and the nursing notes.                             76 year old male presenting with Foley catheter problem and hematuria.  Differential diagnosis includes but is not limited to clogged catheter due to tissue/hematuria, infection, metabolic disorder, etc.  I have personally reviewed patient's records and note his [place] visit on 08/19/2022 where he was referred to a [facility] for hematuria and urinary obstruction.  Noted [facility] ED visit from 08/19/2022 for urinary retention, noted normal H/H and CT scan.  Have also reviewed patient's return visit on 08/19/2022 for Foley catheter problem.  Patient's presentation is most consistent with acute, uncomplicated illness.  By the time of our interview and examination, patient's Foley catheter is working with leg bag almost full of bloody urine.  Patient notes he has not been instructed on how to irrigate his Foley at home.  Nurse will instruct  patient on Foley irrigation at home as well as irrigating Foley now.  Patient is eager for discharge but agrees to wait for instructions.  He will follow-up with his urologist on 10/3 as scheduled.  Strict return precautions given.  Patient verbalizes understanding and agrees with plan of care.      FINAL CLINICAL IMPRESSION(S) / ED DIAGNOSES   Final diagnoses:  Gross hematuria  Foley catheter problem, initial encounter ([place])     Rx / DC Orders   ED Discharge Orders     None        Note:  This document was prepared using Dragon voice recognition software and may include unintentional dictation errors.   Paulette Blanch, MD 08/21/22 [phone]

## 2022-08-22 LAB — URINE CULTURE: Culture: NO GROWTH

## 2022-08-23 ENCOUNTER — Other Ambulatory Visit: Payer: Self-pay

## 2022-08-23 ENCOUNTER — Emergency Department
Admission: EM | Admit: 2022-08-23 | Discharge: 2022-08-23 | Disposition: A | Discharge status: Home / Self Care | Payer: Medicare Other | Attending: Emergency Medicine | Admitting: Emergency Medicine

## 2022-08-23 DIAGNOSIS — Y69 Unspecified misadventure during surgical and medical care: Secondary | ICD-10-CM | POA: Insufficient documentation

## 2022-08-23 DIAGNOSIS — T83098A Other mechanical complication of other indwelling urethral catheter, initial encounter: Secondary | ICD-10-CM | POA: Insufficient documentation

## 2022-08-23 DIAGNOSIS — T839XXA Unspecified complication of genitourinary prosthetic device, implant and graft, initial encounter: Secondary | ICD-10-CM

## 2022-08-23 NOTE — ED Provider Notes (Signed)
[facility] Provider Note  Patient Contact: 10:19 PM (approximate)   History   Urinary Retention   HPI  Wesley Weber is a 76 y.o. male who presents the emergency department complaining of decreased urination from his Foley cath.  Patient states that he believes he may have a blockage in his Foley cath.  Patient states that if he pushes really hard or has a bladder spasm it will for some urine around the Foley cath and a small amount down the tube.  He states that other than this he has not had urinary output through his Foley cath.  He does not feel like he is retaining like he did before his Foley catheter but is concerned that if he does not manage this it could lead to further retention.  Patient has a problem with his prostate with BPH and states that he has elected to have this surgically excised later this week.  He is not on Flomax or similar medication.     Physical Exam   Triage Vital Signs: ED Triage Vitals  Enc Vitals Group     BP 08/23/22 2134 (!) 174/83     Pulse Rate 08/23/22 2134 69     Resp 08/23/22 2134 15     Temp 08/23/22 2134 99.3 F (37.4 C)     Temp Source 08/23/22 2134 Oral     SpO2 08/23/22 2134 97 %     Weight 08/23/22 2135 215 lb (97.5 kg)     Height 08/23/22 2135 6\' 1"  (1.854 m)     Head Circumference --      Peak Flow --      Pain Score 08/23/22 2135 7     Pain Loc --      Pain Edu? --      Excl. in [place]? --     Most recent vital signs: Vitals:   08/23/22 2134  BP: (!) 174/83  Pulse: 69  Resp: 15  Temp: 99.3 F (37.4 C)  SpO2: 97%     General: Alert and in no acute distress.  Cardiovascular:  Good peripheral perfusion Respiratory: Normal respiratory effort without tachypnea or retractions. Lungs CTAB.  Gastrointestinal: Bowel sounds 4 quadrants. Soft and nontender to palpation. No guarding or rigidity. No palpable masses. No distention. No CVA tenderness.  Foley cath in place.  There is urine in the  patient's Foley bag.  No leakage around the Foley cath identified at this time. Musculoskeletal: Full range of motion to all extremities.  Neurologic:  No gross focal neurologic deficits are appreciated.  Skin:   No rash noted Other:   ED Results / Procedures / Treatments   Labs (all labs ordered are listed, but only abnormal results are displayed) Labs Reviewed  URINALYSIS, ROUTINE W REFLEX MICROSCOPIC     EKG     RADIOLOGY    No results found.  PROCEDURES:  Critical Care performed: No  Procedures   MEDICATIONS ORDERED IN ED: Medications - No data to display   IMPRESSION / MDM / [place] / ED COURSE  I reviewed the triage vital signs and the nursing notes.                              Differential diagnosis includes, but is not limited to, urinary retention, Foley cath problem, clogged Foley cath, UTI  Patient's presentation is most consistent with acute presentation with potential threat to life  or bodily function.   Patient's diagnosis is consistent with Foley cath complication.  Patient presented to the emergency department with a potential clogged Foley cath.  Patient states that he has a Foley cath due to BPH and is scheduled to have his prostate resected.  Patient states that he can only urinate if he strains really hard or has a bladder spasm.  He does have some leakage around the Foley cath.  There was urine in the Foley cath bag, 100 mL on bladder scan.  We attempted to flush, will replace Foley cath with a coud.  Follow-up with urology as he already has an appointment scheduled in 2 days.  Return precautions discussed with the patient.. Patient is given ED precautions to return to the ED for any worsening or new symptoms.        FINAL CLINICAL IMPRESSION(S) / ED DIAGNOSES   Final diagnoses:  Problem with Foley catheter, initial encounter [facility])     Rx / DC Orders   ED Discharge Orders     None        Note:  This document was  prepared using Dragon voice recognition software and may include unintentional dictation errors.   Racheal Patches, PA-C 08/23/22 2324    Merwyn Katos, MD 08/23/22 2330

## 2022-08-23 NOTE — ED Triage Notes (Addendum)
Pt via POV c/o blockage of indwelling foley catheter since around 5pm this evening. Foley in place since last Friday due to BPH, no prostate procedures to date. Pt states if he has a bladder spasm, the drainage bag will fill up but then urine will not drain anymore until another spasm occurs which is not normal per the past several days. Pt reports low back pain 7/10. Pt believes his current catheter is 20 fr with a "special tip." Coude cath not noted in LDAs in chart.

## 2022-08-24 ENCOUNTER — Emergency Department
Admission: EM | Admit: 2022-08-24 | Discharge: 2022-08-24 | Disposition: A | Discharge status: Home / Self Care | Payer: Medicare Other | Attending: Student in an Organized Health Care Education/Training Program | Admitting: Student in an Organized Health Care Education/Training Program

## 2022-08-24 ENCOUNTER — Other Ambulatory Visit: Payer: Self-pay

## 2022-08-24 ENCOUNTER — Encounter: Payer: Self-pay | Admitting: *Deleted

## 2022-08-24 DIAGNOSIS — R339 Retention of urine, unspecified: Secondary | ICD-10-CM | POA: Diagnosis not present

## 2022-08-24 DIAGNOSIS — T83098A Other mechanical complication of other indwelling urethral catheter, initial encounter: Secondary | ICD-10-CM | POA: Diagnosis present

## 2022-08-24 DIAGNOSIS — T839XXA Unspecified complication of genitourinary prosthetic device, implant and graft, initial encounter: Secondary | ICD-10-CM

## 2022-08-24 LAB — URINALYSIS, ROUTINE W REFLEX MICROSCOPIC
Bilirubin Urine: NEGATIVE
Glucose, UA: NEGATIVE mg/dL
Ketones, ur: NEGATIVE mg/dL
Nitrite: NEGATIVE
Protein, ur: 30 mg/dL — AB
RBC / HPF: >50 RBC/hpf — ABNORMAL HIGH (ref 0–5)
Specific Gravity, Urine: 1.012 (ref 1.005–1.030)
Squamous Epithelial / HPF: NONE SEEN (ref 0–5)
WBC, UA: >50 WBC/hpf — ABNORMAL HIGH (ref 0–5)
pH: 6 (ref 5.0–8.0)

## 2022-08-24 NOTE — ED Notes (Addendum)
Catheter was irrigated after bladder scanner showed a 11ml amount. This RN was able to flush and pull liquid from the catheter. Provider notified.

## 2022-08-24 NOTE — ED Provider Notes (Signed)
   [facility] Provider Note  Patient Contact: 10:59 PM (approximate)   History   Urinary Retention   HPI  Wesley Weber is a 76 y.o. male presents to the emergency department with concern for a clogged Foley catheter.  Patient denies pain, fever, chills or generalized malaise at home.      Physical Exam   Triage Vital Signs: ED Triage Vitals  Enc Vitals Group     BP 08/24/22 2133 (!) 160/89     Pulse Rate 08/24/22 2133 73     Resp 08/24/22 2133 20     Temp 08/24/22 2133 97.9 F (36.6 C)     Temp Source 08/24/22 2133 Oral     SpO2 08/24/22 2133 95 %     Weight 08/24/22 2134 215 lb (97.5 kg)     Height 08/24/22 2134 6\' 1"  (1.854 m)     Head Circumference --      Peak Flow --      Pain Score 08/24/22 2133 8     Pain Loc --      Pain Edu? --      Excl. in [place]? --     Most recent vital signs: Vitals:   08/24/22 2133  BP: (!) 160/89  Pulse: 73  Resp: 20  Temp: 97.9 F (36.6 C)  SpO2: 95%     General: Alert and in no acute distress. Eyes:  PERRL. EOMI. Head: No acute traumatic findings ENT:      Nose: No congestion/rhinnorhea.      Mouth/Throat: Mucous membranes are moist.  Neck: No stridor. No cervical spine tenderness to palpation. Cardiovascular:  Good peripheral perfusion Respiratory: Normal respiratory effort without tachypnea or retractions. Lungs CTAB. Good air entry to the bases with no decreased or absent breath sounds. Gastrointestinal: Bowel sounds 4 quadrants. Soft and nontender to palpation. No guarding or rigidity. No palpable masses. No distention. No CVA tenderness. Musculoskeletal: Full range of motion to all extremities.  Neurologic:  No gross focal neurologic deficits are appreciated.  Skin:   No rash noted    ED Results / Procedures / Treatments   Labs (all labs ordered are listed, but only abnormal results are displayed) Labs Reviewed - No data to display     PROCEDURES:  Critical Care performed:  No  Procedures   MEDICATIONS ORDERED IN ED: Medications - No data to display   IMPRESSION / MDM / [place] / ED COURSE  I reviewed the triage vital signs and the nursing notes.                              Assessment and plan Problem with Foley catheter 76 year old male presents to the emergency department with a possible Foley catheter issue.  Foley catheter was irrigated in the emergency department and patient had good urine output.  Patient was advised to follow-up with his urologist as needed.  All patient questions were answered.     FINAL CLINICAL IMPRESSION(S) / ED DIAGNOSES   Final diagnoses:  Problem with Foley catheter, initial encounter [facility])     Rx / DC Orders   ED Discharge Orders     None        Note:  This document was prepared using Dragon voice recognition software and may include unintentional dictation errors.   Vallarie Mare [place], PA-C 08/24/22 2301    Merlyn Lot, MD 08/25/22 [phone]

## 2022-08-24 NOTE — ED Triage Notes (Signed)
Pt has a foley catheter and states it is clogged up.  Pt had catheter placed yesterday here in the ER.  Pt alert  speech clear.

## 2022-11-21 DIAGNOSIS — E871 Hypo-osmolality and hyponatremia: Secondary | ICD-10-CM | POA: Insufficient documentation

## 2022-11-21 DIAGNOSIS — N1831 Chronic kidney disease, stage 3a: Secondary | ICD-10-CM | POA: Insufficient documentation

## 2023-04-11 NOTE — Progress Notes (Unsigned)
Cardiology Office Note Date:  04/12/2023  Patient ID:  Wesley Weber, Wesley Weber 1946/03/10, MRN [id number] PCP:  Ellyn Hack, MD  Cardiologist:  None (fornerly Dr. 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 male with PMH notable for PVCs, palpitations, HTN; seen today for Sherryl Manges, MD for routine electrophysiology followup.  He last saw Dr. Graciela Husbands 04/2022 to establish care. He had diagnosis of frequent PVC, but was asymptomatic with no LV dysfunction, so no treatment indicated. No PVCs on EKG at the visit.   Today, he tells me that he feels great. Works out at the [employer] 3-4 times/week.  Checks BP a couple times a week. Has measurements at home, PCP at [facility] manages HTN medications    he denies chest pain, palpitations, dyspnea, PND, orthopnea, nausea, vomiting, dizziness, syncope, edema, weight gain, or early satiety.     Past Medical History:  Diagnosis Date   Asthma    followed by [facility]   Benign localized prostatic hyperplasia with lower urinary tract symptoms (LUTS)    urologist-- dr Ronne Binning--  s/p urolift twice in 2019   Chronic constipation    Chronic dryness of both eyes    Elevated PSA    Foley catheter in place    GERD (gastroesophageal reflux disease)    Gross hematuria    History of prostatitis    Hyperlipidemia    Hypertension    followed by pcp  (11-01-2019 per pt had nuclear stress test done @[facility]  approx. 2018, told is was normal)   IDA (iron deficiency anemia)    OSA on CPAP    uses nightly   Wears glasses    Wears hearing aid in both ears     Past Surgical History:  Procedure Laterality Date   ANKLE ARTHRODESIS Right 05/27/2008   COLONOSCOPY N/A 06/24/2020   Procedure: COLONOSCOPY;  Surgeon: Toledo, Boykin Nearing, MD;  Location: [facility] ENDOSCOPY;  Service: Gastroenterology;  Laterality: N/A;   CYSTOSCOPY WITH FULGERATION N/A 11/04/2019    Procedure: CYSTOSCOPY WITH TRANSURETHRAL RESECTION OF THE PROSTATE;  Surgeon: Malen Gauze, MD;  Location: [facility];  Service: Urology;  Laterality: N/A;  30 MINS   CYSTOSCOPY WITH INSERTION OF UROLIFT  05/14/2018  @dr  mckenzie office (MAC anesthesia)   CYSTOSCOPY WITH INSERTION OF UROLIFT N/A 09/10/2018   Procedure: CYSTOSCOPY WITH INSERTION OF UROLIFT;  Surgeon: Malen Gauze, MD;  Location: [facility];  Service: Urology;  Laterality: N/A;   KNEE ARTHROSCOPY W/ MENISCAL REPAIR Left 01-22-2015  @[facility]    PROSTATE SURGERY     11/2022    Current Outpatient Medications  Medication Instructions   albuterol (PROVENTIL HFA;VENTOLIN HFA) 108 (90 Base) MCG/ACT inhaler 2 puffs, Inhalation, Every 4 hours PRN   amLODipine (NORVASC) 5 mg, Oral, Daily   aspirin EC 81 mg, Oral, Daily   azelastine (ASTELIN) 0.1 % nasal spray 1 spray, Each Nare, 2 times daily, Use in each nostril as directed   chlorpheniramine (CHLOR-TRIMETON) 4 mg, Oral, Daily PRN   famotidine (PEPCID) 20 mg, Oral, Daily at bedtime   ferrous sulfate 325 mg, Oral, Daily with breakfast   fluticasone (FLONASE) 50 MCG/ACT nasal spray 1 spray, Each Nare, 2 times daily   LIVALO 1 MG TABS 1 tablet, Oral, Daily   loratadine (CLARITIN) 10 MG tablet 1 tablet, Oral, Daily PRN   mometasone (ASMANEX) 220 MCG/INH  inhaler 2 puffs, Inhalation, Daily at bedtime   omeprazole (PRILOSEC) 40 mg, Oral, Daily   sildenafil (VIAGRA) 100 mg, Oral, Daily PRN   valsartan (DIOVAN) 160 mg, Oral, Daily   Vitamin D 2,000 Units, Oral, Daily    Social History:  The patient  reports that he quit smoking about 36 years ago. His smoking use included cigarettes. He has never used smokeless tobacco. He reports current alcohol use. He reports that he does not use drugs.   Family History:  The patient's family history includes Cancer in his father; Diabetes in his mother.  ROS:  Please see the history of present illness. All  other systems are reviewed and otherwise negative.   PHYSICAL EXAM:  VS:  BP 126/70 (BP Location: Left Arm, Patient Position: Sitting, Cuff Size: Normal)   Pulse (!) 58   Ht 6\' 1"  (1.854 m)   Wt 218 lb (98.9 kg)   SpO2 97%   BMI 28.76 kg/m  BMI: Body mass index is 28.76 kg/m.  GEN- The patient is well appearing, alert and oriented x 3 today.   Lungs- Clear to ausculation bilaterally, normal work of breathing.  Heart- Regular rate and rhythm, no murmurs, rubs or gallops Extremities- No peripheral edema, warm, dry   EKG is ordered. Personal review of EKG from today shows: SB, rate 58bpm, LAD  Recent Labs: 08/18/2022: ALT 20; BUN 23; Creatinine, Ser 1.39; Hemoglobin 14.6; Platelets 197; Potassium 4.1; Sodium 139  No results found for requested labs within last 365 days.   CrCl cannot be calculated (Patient's most recent lab result is older than the maximum 21 days allowed.).   Wt Readings from Last 3 Encounters:  04/12/23 218 lb (98.9 kg)  08/24/22 215 lb (97.5 kg)  08/23/22 215 lb (97.5 kg)     Additional studies reviewed include: Previous EP, cardiology notes.   TTE, 03/29/2022  1. Left ventricular ejection fraction, by estimation, is 55 to 60%. The left ventricle has normal function. The left ventricle has no regional wall motion abnormalities. Left ventricular diastolic parameters are indeterminate.   2. Right ventricular systolic function is normal. The right ventricular size is mildly enlarged. There is normal pulmonary artery systolic pressure. The estimated right ventricular systolic pressure is 33.0 mmHg.   3. Left atrial size was mildly dilated.   4. Right atrial size was mildly dilated.   5. The mitral valve is normal in structure. Mild mitral valve regurgitation. No evidence of mitral stenosis.   6. The aortic valve is tricuspid. Aortic valve regurgitation is mild. No aortic stenosis is present.   7. Aortic dilatation noted. There is dilatation of the ascending aorta,  measuring 43 mm.   8. The inferior vena cava is normal in size with greater than 50% respiratory variability, suggesting right atrial pressure of 3 mmHg.    ASSESSMENT AND PLAN:  #) abnormal EKG #) palpitations Patient has no complaints of palpitations today Feels well with great exercise capacity No PVC on today's EKG   #) HTN Normotensive in office today PCP managing   Current medicines are reviewed at length with the patient today.   The patient does not have concerns regarding his medicines.  The following changes were made today:  none  Labs/ tests ordered today include:  Orders Placed This Encounter  Procedures   EKG 12-Lead     Disposition: Follow up with Dr. Graciela Husbands in  PRN    Signed, Sherie Don, NP  04/12/23  1:47 PM  Electrophysiology [facility]

## 2023-04-12 ENCOUNTER — Encounter: Payer: Self-pay | Admitting: Cardiology

## 2023-04-12 ENCOUNTER — Ambulatory Visit: Payer: TRICARE For Life (TFL) | Attending: Cardiology | Admitting: Cardiology

## 2023-04-12 VITALS — BP 126/70 | HR 58 | Ht 73.0 in | Wt 218.0 lb

## 2023-04-12 DIAGNOSIS — R9431 Abnormal electrocardiogram [ECG] [EKG]: Secondary | ICD-10-CM | POA: Diagnosis not present

## 2023-04-12 DIAGNOSIS — I10 Essential (primary) hypertension: Secondary | ICD-10-CM | POA: Diagnosis not present

## 2023-04-12 NOTE — Patient Instructions (Signed)
Medication Instructions:  Your physician recommends that you continue on your current medications as directed. Please refer to the Current Medication list given to you today.  *If you need a refill on your cardiac medications before your next appointment, please call your pharmacy*  Lab Work: No labs ordered  If you have labs (blood work) drawn today and your tests are completely normal, you will receive your results only by: MyChart Message (if you have MyChart) OR A paper copy in the mail If you have any lab test that is abnormal or we need to change your treatment, we will call you to review the results.  Testing/Procedures: No testing ordered  Follow-Up: At [facility], you and your health needs are our priority.  As part of our continuing mission to provide you with exceptional heart care, we have created designated Provider Care Teams.  These Care Teams include your primary Cardiologist (physician) and Advanced Practice Providers (APPs -  Physician Assistants and Nurse Practitioners) who all work together to provide you with the care you need, when you need it.  We recommend signing up for the patient portal called "MyChart".  Sign up information is provided on this After Visit Summary.  MyChart is used to connect with patients for Virtual Visits (Telemedicine).  Patients are able to view lab/test results, encounter notes, upcoming appointments, etc.  Non-urgent messages can be sent to your provider as well.   To learn more about what you can do with MyChart, go to [email or web address].    Your next appointment:   As needed  Provider:   Steven Klein, MD or Suzann Riddle, NP  

## 2023-08-09 ENCOUNTER — Other Ambulatory Visit: Payer: Self-pay | Admitting: Family Medicine

## 2023-08-09 ENCOUNTER — Ambulatory Visit
Admission: RE | Admit: 2023-08-09 | Discharge: 2023-08-09 | Disposition: A | Discharge status: Home / Self Care | Payer: Medicare PPO | Source: Ambulatory Visit | Attending: Family Medicine | Admitting: Family Medicine

## 2023-08-09 ENCOUNTER — Encounter: Payer: Self-pay | Admitting: Family Medicine

## 2023-08-09 ENCOUNTER — Ambulatory Visit
Admission: RE | Admit: 2023-08-09 | Discharge: 2023-08-09 | Disposition: A | Discharge status: Home / Self Care | Payer: Medicare PPO | Attending: Family Medicine | Admitting: Family Medicine

## 2023-08-09 DIAGNOSIS — G8929 Other chronic pain: Secondary | ICD-10-CM

## 2023-08-09 DIAGNOSIS — M545 Low back pain, unspecified: Secondary | ICD-10-CM | POA: Insufficient documentation

## 2023-09-16 ENCOUNTER — Emergency Department
Admission: EM | Admit: 2023-09-16 | Discharge: 2023-09-16 | Disposition: A | Discharge status: Home / Self Care | Payer: Medicare PPO | Attending: Emergency Medicine | Admitting: Emergency Medicine

## 2023-09-16 ENCOUNTER — Emergency Department: Payer: TRICARE For Life (TFL)

## 2023-09-16 ENCOUNTER — Other Ambulatory Visit: Payer: Self-pay

## 2023-09-16 ENCOUNTER — Encounter: Payer: Self-pay | Admitting: Emergency Medicine

## 2023-09-16 DIAGNOSIS — R079 Chest pain, unspecified: Secondary | ICD-10-CM | POA: Diagnosis present

## 2023-09-16 DIAGNOSIS — J45909 Unspecified asthma, uncomplicated: Secondary | ICD-10-CM | POA: Insufficient documentation

## 2023-09-16 DIAGNOSIS — R072 Precordial pain: Secondary | ICD-10-CM | POA: Insufficient documentation

## 2023-09-16 DIAGNOSIS — R0602 Shortness of breath: Secondary | ICD-10-CM | POA: Diagnosis not present

## 2023-09-16 LAB — CBC
HCT: 39.4 % (ref 39.0–52.0)
Hemoglobin: 13.3 g/dL (ref 13.0–17.0)
MCH: 30.3 pg (ref 26.0–34.0)
MCHC: 33.8 g/dL (ref 30.0–36.0)
MCV: 89.7 fL (ref 80.0–100.0)
Platelets: 186 10*3/uL (ref 150–400)
RBC: 4.39 MIL/uL (ref 4.22–5.81)
RDW: 12.9 % (ref 11.5–15.5)
WBC: 4 10*3/uL (ref 4.0–10.5)
nRBC: 0 % (ref 0.0–0.2)

## 2023-09-16 LAB — BASIC METABOLIC PANEL
Anion gap: 8 (ref 5–15)
BUN: 22 mg/dL (ref 8–23)
CO2: 25 mmol/L (ref 22–32)
Calcium: 9.6 mg/dL (ref 8.9–10.3)
Chloride: 108 mmol/L (ref 98–111)
Creatinine, Ser: 1.43 mg/dL — ABNORMAL HIGH (ref 0.61–1.24)
GFR, Estimated: 50 mL/min — ABNORMAL LOW (ref >60)
Glucose, Bld: 100 mg/dL — ABNORMAL HIGH (ref 70–99)
Potassium: 3.9 mmol/L (ref 3.5–5.1)
Sodium: 141 mmol/L (ref 135–145)

## 2023-09-16 LAB — TROPONIN I (HIGH SENSITIVITY)
Troponin I (High Sensitivity): 16 ng/L (ref <18)
Troponin I (High Sensitivity): 15 ng/L (ref <18)

## 2023-09-16 NOTE — ED Triage Notes (Signed)
Pt via POV from home. Pt c/o R sided chest pressure that started 2 hours ago that now has traveled to mid-chest. Report SOB and hx of asthma, report taking rescue breaths with some relief. Denies hx of MI but reports hx of asthma and HTN. Pt is A&OX4 and NAD, ambulatory to triage.

## 2023-09-16 NOTE — ED Notes (Signed)
EDP at [facility] 

## 2023-09-16 NOTE — ED Provider Notes (Signed)
[facility] Provider Note   Event Date/Time   First MD Initiated Contact with Patient 09/16/23 0813     (approximate) History  Chest Pain and Shortness of Breath  HPI Wesley Weber is a 77 y.o. male with stated past medical history of asthma who presents complaining of substernal chest pain that began approximately 2 hours prior to arrival and woke him from sleep.  Patient states that he also has a history of sleep apnea wearing CPAP but is concerned as he is beginning to have a light cough as well.  Patient also endorses chest congestion with no productive cough.  Patient denies any recent fevers, recent travel, or sick contacts ROS: Patient currently denies any vision changes, tinnitus, difficulty speaking, facial droop, sore throat, chest pain, shortness of breath, abdominal pain, nausea/vomiting/diarrhea, dysuria, or weakness/numbness/paresthesias in any extremity   Physical Exam  Triage Vital Signs: ED Triage Vitals  Encounter Vitals Group     BP 09/16/23 0758 (!) 144/78     Systolic BP Percentile --      Diastolic BP Percentile --      Pulse Rate 09/16/23 0758 64     Resp 09/16/23 0758 20     Temp 09/16/23 0758 97.9 F (36.6 C)     Temp Source 09/16/23 0758 Oral     SpO2 09/16/23 0758 100 %     Weight 09/16/23 0757 218 lb (98.9 kg)     Height 09/16/23 0757 6\' 1"  (1.854 m)     Head Circumference --      Peak Flow --      Pain Score 09/16/23 0757 6     Pain Loc --      Pain Education --      Exclude from Growth Chart --    Most recent vital signs: Vitals:   09/16/23 1000 09/16/23 1030  BP: 117/86 123/67  Pulse: (!) 57 60  Resp: 14 16  Temp:    SpO2: 100% 99%   General: Awake, oriented x4. CV:  Good peripheral perfusion.  Resp:  Normal effort.  Abd:  No distention.  Other:  Elderly overweight Caucasian male resting comfortably in no acute distress ED Results / Procedures / Treatments  Labs (all labs ordered are listed, but only  abnormal results are displayed) Labs Reviewed  BASIC METABOLIC PANEL - Abnormal; Notable for the following components:      Result Value   Glucose, Bld 100 (*)    Creatinine, Ser 1.43 (*)    GFR, Estimated 50 (*)    All other components within normal limits  CBC  TROPONIN I (HIGH SENSITIVITY)  TROPONIN I (HIGH SENSITIVITY)   EKG ED ECG REPORT I, Merwyn Katos, the attending physician, personally viewed and interpreted this ECG. Date: 09/16/2023 EKG Time: 0754 Rate: 68 Rhythm: normal sinus rhythm QRS Axis: normal Intervals: normal ST/T Wave abnormalities: normal Narrative Interpretation: no evidence of acute ischemia RADIOLOGY ED MD interpretation: 2 view chest x-ray interpreted by me shows no evidence of acute abnormalities including no pneumonia, pneumothorax, or widened mediastinum -Agree with radiology assessment Official radiology report(s): DG Chest 2 View  Result Date: 09/16/2023 CLINICAL DATA:  77 year old male with right side chest pain acute onset 2 hours ago. EXAM: CHEST - 2 VIEW COMPARISON:  Chest radiographs 12/2018 and earlier. FINDINGS: Lung volumes and mediastinal contours are stable and within normal limits. Visualized tracheal air column is within normal limits. Lungs appear stable and clear. No pneumothorax, pulmonary edema, pleural  effusion or acute pulmonary opacity. No acute osseous abnormality identified. Negative visible bowel gas. IMPRESSION: No acute cardiopulmonary abnormality. Electronically Signed   By: Odessa Fleming M.D.   On: 09/16/2023 09:01   PROCEDURES: Critical Care performed: No .1-3 Lead EKG Interpretation  Performed by: Merwyn Katos, MD Authorized by: Merwyn Katos, MD     Interpretation: normal     ECG rate:  71   ECG rate assessment: normal     Rhythm: sinus rhythm     Ectopy: none     Conduction: normal    MEDICATIONS ORDERED IN ED: Medications - No data to display IMPRESSION / MDM / ASSESSMENT AND PLAN / ED COURSE  I reviewed the  triage vital signs and the nursing notes.                             The patient is on the cardiac monitor to evaluate for evidence of arrhythmia and/or significant heart rate changes. Patient's presentation is most consistent with acute presentation with potential threat to life or bodily function. Workup: ECG, CXR, CBC, BMP, Troponin Findings: ECG: No overt evidence of STEMI. No evidence of Brugada's sign, delta wave, epsilon wave, significantly prolonged QTc, or malignant arrhythmia HS Troponin: Negative x1 Other Labs unremarkable for emergent problems. CXR: Without PTX, PNA, or widened mediastinum HEART Score: 4  Given History, Exam, and Workup I have low suspicion for ACS, Pneumothorax, Pneumonia, Pulmonary Embolus, Tamponade, Aortic Dissection or other emergent problem as a cause for this presentation.   Reassesment: Prior to discharge patient's pain was controlled and they were well appearing.  Disposition:  Discharge. Strict return precautions discussed with patient with full understanding. Advised patient to follow up promptly with primary care provider    FINAL CLINICAL IMPRESSION(S) / ED DIAGNOSES   Final diagnoses:  Chest pain, unspecified type   Rx / DC Orders   ED Discharge Orders     None      Note:  This document was prepared using Dragon voice recognition software and may include unintentional dictation errors.   Merwyn Katos, MD 09/16/23 [phone]

## 2024-04-03 ENCOUNTER — Encounter: Admitting: *Deleted

## 2024-04-03 DIAGNOSIS — K219 Gastro-esophageal reflux disease without esophagitis: Secondary | ICD-10-CM | POA: Insufficient documentation

## 2024-04-03 DIAGNOSIS — F4310 Post-traumatic stress disorder, unspecified: Secondary | ICD-10-CM | POA: Insufficient documentation

## 2024-04-03 DIAGNOSIS — J441 Chronic obstructive pulmonary disease with (acute) exacerbation: Secondary | ICD-10-CM | POA: Insufficient documentation

## 2024-04-03 DIAGNOSIS — E785 Hyperlipidemia, unspecified: Secondary | ICD-10-CM | POA: Insufficient documentation

## 2024-04-03 DIAGNOSIS — J449 Chronic obstructive pulmonary disease, unspecified: Secondary | ICD-10-CM

## 2024-04-03 DIAGNOSIS — M170 Bilateral primary osteoarthritis of knee: Secondary | ICD-10-CM | POA: Insufficient documentation

## 2024-04-03 NOTE — Progress Notes (Signed)
 Initial phone call completed. Diagnosis can be found in [facility] 5/7. EP Orientation scheduled for Monday 5/19 at 9am.

## 2024-04-08 ENCOUNTER — Encounter

## 2024-04-08 VITALS — Ht 72.75 in | Wt 217.2 lb

## 2024-04-08 DIAGNOSIS — J441 Chronic obstructive pulmonary disease with (acute) exacerbation: Secondary | ICD-10-CM | POA: Diagnosis present

## 2024-04-08 DIAGNOSIS — J449 Chronic obstructive pulmonary disease, unspecified: Secondary | ICD-10-CM

## 2024-04-08 NOTE — Progress Notes (Signed)
 Cardiac Individual Treatment Plan  Patient Details  Name: Wesley Weber MRN: [id number] Date of Birth: October 18, 1946 Referring Provider:   Gattis Kass Pulmonary Rehab from 04/08/2024 in [facility] Cardiac and Pulmonary Rehab  Referring Provider Dr. Artie Laster, MD       Initial Encounter Date:  Flowsheet Row Pulmonary Rehab from 04/08/2024 in [facility] Cardiac and Pulmonary Rehab  Date 04/08/24       Visit Diagnosis: Chronic obstructive pulmonary disease, unspecified COPD type (HCC)  Patient's Home Medications on Admission:  Current Outpatient Medications:    albuterol  (PROVENTIL  HFA;VENTOLIN  HFA) 108 (90 Base) MCG/ACT inhaler, Inhale 2 puffs into the lungs every 4 (four) hours as needed for wheezing or shortness of breath., Disp: , Rfl:    amLODipine  (NORVASC ) 5 MG tablet, Take 5 mg by mouth daily., Disp: , Rfl:    aspirin  EC 81 MG tablet, Take 81 mg by mouth daily. (Patient not taking: Reported on 04/03/2024), Disp: , Rfl:    azelastine (ASTELIN) 0.1 % nasal spray, Place 1 spray into both nostrils 2 (two) times daily. Use in each nostril as directed (Patient not taking: Reported on 04/03/2024), Disp: , Rfl:    budesonide (RHINOCORT AQUA) 32 MCG/ACT nasal spray, Place 1 spray into both nostrils 2 (two) times daily., Disp: , Rfl:    chlorpheniramine (CHLOR-TRIMETON) 4 MG tablet, Take 4 mg by mouth daily as needed for allergies. (Patient not taking: Reported on 04/03/2024), Disp: , Rfl:    Cholecalciferol  (VITAMIN D ) 2000 units CAPS, Take 2,000 Units by mouth daily. , Disp: , Rfl:    diclofenac Sodium (VOLTAREN) 1 % GEL, Apply 4 g topically 3 (three) times daily., Disp: , Rfl:    famotidine (PEPCID) 40 MG tablet, Take 40 mg by mouth 2 (two) times daily., Disp: , Rfl:    ferrous sulfate  325 (65 FE) MG tablet, Take 325 mg by mouth daily with breakfast., Disp: , Rfl:    fluticasone  (FLONASE ) 50 MCG/ACT nasal spray, Place 1 spray into both nostrils 2 (two) times daily. (Patient not taking: Reported on  04/03/2024), Disp: , Rfl:    ipratropium (ATROVENT) 0.06 % nasal spray, Place 2 sprays into both nostrils 2 (two) times daily., Disp: , Rfl:    ketotifen (ZADITOR) 0.035 % ophthalmic solution, Place 1 drop into both eyes 2 (two) times daily., Disp: , Rfl:    LIVALO 1 MG TABS, Take 1 tablet by mouth daily. (Patient not taking: Reported on 04/03/2024), Disp: , Rfl:    loratadine (CLARITIN) 10 MG tablet, Take 1 tablet by mouth daily as needed. (Patient not taking: Reported on 04/03/2024), Disp: , Rfl:    mometasone (ASMANEX) 220 MCG/INH inhaler, Inhale 2 puffs into the lungs at bedtime. , Disp: , Rfl:    omeprazole (PRILOSEC) 40 MG capsule, Take 40 mg by mouth daily. (Patient not taking: Reported on 04/03/2024), Disp: , Rfl:    sildenafil (VIAGRA) 100 MG tablet, Take 100 mg by mouth daily as needed for erectile dysfunction., Disp: , Rfl:    valsartan (DIOVAN) 160 MG tablet, Take 160 mg by mouth daily. , Disp: , Rfl:   Past Medical History: Past Medical History:  Diagnosis Date   Asthma    followed by [facility]   Benign localized prostatic hyperplasia with lower urinary tract symptoms (LUTS)    urologist-- dr Claretta Croft--  s/p urolift twice in 2019   Chronic constipation    Chronic dryness of both eyes    Elevated PSA    Foley catheter in  place    GERD (gastroesophageal reflux disease)    Gross hematuria    History of prostatitis    Hyperlipidemia    Hypertension    followed by pcp  (11-01-2019 per pt had nuclear stress test done @[facility]  approx. 2018, told is was normal)   IDA (iron deficiency anemia)    OSA on CPAP    uses nightly   Wears glasses    Wears hearing aid in both ears     Tobacco Use: Social History   Tobacco Use  Smoking Status Former   Current packs/day: 0.00   Types: Cigarettes   Start date: 09/06/1976   Quit date: 09/06/1986   Years since quitting: 37.6  Smokeless Tobacco Never    Labs: Review Flowsheet       Latest Ref Rng & Units 11/04/2019  Labs for  [facility] Cardiac and Pulmonary Rehab  TCO2 22 - 32 mmol/L 24      Exercise Target Goals: Exercise Program Goal: Individual exercise prescription set using results from initial 6 min walk test and THRR while considering  patient's activity barriers and safety.   Exercise Prescription Goal: Initial exercise prescription builds to 30-45 minutes a day of aerobic activity, 2-3 days per week.  Home exercise guidelines will be given to patient during program as part of exercise prescription that the participant will acknowledge.   Education: Aerobic Exercise: - Group verbal and visual presentation on the components of exercise prescription. Introduces F.I.T.T principle from ACSM for exercise prescriptions.  Reviews F.I.T.T. principles of aerobic exercise including progression. Written material given at graduation.   Education: Resistance Exercise: - Group verbal and visual presentation on the components of exercise prescription. Introduces F.I.T.T principle from ACSM for exercise prescriptions  Reviews F.I.T.T. principles of resistance exercise including progression. Written material given at graduation.    Education: Exercise & Equipment Safety: - Individual verbal instruction and demonstration of equipment use and safety with use of the equipment. Flowsheet Row Pulmonary Rehab from 04/08/2024 in [facility] Cardiac and Pulmonary Rehab  Date 04/08/24  Educator NT  Instruction Review Code 1- Verbalizes Understanding       Education: Exercise Physiology & General Exercise Guidelines: - Group verbal and written instruction with models to review the exercise physiology of the cardiovascular system and associated critical values. Provides general exercise guidelines with specific guidelines to those with heart or lung disease.    Education: Flexibility, Balance, Mind/Body Relaxation: - Group verbal and visual presentation with interactive activity on the components of exercise prescription. Introduces  F.I.T.T principle from ACSM for exercise prescriptions. Reviews F.I.T.T. principles of flexibility and balance exercise training including progression. Also discusses the mind body connection.  Reviews various relaxation techniques to help reduce and manage stress (i.e. Deep breathing, progressive muscle relaxation, and visualization). Balance handout provided to take home. Written material given at graduation.   Activity Barriers & Risk Stratification:  Activity Barriers & Cardiac Risk Stratification - 04/03/24 1015       Activity Barriers & Cardiac Risk Stratification   Activity Barriers Joint Problems;Back Problems;Balance Concerns;Muscular Weakness   right ankle replacement            6 Minute Walk:  6 Minute Walk     Row Name 04/08/24 1032         6 Minute Walk   Phase Initial     Distance 1165 feet     Walk Time 6 minutes     # of Rest Breaks 0     MPH  2.21     METS 2.11     RPE 12     Perceived Dyspnea  2     VO2 Peak 7.38     Symptoms No     Resting HR 55 bpm     Resting BP 112/58     Resting Oxygen Saturation  99 %     Exercise Oxygen Saturation  during 6 min walk 97 %     Max Ex. HR 88 bpm     Max Ex. BP 148/62     2 Minute Post BP 122/58       Interval HR   1 Minute HR 78     2 Minute HR 79     3 Minute HR 82     4 Minute HR 84     5 Minute HR 88     6 Minute HR 87     2 Minute Post HR 55     Interval Heart Rate? Yes       Interval Oxygen   Interval Oxygen? Yes     Baseline Oxygen Saturation % 99 %     1 Minute Oxygen Saturation % 98 %     1 Minute Liters of Oxygen 0 L  RA     2 Minute Oxygen Saturation % 98 %     2 Minute Liters of Oxygen 0 L     3 Minute Oxygen Saturation % 98 %     3 Minute Liters of Oxygen 0 L     4 Minute Oxygen Saturation % 97 %     4 Minute Liters of Oxygen 0 L     5 Minute Oxygen Saturation % 98 %     5 Minute Liters of Oxygen 0 L     6 Minute Oxygen Saturation % 99 %     6 Minute Liters of Oxygen 0 L     2 Minute  Post Oxygen Saturation % 99 %     2 Minute Post Liters of Oxygen 0 L              Oxygen Initial Assessment:  Oxygen Initial Assessment - 04/03/24 1017       Home Oxygen   Home Oxygen Device None    Sleep Oxygen Prescription CPAP    Home Exercise Oxygen Prescription None    Home Resting Oxygen Prescription None    Compliance with Home Oxygen Use Yes      Intervention   Short Term Goals To learn and understand importance of monitoring SPO2 with pulse oximeter and demonstrate accurate use of the pulse oximeter.;To learn and understand importance of maintaining oxygen saturations>88%;To learn and demonstrate proper pursed lip breathing techniques or other breathing techniques. ;To learn and demonstrate proper use of respiratory medications    Long  Term Goals Verbalizes importance of monitoring SPO2 with pulse oximeter and return demonstration;Maintenance of O2 saturations>88%;Exhibits proper breathing techniques, such as pursed lip breathing or other method taught during program session;Compliance with respiratory medication;Demonstrates proper use of MDI's             Oxygen Re-Evaluation:   Oxygen Discharge (Final Oxygen Re-Evaluation):   Initial Exercise Prescription:  Initial Exercise Prescription - 04/08/24 1000       Date of Initial Exercise RX and Referring Provider   Date 04/08/24    Referring Provider Dr. Artie Laster, MD      Oxygen   Maintain Oxygen Saturation 88% or higher  Treadmill   MPH 2.5    Grade 0    Minutes 15    METs 2.91      NuStep   Level 2   T6 nustep   SPM 80    Minutes 15    METs 2.11      REL-XR   Level 2    Speed 50    Minutes 15    METs 2.11      Prescription Details   Frequency (times per week) 2    Duration Progress to 30 minutes of continuous aerobic without signs/symptoms of physical distress      Intensity   THRR 40-80% of Max Heartrate 89-123    Ratings of Perceived Exertion 11-13    Perceived Dyspnea 0-4       Progression   Progression Continue to progress workloads to maintain intensity without signs/symptoms of physical distress.      Resistance Training   Training Prescription Yes    Weight 8 lb    Reps 10-15             Perform Capillary Blood Glucose checks as needed.  Exercise Prescription Changes:   Exercise Prescription Changes     Row Name 04/08/24 1000             Response to Exercise   Blood Pressure (Admit) 112/58       Blood Pressure (Exercise) 148/62       Blood Pressure (Exit) 122/58       Heart Rate (Admit) 55 bpm       Heart Rate (Exercise) 88 bpm       Heart Rate (Exit) 55 bpm       Oxygen Saturation (Admit) 99 %       Oxygen Saturation (Exercise) 97 %       Oxygen Saturation (Exit) 99 %       Rating of Perceived Exertion (Exercise) 12       Perceived Dyspnea (Exercise) 2       Symptoms none       Comments Results                Exercise Comments:   Exercise Goals and Review:   Exercise Goals     Row Name 04/08/24 1030             Exercise Goals   Increase Physical Activity Yes       Intervention Provide advice, education, support and counseling about physical activity/exercise needs.;Develop an individualized exercise prescription for aerobic and resistive training based on initial evaluation findings, risk stratification, comorbidities and participant's personal goals.       Expected Outcomes Short Term: Attend rehab on a regular basis to increase amount of physical activity.;Long Term: Add in home exercise to make exercise part of routine and to increase amount of physical activity.;Long Term: Exercising regularly at least 3-5 days a week.       Increase Strength and Stamina Yes       Intervention Develop an individualized exercise prescription for aerobic and resistive training based on initial evaluation findings, risk stratification, comorbidities and participant's personal goals.;Provide advice, education, support and  counseling about physical activity/exercise needs.       Expected Outcomes Short Term: Increase workloads from initial exercise prescription for resistance, speed, and METs.;Short Term: Perform resistance training exercises routinely during rehab and add in resistance training at home;Long Term: Improve cardiorespiratory fitness, muscular endurance and strength as measured by increased METs and  functional capacity ( )       Able to understand and use rate of perceived exertion (RPE) scale Yes       Intervention Provide education and explanation on how to use RPE scale       Expected Outcomes Short Term: Able to use RPE daily in rehab to express subjective intensity level;Long Term:  Able to use RPE to guide intensity level when exercising independently       Able to understand and use Dyspnea scale Yes       Intervention Provide education and explanation on how to use Dyspnea scale       Expected Outcomes Short Term: Able to use Dyspnea scale daily in rehab to express subjective sense of shortness of breath during exertion;Long Term: Able to use Dyspnea scale to guide intensity level when exercising independently       Knowledge and understanding of Target Heart Rate Range (THRR) Yes       Intervention Provide education and explanation of THRR including how the numbers were predicted and where they are located for reference       Expected Outcomes Short Term: Able to state/look up THRR;Long Term: Able to use THRR to govern intensity when exercising independently;Short Term: Able to use daily as guideline for intensity in rehab       Able to check pulse independently Yes       Intervention Provide education and demonstration on how to check pulse in carotid and radial arteries.;Review the importance of being able to check your own pulse for safety during independent exercise       Expected Outcomes Long Term: Able to check pulse independently and accurately;Short Term: Able to explain why pulse  checking is important during independent exercise       Understanding of Exercise Prescription Yes       Intervention Provide education, explanation, and written materials on patient's individual exercise prescription       Expected Outcomes Short Term: Able to explain program exercise prescription;Long Term: Able to explain home exercise prescription to exercise independently                Exercise Goals Re-Evaluation :   Discharge Exercise Prescription (Final Exercise Prescription Changes):  Exercise Prescription Changes - 04/08/24 1000       Response to Exercise   Blood Pressure (Admit) 112/58    Blood Pressure (Exercise) 148/62    Blood Pressure (Exit) 122/58    Heart Rate (Admit) 55 bpm    Heart Rate (Exercise) 88 bpm    Heart Rate (Exit) 55 bpm    Oxygen Saturation (Admit) 99 %    Oxygen Saturation (Exercise) 97 %    Oxygen Saturation (Exit) 99 %    Rating of Perceived Exertion (Exercise) 12    Perceived Dyspnea (Exercise) 2    Symptoms none    Comments Results             Nutrition:  Target Goals: Understanding of nutrition guidelines, daily intake of sodium 1500mg , cholesterol 200mg , calories 30% from fat and 7% or less from saturated fats, daily to have 5 or more servings of fruits and vegetables.  Education: All About Nutrition: -Group instruction provided by verbal, written material, interactive activities, discussions, models, and posters to present general guidelines for heart healthy nutrition including fat, fiber, MyPlate, the role of sodium in heart healthy nutrition, utilization of the nutrition label, and utilization of this knowledge for meal planning. Follow up email sent  as well. Written material given at graduation.   Biometrics:  Pre Biometrics - 04/08/24 1031       Pre Biometrics   Height 6' 0.75" (1.848 m)    Weight 217 lb 3.2 oz (98.5 kg)    Waist Circumference 39.5 inches    Hip Circumference 43 inches    Waist to Hip Ratio  0.92 %    BMI (Calculated) 28.85    Single Leg Stand 2.9 seconds              Nutrition Therapy Plan and Nutrition Goals:  Nutrition Therapy & Goals - 04/08/24 1015       Nutrition Therapy   Diet Mediterranean    Protein (specify units) 70-90g    Fiber 30 grams    Whole Grain Foods 3 servings    Saturated Fats 15 max. grams    Fruits and Vegetables 5 servings/day    Sodium 2 grams      Personal Nutrition Goals   Nutrition Goal Eat 15-30gProtein and 30-60gCarbs at each meal.    Personal Goal #2 Eat 3 times per day, small frequent meals or nutrient dense snacks    Comments Patient drinking ~48oz of water  daily. He reports he usually eats 3 meals per day. Sometimes will miss lunch if he busy doing something. Encouraged him to have a snack if he thinks he might miss a meal. He agrees he should do that. Brainstormed several meal and snack ideas focusing on balancing carbs with protein or healthy fats. Provided mediterranean diet handout. Educated on the importance of meeting his nutritional goals with COPD.      Intervention Plan   Intervention Prescribe, educate and counsel regarding individualized specific dietary modifications aiming towards targeted core components such as weight, hypertension, lipid management, diabetes, heart failure and other comorbidities.;Nutrition handout(s) given to patient.    Expected Outcomes Long Term Goal: Adherence to prescribed nutrition plan.;Short Term Goal: A plan has been developed with personal nutrition goals set during dietitian appointment.;Short Term Goal: Understand basic principles of dietary content, such as calories, fat, sodium, cholesterol and nutrients.             Nutrition Assessments:  MEDIFICTS Score Key: >=70 Need to make dietary changes  40-70 Heart Healthy Diet <= 40 Therapeutic Level Cholesterol Diet  Flowsheet Row Pulmonary Rehab from 04/08/2024 in [facility] Cardiac and Pulmonary Rehab  Picture Your Plate Total Score on  Admission 75      Picture Your Plate Scores: <96 Unhealthy dietary pattern with much room for improvement. 41-50 Dietary pattern unlikely to meet recommendations for good health and room for improvement. 51-60 More healthful dietary pattern, with some room for improvement.  >60 Healthy dietary pattern, although there may be some specific behaviors that could be improved.    Nutrition Goals Re-Evaluation:   Nutrition Goals Discharge (Final Nutrition Goals Re-Evaluation):   Psychosocial: Target Goals: Acknowledge presence or absence of significant depression and/or stress, maximize coping skills, provide positive support system. Participant is able to verbalize types and ability to use techniques and skills needed for reducing stress and depression.   Education: Stress, Anxiety, and Depression - Group verbal and visual presentation to define topics covered.  Reviews how body is impacted by stress, anxiety, and depression.  Also discusses healthy ways to reduce stress and to treat/manage anxiety and depression.  Written material given at graduation.   Education: Sleep Hygiene -Provides group verbal and written instruction about how sleep can affect your health.  Define sleep  hygiene, discuss sleep cycles and impact of sleep habits. Review good sleep hygiene tips.    Initial Review & Psychosocial Screening:  Initial Psych Review & Screening - 04/03/24 1021       Initial Review   Current issues with History of Depression      Family Dynamics   Good Support System? Yes   family, friends     Barriers   Psychosocial barriers to participate in program There are no identifiable barriers or psychosocial needs.;The patient should benefit from training in stress management and relaxation.      Screening Interventions   Interventions Encouraged to exercise    Expected Outcomes Short Term goal: Utilizing psychosocial counselor, staff and physician to assist with identification of specific  Stressors or current issues interfering with healing process. Setting desired goal for each stressor or current issue identified.;Long Term Goal: Stressors or current issues are controlled or eliminated.;Short Term goal: Identification and review with participant of any Quality of Life or Depression concerns found by scoring the questionnaire.;Long Term goal: The participant improves quality of Life and PHQ9 Scores as seen by post scores and/or verbalization of changes             Quality of Life Scores:   Scores of 19 and below usually indicate a poorer quality of life in these areas.  A difference of  2-3 points is a clinically meaningful difference.  A difference of 2-3 points in the total score of the Quality of Life Index has been associated with significant improvement in overall quality of life, self-image, physical symptoms, and general health in studies assessing change in quality of life.  PHQ-9: Review Flowsheet       04/08/2024  Depression screen PHQ 2/9  Decreased Interest 1  Down, Depressed, Hopeless 2  PHQ - 2 Score 3  Altered sleeping 0  Tired, decreased energy 1  Change in appetite 2  Feeling bad or failure about yourself  0  Trouble concentrating 1  Moving slowly or fidgety/restless 1  Suicidal thoughts 0  PHQ-9 Score 8  Difficult doing work/chores Somewhat difficult   Interpretation of Total Score  Total Score Depression Severity:  1-4 = Minimal depression, 5-9 = Mild depression, 10-14 = Moderate depression, 15-19 = Moderately severe depression, 20-27 = Severe depression   Psychosocial Evaluation and Intervention:  Psychosocial Evaluation - 04/03/24 1024       Psychosocial Evaluation & Interventions   Interventions Encouraged to exercise with the program and follow exercise prescription    Comments Mr. Raybourn is coming to Pulmonary Rehab with COPD. He states he has no concerns currently with stress or anxiety/depression, but does have a history of PTSD. He  has a great support system and feels like he is managing his health well. He is interested in learning more about risk factor management and increasing his stamina while in the program.    Expected Outcomes Short: attend pulmonary rehab for exercise and education. Long; develop and maintain positive self care habits    Continue Psychosocial Services  Follow up required by staff             Psychosocial Re-Evaluation:   Psychosocial Discharge (Final Psychosocial Re-Evaluation):   Vocational Rehabilitation: Provide vocational rehab assistance to qualifying candidates.   Vocational Rehab Evaluation & Intervention:  Vocational Rehab - 04/03/24 1020       Initial Vocational Rehab Evaluation & Intervention   Assessment shows need for Vocational Rehabilitation No  Education: Education Goals: Education classes will be provided on a variety of topics geared toward better understanding of heart health and risk factor modification. Participant will state understanding/return demonstration of topics presented as noted by education test scores.  Learning Barriers/Preferences:  Learning Barriers/Preferences - 04/03/24 1020       Learning Barriers/Preferences   Learning Barriers None    Learning Preferences None             General Cardiac Education Topics:  AED/CPR: - Group verbal and written instruction with the use of models to demonstrate the basic use of the AED with the basic ABC's of resuscitation.   Anatomy and Cardiac Procedures: - Group verbal and visual presentation and models provide information about basic cardiac anatomy and function. Reviews the testing methods done to diagnose heart disease and the outcomes of the test results. Describes the treatment choices: Medical Management, Angioplasty, or Coronary Bypass Surgery for treating various heart conditions including Myocardial Infarction, Angina, Valve Disease, and Cardiac Arrhythmias.  Written  material given at graduation.   Medication Safety: - Group verbal and visual instruction to review commonly prescribed medications for heart and lung disease. Reviews the medication, class of the drug, and side effects. Includes the steps to properly store meds and maintain the prescription regimen.  Written material given at graduation.   Intimacy: - Group verbal instruction through game format to discuss how heart and lung disease can affect sexual intimacy. Written material given at graduation..   Know Your Numbers and Heart Failure: - Group verbal and visual instruction to discuss disease risk factors for cardiac and pulmonary disease and treatment options.  Reviews associated critical values for Overweight/Obesity, Hypertension, Cholesterol, and Diabetes.  Discusses basics of heart failure: signs/symptoms and treatments.  Introduces Heart Failure Zone chart for action plan for heart failure.  Written material given at graduation.   Infection Prevention: - Provides verbal and written material to individual with discussion of infection control including proper hand washing and proper equipment cleaning during exercise session. Flowsheet Row Pulmonary Rehab from 04/08/2024 in [facility] Cardiac and Pulmonary Rehab  Date 04/08/24  Educator NT  Instruction Review Code 1- Verbalizes Understanding       Falls Prevention: - Provides verbal and written material to individual with discussion of falls prevention and safety. Flowsheet Row Pulmonary Rehab from 04/08/2024 in [facility] Cardiac and Pulmonary Rehab  Date 04/08/24  Educator NT  Instruction Review Code 1- Verbalizes Understanding       Other: -Provides group and verbal instruction on various topics (see comments)   Knowledge Questionnaire Score:  Knowledge Questionnaire Score - 04/08/24 1020       Knowledge Questionnaire Score   Pre Score 15/18             Core Components/Risk Factors/Patient Goals at Admission:  Personal  Goals and Risk Factors at Admission - 04/03/24 1019       Core Components/Risk Factors/Patient Goals on Admission    Weight Management Yes;Weight Maintenance    Intervention Weight Management: Develop a combined nutrition and exercise program designed to reach desired caloric intake, while maintaining appropriate intake of nutrient and fiber, sodium and fats, and appropriate energy expenditure required for the weight goal.;Weight Management: Provide education and appropriate resources to help participant work on and attain dietary goals.;Weight Management/Obesity: Establish reasonable short term and long term weight goals.    Expected Outcomes Weight Maintenance: Understanding of the daily nutrition guidelines, which includes 25-35% calories from fat, 7% or less cal from saturated  fats, less than 200mg  cholesterol, less than 1.5gm of sodium, & 5 or more servings of fruits and vegetables daily;Long Term: Adherence to nutrition and physical activity/exercise program aimed toward attainment of established weight goal;Short Term: Continue to assess and modify interventions until short term weight is achieved    Improve shortness of breath with ADL's Yes    Intervention Provide education, individualized exercise plan and daily activity instruction to help decrease symptoms of SOB with activities of daily living.    Expected Outcomes Short Term: Improve cardiorespiratory fitness to achieve a reduction of symptoms when performing ADLs;Long Term: Be able to perform more ADLs without symptoms or delay the onset of symptoms    Hypertension Yes    Intervention Provide education on lifestyle modifcations including regular physical activity/exercise, weight management, moderate sodium restriction and increased consumption of fresh fruit, vegetables, and low fat dairy, alcohol  moderation, and smoking cessation.;Monitor prescription use compliance.    Expected Outcomes Short Term: Continued assessment and intervention  until BP is < 140/6mm HG in hypertensive participants. < 130/45mm HG in hypertensive participants with diabetes, heart failure or chronic kidney disease.;Long Term: Maintenance of blood pressure at goal levels.    Lipids Yes    Intervention Provide education and support for participant on nutrition & aerobic/resistive exercise along with prescribed medications to achieve LDL 70mg , HDL >40mg .    Expected Outcomes Short Term: Participant states understanding of desired cholesterol values and is compliant with medications prescribed. Participant is following exercise prescription and nutrition guidelines.;Long Term: Cholesterol controlled with medications as prescribed, with individualized exercise RX and with personalized nutrition plan. Value goals: LDL < 70mg , HDL > 40 mg.             Education:Diabetes - Individual verbal and written instruction to review signs/symptoms of diabetes, desired ranges of glucose level fasting, after meals and with exercise. Acknowledge that pre and post exercise glucose checks will be done for 3 sessions at entry of program.   Core Components/Risk Factors/Patient Goals Review:    Core Components/Risk Factors/Patient Goals at Discharge (Final Review):    ITP Comments:  ITP Comments     Row Name 04/03/24 1026 04/08/24 1028         ITP Comments Initial phone call completed. Diagnosis can be found in [facility] 5/7. EP Orientation scheduled for Monday 5/19 at 9am. Completed and gym orientation for pulmonary rehab. Initial ITP created and sent for review to Dr. Faud Aleskerov, Medical Director.               Comments: Initial ITP

## 2024-04-08 NOTE — Progress Notes (Signed)
 Assessment start time: 9:52 AM  Digestive issues/concerns: no known food allergies  Beverages water  (48oz), coconut milk  Education r/t nutrition plan Patient drinking ~48oz of water  daily. He reports he usually eats 3 meals per day. Sometimes will miss lunch if he busy doing something. Encouraged him to have a snack if he thinks he might miss a meal. He agrees he should do that. Brainstormed several meal and snack ideas focusing on balancing carbs with protein or healthy fats. Provided mediterranean diet handout. Educated on the importance of meeting his nutritional goals with COPD.    Goal 1: Eat 15-30gProtein and 30-60gCarbs at each meal. Goal 2: Eat 3 times per day, small frequent meals or nutrient dense snacks   End time 10:09 AM

## 2024-04-08 NOTE — Patient Instructions (Addendum)
 Patient Instructions  Patient Details  Name: Wesley Weber MRN: [id number] Date of Birth: Apr 18, 1946 Referring Provider:  Tressia Fry, MD  Below are your personal goals for exercise, nutrition, and risk factors. Our goal is to help you stay on track towards obtaining and maintaining these goals. We will be discussing your progress on these goals with you throughout the program.  Initial Exercise Prescription:  Initial Exercise Prescription - 04/08/24 1000       Date of Initial Exercise RX and Referring Provider   Date 04/08/24    Referring Provider Dr. Artie Laster, MD      Oxygen   Maintain Oxygen Saturation 88% or higher      Treadmill   MPH 2.5    Grade 0    Minutes 15    METs 2.91      NuStep   Level 2   T6 nustep   SPM 80    Minutes 15    METs 2.11      REL-XR   Level 2    Speed 50    Minutes 15    METs 2.11      Prescription Details   Frequency (times per week) 2    Duration Progress to 30 minutes of continuous aerobic without signs/symptoms of physical distress      Intensity   THRR 40-80% of Max Heartrate 89-123    Ratings of Perceived Exertion 11-13    Perceived Dyspnea 0-4      Progression   Progression Continue to progress workloads to maintain intensity without signs/symptoms of physical distress.      Resistance Training   Training Prescription Yes    Weight 8 lb    Reps 10-15             Exercise Goals: Frequency: Be able to perform aerobic exercise two to three times per week in program working toward 2-5 days per week of home exercise.  Intensity: Work with a perceived exertion of 11 (fairly light) - 15 (hard) while following your exercise prescription.  We will make changes to your prescription with you as you progress through the program.   Duration: Be able to do 30 to 45 minutes of continuous aerobic exercise in addition to a 5 minute warm-up and a 5 minute cool-down routine.   Nutrition Goals: Your personal nutrition  goals will be established when you do your nutrition analysis with the dietician.  The following are general nutrition guidelines to follow: Cholesterol < 200mg /day Sodium < 1500mg /day Fiber: Men over 50 yrs - 30 grams per day  Personal Goals:  Personal Goals and Risk Factors at Admission - 04/03/24 1019       Core Components/Risk Factors/Patient Goals on Admission    Weight Management Yes;Weight Maintenance    Intervention Weight Management: Develop a combined nutrition and exercise program designed to reach desired caloric intake, while maintaining appropriate intake of nutrient and fiber, sodium and fats, and appropriate energy expenditure required for the weight goal.;Weight Management: Provide education and appropriate resources to help participant work on and attain dietary goals.;Weight Management/Obesity: Establish reasonable short term and long term weight goals.    Expected Outcomes Weight Maintenance: Understanding of the daily nutrition guidelines, which includes 25-35% calories from fat, 7% or less cal from saturated fats, less than 200mg  cholesterol, less than 1.5gm of sodium, & 5 or more servings of fruits and vegetables daily;Long Term: Adherence to nutrition and physical activity/exercise program aimed toward attainment of established weight goal;Short  Term: Continue to assess and modify interventions until short term weight is achieved    Improve shortness of breath with ADL's Yes    Intervention Provide education, individualized exercise plan and daily activity instruction to help decrease symptoms of SOB with activities of daily living.    Expected Outcomes Short Term: Improve cardiorespiratory fitness to achieve a reduction of symptoms when performing ADLs;Long Term: Be able to perform more ADLs without symptoms or delay the onset of symptoms    Hypertension Yes    Intervention Provide education on lifestyle modifcations including regular physical activity/exercise, weight  management, moderate sodium restriction and increased consumption of fresh fruit, vegetables, and low fat dairy, alcohol  moderation, and smoking cessation.;Monitor prescription use compliance.    Expected Outcomes Short Term: Continued assessment and intervention until BP is < 140/60mm HG in hypertensive participants. < 130/35mm HG in hypertensive participants with diabetes, heart failure or chronic kidney disease.;Long Term: Maintenance of blood pressure at goal levels.    Lipids Yes    Intervention Provide education and support for participant on nutrition & aerobic/resistive exercise along with prescribed medications to achieve LDL 70mg , HDL >40mg .    Expected Outcomes Short Term: Participant states understanding of desired cholesterol values and is compliant with medications prescribed. Participant is following exercise prescription and nutrition guidelines.;Long Term: Cholesterol controlled with medications as prescribed, with individualized exercise RX and with personalized nutrition plan. Value goals: LDL < 70mg , HDL > 40 mg.            Exercise Goals and Review:  Exercise Goals     Row Name 04/08/24 1030             Exercise Goals   Increase Physical Activity Yes       Intervention Provide advice, education, support and counseling about physical activity/exercise needs.;Develop an individualized exercise prescription for aerobic and resistive training based on initial evaluation findings, risk stratification, comorbidities and participant's personal goals.       Expected Outcomes Short Term: Attend rehab on a regular basis to increase amount of physical activity.;Long Term: Add in home exercise to make exercise part of routine and to increase amount of physical activity.;Long Term: Exercising regularly at least 3-5 days a week.       Increase Strength and Stamina Yes       Intervention Develop an individualized exercise prescription for aerobic and resistive training based on  initial evaluation findings, risk stratification, comorbidities and participant's personal goals.;Provide advice, education, support and counseling about physical activity/exercise needs.       Expected Outcomes Short Term: Increase workloads from initial exercise prescription for resistance, speed, and METs.;Short Term: Perform resistance training exercises routinely during rehab and add in resistance training at home;Long Term: Improve cardiorespiratory fitness, muscular endurance and strength as measured by increased METs and functional capacity ( )       Able to understand and use rate of perceived exertion (RPE) scale Yes       Intervention Provide education and explanation on how to use RPE scale       Expected Outcomes Short Term: Able to use RPE daily in rehab to express subjective intensity level;Long Term:  Able to use RPE to guide intensity level when exercising independently       Able to understand and use Dyspnea scale Yes       Intervention Provide education and explanation on how to use Dyspnea scale       Expected Outcomes Short Term: Able to use Dyspnea  scale daily in rehab to express subjective sense of shortness of breath during exertion;Long Term: Able to use Dyspnea scale to guide intensity level when exercising independently       Knowledge and understanding of Target Heart Rate Range (THRR) Yes       Intervention Provide education and explanation of THRR including how the numbers were predicted and where they are located for reference       Expected Outcomes Short Term: Able to state/look up THRR;Long Term: Able to use THRR to govern intensity when exercising independently;Short Term: Able to use daily as guideline for intensity in rehab       Able to check pulse independently Yes       Intervention Provide education and demonstration on how to check pulse in carotid and radial arteries.;Review the importance of being able to check your own pulse for safety during independent  exercise       Expected Outcomes Long Term: Able to check pulse independently and accurately;Short Term: Able to explain why pulse checking is important during independent exercise       Understanding of Exercise Prescription Yes       Intervention Provide education, explanation, and written materials on patient's individual exercise prescription       Expected Outcomes Short Term: Able to explain program exercise prescription;Long Term: Able to explain home exercise prescription to exercise independently

## 2024-04-10 ENCOUNTER — Encounter: Admitting: *Deleted

## 2024-04-10 DIAGNOSIS — J441 Chronic obstructive pulmonary disease with (acute) exacerbation: Secondary | ICD-10-CM | POA: Diagnosis not present

## 2024-04-10 DIAGNOSIS — J449 Chronic obstructive pulmonary disease, unspecified: Secondary | ICD-10-CM

## 2024-04-10 NOTE — Progress Notes (Signed)
 Daily Session Note  Patient Details  Name: CLEVEN JANSMA MRN: [id number] Date of Birth: 28-Mar-1946 Referring Provider:   Gattis Kass Pulmonary Rehab from 04/08/2024 in [facility] Cardiac and Pulmonary Rehab  Referring Provider Dr. Artie Laster, MD       Encounter Date: 04/10/2024  Check In:  Session Check In - 04/10/24 1350       Check-In   Supervising physician immediately available to respond to emergencies See telemetry face sheet for immediately available ER MD    Location [facility]-Cardiac & Pulmonary Rehab    Staff Present Sue Em RN,BSN;Kelly Sabra Cramp BS, ACSM CEP, Exercise Physiologist;Noah Tickle, BS, Exercise Physiologist    Virtual Visit No    Medication changes reported     No    Fall or balance concerns reported    No    Warm-up and Cool-down Performed on first and last piece of equipment    Resistance Training Performed Yes    VAD Patient? No    PAD/SET Patient? No      Pain Assessment   Currently in Pain? No/denies                Social History   Tobacco Use  Smoking Status Former   Current packs/day: 0.00   Types: Cigarettes   Start date: 09/06/1976   Quit date: 09/06/1986   Years since quitting: 37.6  Smokeless Tobacco Never    Goals Met:  Independence with exercise equipment Exercise tolerated well No report of concerns or symptoms today Strength training completed today  Goals Unmet:  Not Applicable  Comments: First full day of exercise!  Patient was oriented to gym and equipment including functions, settings, policies, and procedures.  Patient's individual exercise prescription and treatment plan were reviewed.  All starting workloads were established based on the results of the 6 minute walk test done at initial orientation visit.  The plan for exercise progression was also introduced and progression will be customized based on patient's performance and goals.  Second Session of the Day: Today's session included resistance and balance  exercises during education.   Dr. Firman Hughes is Medical Director for [facility] Cardiac Rehabilitation.  Dr. Fuad Aleskerov is Medical Director for [facility] Pulmonary Rehabilitation.

## 2024-04-11 ENCOUNTER — Encounter: Admitting: *Deleted

## 2024-04-11 DIAGNOSIS — J449 Chronic obstructive pulmonary disease, unspecified: Secondary | ICD-10-CM

## 2024-04-11 DIAGNOSIS — J441 Chronic obstructive pulmonary disease with (acute) exacerbation: Secondary | ICD-10-CM | POA: Diagnosis not present

## 2024-04-11 NOTE — Progress Notes (Signed)
 Daily Session Note  Patient Details  Name: Wesley Weber MRN: [id number] Date of Birth: 05-13-1946 Referring Provider:   Gattis Kass Pulmonary Rehab from 04/08/2024 in [facility] Cardiac and Pulmonary Rehab  Referring Provider Dr. Artie Laster, MD       Encounter Date: 04/11/2024  Check In:  Session Check In - 04/11/24 1348       Check-In   Supervising physician immediately available to respond to emergencies See telemetry face sheet for immediately available ER MD    Location [facility]-Cardiac & Pulmonary Rehab    Staff Present Sue Em RN,BSN;Joseph [facility] BS, Exercise Physiologist;Noah Tickle, BS, Exercise Physiologist    Virtual Visit No    Medication changes reported     No    Fall or balance concerns reported    No    Warm-up and Cool-down Performed on first and last piece of equipment    Resistance Training Performed Yes    VAD Patient? No    PAD/SET Patient? No      Pain Assessment   Currently in Pain? No/denies                Social History   Tobacco Use  Smoking Status Former   Current packs/day: 0.00   Types: Cigarettes   Start date: 09/06/1976   Quit date: 09/06/1986   Years since quitting: 37.6  Smokeless Tobacco Never    Goals Met:  Independence with exercise equipment Exercise tolerated well No report of concerns or symptoms today Strength training completed today  Goals Unmet:  Not Applicable  Comments: Pt able to follow exercise prescription today without complaint.  Will continue to monitor for progression.    Dr. Firman Hughes is Medical Director for [facility] Cardiac Rehabilitation.  Dr. Fuad Aleskerov is Medical Director for [facility] Pulmonary Rehabilitation.

## 2024-04-17 ENCOUNTER — Encounter: Admitting: *Deleted

## 2024-04-17 DIAGNOSIS — J449 Chronic obstructive pulmonary disease, unspecified: Secondary | ICD-10-CM

## 2024-04-17 DIAGNOSIS — J441 Chronic obstructive pulmonary disease with (acute) exacerbation: Secondary | ICD-10-CM | POA: Diagnosis not present

## 2024-04-17 NOTE — Progress Notes (Signed)
 Daily Session Note  Patient Details  Name: Wesley Weber MRN: [id number] Date of Birth: 26-Aug-1946 Referring Provider:   Gattis Kass Pulmonary Rehab from 04/08/2024 in [facility] Cardiac and Pulmonary Rehab  Referring Provider Dr. Artie Laster, MD       Encounter Date: 04/17/2024  Check In:  Session Check In - 04/17/24 1401       Check-In   Supervising physician immediately available to respond to emergencies See telemetry face sheet for immediately available ER MD    Location [facility]-Cardiac & Pulmonary Rehab    Staff Present Sue Em RN,BSN;Susanne Bice, RN, BSN, CCRP;Kelly Hayes BS, ACSM CEP, Exercise Physiologist;Noah Tickle, BS, Exercise Physiologist    Virtual Visit No    Medication changes reported     No    Fall or balance concerns reported    No    Warm-up and Cool-down Performed on first and last piece of equipment    Resistance Training Performed Yes    VAD Patient? No    PAD/SET Patient? No      Pain Assessment   Currently in Pain? No/denies                Social History   Tobacco Use  Smoking Status Former   Current packs/day: 0.00   Types: Cigarettes   Start date: 09/06/1976   Quit date: 09/06/1986   Years since quitting: 37.6  Smokeless Tobacco Never    Goals Met:  Independence with exercise equipment Exercise tolerated well No report of concerns or symptoms today Strength training completed today  Goals Unmet:  Not Applicable  Comments: Pt able to follow exercise prescription today without complaint.  Will continue to monitor for progression.    Dr. Firman Hughes is Medical Director for [facility] Cardiac Rehabilitation.  Dr. Fuad Aleskerov is Medical Director for [facility] Pulmonary Rehabilitation.

## 2024-04-18 ENCOUNTER — Encounter: Admitting: *Deleted

## 2024-04-18 DIAGNOSIS — J449 Chronic obstructive pulmonary disease, unspecified: Secondary | ICD-10-CM

## 2024-04-18 DIAGNOSIS — J441 Chronic obstructive pulmonary disease with (acute) exacerbation: Secondary | ICD-10-CM | POA: Diagnosis not present

## 2024-04-18 NOTE — Progress Notes (Signed)
 Daily Session Note  Patient Details  Name: Wesley Weber MRN: [id number] Date of Birth: 10/03/1946 Referring Provider:   Gattis Kass Pulmonary Rehab from 04/08/2024 in [facility] Cardiac and Pulmonary Rehab  Referring Provider Dr. Artie Laster, MD       Encounter Date: 04/18/2024  Check In:  Session Check In - 04/18/24 1343       Check-In   Supervising physician immediately available to respond to emergencies See telemetry face sheet for immediately available ER MD    Location [facility]-Cardiac & Pulmonary Rehab    Staff Present Sue Em RN,BSN;Maxon Conetta BS, Exercise Physiologist;Noah Tickle, BS, Exercise Physiologist;Joseph Lacinda Pica RCP,RRT,BSRT    Virtual Visit No    Medication changes reported     No    Fall or balance concerns reported    No    Warm-up and Cool-down Performed on first and last piece of equipment    Resistance Training Performed Yes    VAD Patient? No    PAD/SET Patient? No      Pain Assessment   Currently in Pain? No/denies                Social History   Tobacco Use  Smoking Status Former   Current packs/day: 0.00   Types: Cigarettes   Start date: 09/06/1976   Quit date: 09/06/1986   Years since quitting: 37.6  Smokeless Tobacco Never    Goals Met:  Independence with exercise equipment Exercise tolerated well No report of concerns or symptoms today Strength training completed today  Goals Unmet:  Not Applicable  Comments: Pt able to follow exercise prescription today without complaint.  Will continue to monitor for progression.    Dr. Firman Hughes is Medical Director for [facility] Cardiac Rehabilitation.  Dr. Fuad Aleskerov is Medical Director for [facility] Pulmonary Rehabilitation.

## 2024-04-24 ENCOUNTER — Encounter: Payer: Self-pay | Admitting: *Deleted

## 2024-04-24 ENCOUNTER — Encounter: Admitting: *Deleted

## 2024-04-24 DIAGNOSIS — J449 Chronic obstructive pulmonary disease, unspecified: Secondary | ICD-10-CM | POA: Diagnosis present

## 2024-04-24 NOTE — Progress Notes (Signed)
 Pulmonary Individual Treatment Plan  Patient Details  Name: Wesley Weber MRN: [id number] Date of Birth: 01/27/1946 Referring Provider:   Gattis Kass Pulmonary Rehab from 04/08/2024 in [facility] Cardiac and Pulmonary Rehab  Referring Provider Dr. Artie Laster, MD       Initial Encounter Date:  Flowsheet Row Pulmonary Rehab from 04/08/2024 in [facility] Cardiac and Pulmonary Rehab  Date 04/08/24       Visit Diagnosis: Chronic obstructive pulmonary disease, unspecified COPD type (HCC)  Patient's Home Medications on Admission:  Current Outpatient Medications:    albuterol  (PROVENTIL  HFA;VENTOLIN  HFA) 108 (90 Base) MCG/ACT inhaler, Inhale 2 puffs into the lungs every 4 (four) hours as needed for wheezing or shortness of breath., Disp: , Rfl:    amLODipine  (NORVASC ) 5 MG tablet, Take 5 mg by mouth daily., Disp: , Rfl:    aspirin  EC 81 MG tablet, Take 81 mg by mouth daily. (Patient not taking: Reported on 04/03/2024), Disp: , Rfl:    azelastine (ASTELIN) 0.1 % nasal spray, Place 1 spray into both nostrils 2 (two) times daily. Use in each nostril as directed (Patient not taking: Reported on 04/03/2024), Disp: , Rfl:    budesonide (RHINOCORT AQUA) 32 MCG/ACT nasal spray, Place 1 spray into both nostrils 2 (two) times daily., Disp: , Rfl:    chlorpheniramine (CHLOR-TRIMETON) 4 MG tablet, Take 4 mg by mouth daily as needed for allergies. (Patient not taking: Reported on 04/03/2024), Disp: , Rfl:    Cholecalciferol  (VITAMIN D ) 2000 units CAPS, Take 2,000 Units by mouth daily. , Disp: , Rfl:    diclofenac Sodium (VOLTAREN) 1 % GEL, Apply 4 g topically 3 (three) times daily., Disp: , Rfl:    famotidine (PEPCID) 40 MG tablet, Take 40 mg by mouth 2 (two) times daily., Disp: , Rfl:    ferrous sulfate  325 (65 FE) MG tablet, Take 325 mg by mouth daily with breakfast., Disp: , Rfl:    fluticasone  (FLONASE ) 50 MCG/ACT nasal spray, Place 1 spray into both nostrils 2 (two) times daily. (Patient not taking: Reported  on 04/03/2024), Disp: , Rfl:    ipratropium (ATROVENT) 0.06 % nasal spray, Place 2 sprays into both nostrils 2 (two) times daily., Disp: , Rfl:    ketotifen (ZADITOR) 0.035 % ophthalmic solution, Place 1 drop into both eyes 2 (two) times daily., Disp: , Rfl:    LIVALO 1 MG TABS, Take 1 tablet by mouth daily. (Patient not taking: Reported on 04/03/2024), Disp: , Rfl:    loratadine (CLARITIN) 10 MG tablet, Take 1 tablet by mouth daily as needed. (Patient not taking: Reported on 04/03/2024), Disp: , Rfl:    mometasone (ASMANEX) 220 MCG/INH inhaler, Inhale 2 puffs into the lungs at bedtime. , Disp: , Rfl:    omeprazole (PRILOSEC) 40 MG capsule, Take 40 mg by mouth daily. (Patient not taking: Reported on 04/03/2024), Disp: , Rfl:    sildenafil (VIAGRA) 100 MG tablet, Take 100 mg by mouth daily as needed for erectile dysfunction., Disp: , Rfl:    valsartan (DIOVAN) 160 MG tablet, Take 160 mg by mouth daily. , Disp: , Rfl:   Past Medical History: Past Medical History:  Diagnosis Date   Asthma    followed by [facility]   Benign localized prostatic hyperplasia with lower urinary tract symptoms (LUTS)    urologist-- dr Claretta Croft--  s/p urolift twice in 2019   Chronic constipation    Chronic dryness of both eyes    Elevated PSA    Foley catheter in  place    GERD (gastroesophageal reflux disease)    Gross hematuria    History of prostatitis    Hyperlipidemia    Hypertension    followed by pcp  (11-01-2019 per pt had nuclear stress test done @[facility]  approx. 2018, told is was normal)   IDA (iron deficiency anemia)    OSA on CPAP    uses nightly   Wears glasses    Wears hearing aid in both ears     Tobacco Use: Social History   Tobacco Use  Smoking Status Former   Current packs/day: 0.00   Types: Cigarettes   Start date: 09/06/1976   Quit date: 09/06/1986   Years since quitting: 37.6  Smokeless Tobacco Never    Labs: Review Flowsheet       Latest Ref Rng & Units 11/04/2019  Labs  for ITP Cardiac and Pulmonary Rehab  TCO2 22 - 32 mmol/L 24      Pulmonary Assessment Scores:  Pulmonary Assessment Scores     Row Name 04/08/24 1021         ADL UCSD   ADL Phase Entry     SOB Score total 64     Rest 2     Walk 3     Stairs 4     Bath 2     Dress 2     Shop 2       CAT Score   CAT Score 22       mMRC Score   mMRC Score 1              UCSD: Self-administered rating of dyspnea associated with activities of daily living (ADLs) 6-point scale (0 = "not at all" to 5 = "maximal or unable to do because of breathlessness")  Scoring Scores range from 0 to 120.  Minimally important difference is 5 units  CAT: CAT can identify the health impairment of COPD patients and is better correlated with disease progression.  CAT has a scoring range of zero to 40. The CAT score is classified into four groups of low (less than 10), medium (10 - 20), high (21-30) and very high (31-40) based on the impact level of disease on health status. A CAT score over 10 suggests significant symptoms.  A worsening CAT score could be explained by an exacerbation, poor medication adherence, poor inhaler technique, or progression of COPD or comorbid conditions.  CAT MCID is 2 points  mMRC: mMRC (Modified Medical Research Council) Dyspnea Scale is used to assess the degree of baseline functional disability in patients of respiratory disease due to dyspnea. No minimal important difference is established. A decrease in score of 1 point or greater is considered a positive change.   Pulmonary Function Assessment:   Exercise Target Goals: Exercise Program Goal: Individual exercise prescription set using results from initial 6 min walk test and THRR while considering  patient's activity barriers and safety.   Exercise Prescription Goal: Initial exercise prescription builds to 30-45 minutes a day of aerobic activity, 2-3 days per week.  Home exercise guidelines will be given to patient during  program as part of exercise prescription that the participant will acknowledge.  Education: Aerobic Exercise: - Group verbal and visual presentation on the components of exercise prescription. Introduces F.I.T.T principle from ACSM for exercise prescriptions.  Reviews F.I.T.T. principles of aerobic exercise including progression. Written material given at graduation. Flowsheet Row Pulmonary Rehab from 04/17/2024 in [facility] Cardiac and Pulmonary Rehab  Date 04/17/24  Educator  KH  Instruction Review Code 1- Verbalizes Understanding       Education: Resistance Exercise: - Group verbal and visual presentation on the components of exercise prescription. Introduces F.I.T.T principle from ACSM for exercise prescriptions  Reviews F.I.T.T. principles of resistance exercise including progression. Written material given at graduation. Flowsheet Row Pulmonary Rehab from 04/17/2024 in [facility] Cardiac and Pulmonary Rehab  Date 04/10/24  Educator [facility]  Instruction Review Code 1- [employer] Understanding        Education: Exercise & Equipment Safety: - Individual verbal instruction and demonstration of equipment use and safety with use of the equipment. Flowsheet Row Pulmonary Rehab from 04/17/2024 in [facility] Cardiac and Pulmonary Rehab  Date 04/08/24  Educator NT  Instruction Review Code 1- Verbalizes Understanding       Education: Exercise Physiology & General Exercise Guidelines: - Group verbal and written instruction with models to review the exercise physiology of the cardiovascular system and associated critical values. Provides general exercise guidelines with specific guidelines to those with heart or lung disease.    Education: Flexibility, Balance, Mind/Body Relaxation: - Group verbal and visual presentation with interactive activity on the components of exercise prescription. Introduces F.I.T.T principle from ACSM for exercise prescriptions. Reviews F.I.T.T. principles of flexibility and balance  exercise training including progression. Also discusses the mind body connection.  Reviews various relaxation techniques to help reduce and manage stress (i.e. Deep breathing, progressive muscle relaxation, and visualization). Balance handout provided to take home. Written material given at graduation. Flowsheet Row Pulmonary Rehab from 04/17/2024 in [facility] Cardiac and Pulmonary Rehab  Date 04/10/24  Educator [facility]  Instruction Review Code 1- Verbalizes Understanding       Activity Barriers & Risk Stratification:  Activity Barriers & Cardiac Risk Stratification - 04/03/24 1015       Activity Barriers & Cardiac Risk Stratification   Activity Barriers Joint Problems;Back Problems;Balance Concerns;Muscular Weakness   right ankle replacement            6 Minute Walk:  6 Minute Walk     Row Name 04/08/24 1032         6 Minute Walk   Phase Initial     Distance 1165 feet     Walk Time 6 minutes     # of Rest Breaks 0     MPH 2.21     METS 2.11     RPE 12     Perceived Dyspnea  2     VO2 Peak 7.38     Symptoms No     Resting HR 55 bpm     Resting BP 112/58     Resting Oxygen Saturation  99 %     Exercise Oxygen Saturation  during 6 min walk 97 %     Max Ex. HR 88 bpm     Max Ex. BP 148/62     2 Minute Post BP 122/58       Interval HR   1 Minute HR 78     2 Minute HR 79     3 Minute HR 82     4 Minute HR 84     5 Minute HR 88     6 Minute HR 87     2 Minute Post HR 55     Interval Heart Rate? Yes       Interval Oxygen   Interval Oxygen? Yes     Baseline Oxygen Saturation % 99 %     1 Minute Oxygen Saturation %  98 %     1 Minute Liters of Oxygen 0 L  RA     2 Minute Oxygen Saturation % 98 %     2 Minute Liters of Oxygen 0 L     3 Minute Oxygen Saturation % 98 %     3 Minute Liters of Oxygen 0 L     4 Minute Oxygen Saturation % 97 %     4 Minute Liters of Oxygen 0 L     5 Minute Oxygen Saturation % 98 %     5 Minute Liters of Oxygen 0 L     6 Minute Oxygen  Saturation % 99 %     6 Minute Liters of Oxygen 0 L     2 Minute Post Oxygen Saturation % 99 %     2 Minute Post Liters of Oxygen 0 L             Oxygen Initial Assessment:  Oxygen Initial Assessment - 04/03/24 1017       Home Oxygen   Home Oxygen Device None    Sleep Oxygen Prescription CPAP    Home Exercise Oxygen Prescription None    Home Resting Oxygen Prescription None    Compliance with Home Oxygen Use Yes      Intervention   Short Term Goals To learn and understand importance of monitoring SPO2 with pulse oximeter and demonstrate accurate use of the pulse oximeter.;To learn and understand importance of maintaining oxygen saturations>88%;To learn and demonstrate proper pursed lip breathing techniques or other breathing techniques. ;To learn and demonstrate proper use of respiratory medications    Long  Term Goals Verbalizes importance of monitoring SPO2 with pulse oximeter and return demonstration;Maintenance of O2 saturations>88%;Exhibits proper breathing techniques, such as pursed lip breathing or other method taught during program session;Compliance with respiratory medication;Demonstrates proper use of MDI's             Oxygen Re-Evaluation:   Oxygen Discharge (Final Oxygen Re-Evaluation):   Initial Exercise Prescription:  Initial Exercise Prescription - 04/08/24 1000       Date of Initial Exercise RX and Referring Provider   Date 04/08/24    Referring Provider Dr. Artie Laster, MD      Oxygen   Maintain Oxygen Saturation 88% or higher      Treadmill   MPH 2.5    Grade 0    Minutes 15    METs 2.91      NuStep   Level 2   T6 nustep   SPM 80    Minutes 15    METs 2.11      REL-XR   Level 2    Speed 50    Minutes 15    METs 2.11      Prescription Details   Frequency (times per week) 2    Duration Progress to 30 minutes of continuous aerobic without signs/symptoms of physical distress      Intensity   THRR 40-80% of Max Heartrate 89-123     Ratings of Perceived Exertion 11-13    Perceived Dyspnea 0-4      Progression   Progression Continue to progress workloads to maintain intensity without signs/symptoms of physical distress.      Resistance Training   Training Prescription Yes    Weight 8 lb    Reps 10-15             Perform Capillary Blood Glucose checks as needed.  Exercise Prescription Changes:  Exercise Prescription Changes     Row Name 04/08/24 1000             Response to Exercise   Blood Pressure (Admit) 112/58       Blood Pressure (Exercise) 148/62       Blood Pressure (Exit) 122/58       Heart Rate (Admit) 55 bpm       Heart Rate (Exercise) 88 bpm       Heart Rate (Exit) 55 bpm       Oxygen Saturation (Admit) 99 %       Oxygen Saturation (Exercise) 97 %       Oxygen Saturation (Exit) 99 %       Rating of Perceived Exertion (Exercise) 12       Perceived Dyspnea (Exercise) 2       Symptoms none       Comments Results                Exercise Comments:   Exercise Comments     Row Name 04/10/24 1351           Exercise Comments First full day of exercise!  Patient was oriented to gym and equipment including functions, settings, policies, and procedures.  Patient's individual exercise prescription and treatment plan were reviewed.  All starting workloads were established based on the results of the 6 minute walk test done at initial orientation visit.  The plan for exercise progression was also introduced and progression will be customized based on patient's performance and goals.                Exercise Goals and Review:   Exercise Goals     Row Name 04/08/24 1030             Exercise Goals   Increase Physical Activity Yes       Intervention Provide advice, education, support and counseling about physical activity/exercise needs.;Develop an individualized exercise prescription for aerobic and resistive training based on initial evaluation findings, risk  stratification, comorbidities and participant's personal goals.       Expected Outcomes Short Term: Attend rehab on a regular basis to increase amount of physical activity.;Long Term: Add in home exercise to make exercise part of routine and to increase amount of physical activity.;Long Term: Exercising regularly at least 3-5 days a week.       Increase Strength and Stamina Yes       Intervention Develop an individualized exercise prescription for aerobic and resistive training based on initial evaluation findings, risk stratification, comorbidities and participant's personal goals.;Provide advice, education, support and counseling about physical activity/exercise needs.       Expected Outcomes Short Term: Increase workloads from initial exercise prescription for resistance, speed, and METs.;Short Term: Perform resistance training exercises routinely during rehab and add in resistance training at home;Long Term: Improve cardiorespiratory fitness, muscular endurance and strength as measured by increased METs and functional capacity ( )       Able to understand and use rate of perceived exertion (RPE) scale Yes       Intervention Provide education and explanation on how to use RPE scale       Expected Outcomes Short Term: Able to use RPE daily in rehab to express subjective intensity level;Long Term:  Able to use RPE to guide intensity level when exercising independently       Able to understand and use Dyspnea scale Yes       Intervention  Provide education and explanation on how to use Dyspnea scale       Expected Outcomes Short Term: Able to use Dyspnea scale daily in rehab to express subjective sense of shortness of breath during exertion;Long Term: Able to use Dyspnea scale to guide intensity level when exercising independently       Knowledge and understanding of Target Heart Rate Range (THRR) Yes       Intervention Provide education and explanation of THRR including how the numbers were predicted  and where they are located for reference       Expected Outcomes Short Term: Able to state/look up THRR;Long Term: Able to use THRR to govern intensity when exercising independently;Short Term: Able to use daily as guideline for intensity in rehab       Able to check pulse independently Yes       Intervention Provide education and demonstration on how to check pulse in carotid and radial arteries.;Review the importance of being able to check your own pulse for safety during independent exercise       Expected Outcomes Long Term: Able to check pulse independently and accurately;Short Term: Able to explain why pulse checking is important during independent exercise       Understanding of Exercise Prescription Yes       Intervention Provide education, explanation, and written materials on patient's individual exercise prescription       Expected Outcomes Short Term: Able to explain program exercise prescription;Long Term: Able to explain home exercise prescription to exercise independently                Exercise Goals Re-Evaluation :  Exercise Goals Re-Evaluation     Row Name 04/10/24 1352             Exercise Goal Re-Evaluation   Exercise Goals Review Increase Physical Activity;Knowledge and understanding of Target Heart Rate Range (THRR);Able to understand and use rate of perceived exertion (RPE) scale;Understanding of Exercise Prescription;Increase Strength and Stamina;Able to check pulse independently       Comments Reviewed RPE and dyspnea scale, THR and program prescription with pt today.  Pt voiced understanding and was given a copy of goals to take home.       Expected Outcomes Short: Use RPE daily to regulate intensity.  Long: Follow program prescription in THR.                Discharge Exercise Prescription (Final Exercise Prescription Changes):  Exercise Prescription Changes - 04/08/24 1000       Response to Exercise   Blood Pressure (Admit) 112/58    Blood Pressure  (Exercise) 148/62    Blood Pressure (Exit) 122/58    Heart Rate (Admit) 55 bpm    Heart Rate (Exercise) 88 bpm    Heart Rate (Exit) 55 bpm    Oxygen Saturation (Admit) 99 %    Oxygen Saturation (Exercise) 97 %    Oxygen Saturation (Exit) 99 %    Rating of Perceived Exertion (Exercise) 12    Perceived Dyspnea (Exercise) 2    Symptoms none    Comments Results             Nutrition:  Target Goals: Understanding of nutrition guidelines, daily intake of sodium 1500mg , cholesterol 200mg , calories 30% from fat and 7% or less from saturated fats, daily to have 5 or more servings of fruits and vegetables.  Education: All About Nutrition: -Group instruction provided by verbal, written material, interactive activities, discussions, models, and  posters to present general guidelines for heart healthy nutrition including fat, fiber, MyPlate, the role of sodium in heart healthy nutrition, utilization of the nutrition label, and utilization of this knowledge for meal planning. Follow up email sent as well. Written material given at graduation.   Biometrics:  Pre Biometrics - 04/08/24 1031       Pre Biometrics   Height 6' 0.75" (1.848 m)    Weight 217 lb 3.2 oz (98.5 kg)    Waist Circumference 39.5 inches    Hip Circumference 43 inches    Waist to Hip Ratio 0.92 %    BMI (Calculated) 28.85    Single Leg Stand 2.9 seconds              Nutrition Therapy Plan and Nutrition Goals:  Nutrition Therapy & Goals - 04/08/24 1015       Nutrition Therapy   Diet Mediterranean    Protein (specify units) 70-90g    Fiber 30 grams    Whole Grain Foods 3 servings    Saturated Fats 15 max. grams    Fruits and Vegetables 5 servings/day    Sodium 2 grams      Personal Nutrition Goals   Nutrition Goal Eat 15-30gProtein and 30-60gCarbs at each meal.    Personal Goal #2 Eat 3 times per day, small frequent meals or nutrient dense snacks    Comments Patient drinking ~48oz of water  daily. He  reports he usually eats 3 meals per day. Sometimes will miss lunch if he busy doing something. Encouraged him to have a snack if he thinks he might miss a meal. He agrees he should do that. Brainstormed several meal and snack ideas focusing on balancing carbs with protein or healthy fats. Provided mediterranean diet handout. Educated on the importance of meeting his nutritional goals with COPD.      Intervention Plan   Intervention Prescribe, educate and counsel regarding individualized specific dietary modifications aiming towards targeted core components such as weight, hypertension, lipid management, diabetes, heart failure and other comorbidities.;Nutrition handout(s) given to patient.    Expected Outcomes Long Term Goal: Adherence to prescribed nutrition plan.;Short Term Goal: A plan has been developed with personal nutrition goals set during dietitian appointment.;Short Term Goal: Understand basic principles of dietary content, such as calories, fat, sodium, cholesterol and nutrients.             Nutrition Assessments:  MEDIFICTS Score Key: >=70 Need to make dietary changes  40-70 Heart Healthy Diet <= 40 Therapeutic Level Cholesterol Diet  Flowsheet Row Pulmonary Rehab from 04/08/2024 in [facility] Cardiac and Pulmonary Rehab  Picture Your Plate Total Score on Admission 75      Picture Your Plate Scores: <16 Unhealthy dietary pattern with much room for improvement. 41-50 Dietary pattern unlikely to meet recommendations for good health and room for improvement. 51-60 More healthful dietary pattern, with some room for improvement.  >60 Healthy dietary pattern, although there may be some specific behaviors that could be improved.   Nutrition Goals Re-Evaluation:   Nutrition Goals Discharge (Final Nutrition Goals Re-Evaluation):   Psychosocial: Target Goals: Acknowledge presence or absence of significant depression and/or stress, maximize coping skills, provide positive support  system. Participant is able to verbalize types and ability to use techniques and skills needed for reducing stress and depression.   Education: Stress, Anxiety, and Depression - Group verbal and visual presentation to define topics covered.  Reviews how body is impacted by stress, anxiety, and depression.  Also discusses healthy  ways to reduce stress and to treat/manage anxiety and depression.  Written material given at graduation.   Education: Sleep Hygiene -Provides group verbal and written instruction about how sleep can affect your health.  Define sleep hygiene, discuss sleep cycles and impact of sleep habits. Review good sleep hygiene tips.    Initial Review & Psychosocial Screening:  Initial Psych Review & Screening - 04/03/24 1021       Initial Review   Current issues with History of Depression      Family Dynamics   Good Support System? Yes   family, friends     Barriers   Psychosocial barriers to participate in program There are no identifiable barriers or psychosocial needs.;The patient should benefit from training in stress management and relaxation.      Screening Interventions   Interventions Encouraged to exercise    Expected Outcomes Short Term goal: Utilizing psychosocial counselor, staff and physician to assist with identification of specific Stressors or current issues interfering with healing process. Setting desired goal for each stressor or current issue identified.;Long Term Goal: Stressors or current issues are controlled or eliminated.;Short Term goal: Identification and review with participant of any Quality of Life or Depression concerns found by scoring the questionnaire.;Long Term goal: The participant improves quality of Life and PHQ9 Scores as seen by post scores and/or verbalization of changes             Quality of Life Scores:  Scores of 19 and below usually indicate a poorer quality of life in these areas.  A difference of  2-3 points is a  clinically meaningful difference.  A difference of 2-3 points in the total score of the Quality of Life Index has been associated with significant improvement in overall quality of life, self-image, physical symptoms, and general health in studies assessing change in quality of life.  PHQ-9: Review Flowsheet       04/08/2024  Depression screen PHQ 2/9  Decreased Interest 1  Down, Depressed, Hopeless 2  PHQ - 2 Score 3  Altered sleeping 0  Tired, decreased energy 1  Change in appetite 2  Feeling bad or failure about yourself  0  Trouble concentrating 1  Moving slowly or fidgety/restless 1  Suicidal thoughts 0  PHQ-9 Score 8  Difficult doing work/chores Somewhat difficult   Interpretation of Total Score  Total Score Depression Severity:  1-4 = Minimal depression, 5-9 = Mild depression, 10-14 = Moderate depression, 15-19 = Moderately severe depression, 20-27 = Severe depression   Psychosocial Evaluation and Intervention:  Psychosocial Evaluation - 04/03/24 1024       Psychosocial Evaluation & Interventions   Interventions Encouraged to exercise with the program and follow exercise prescription    Comments Mr. Lasch is coming to Pulmonary Rehab with COPD. He states he has no concerns currently with stress or anxiety/depression, but does have a history of PTSD. He has a great support system and feels like he is managing his health well. He is interested in learning more about risk factor management and increasing his stamina while in the program.    Expected Outcomes Short: attend pulmonary rehab for exercise and education. Long; develop and maintain positive self care habits    Continue Psychosocial Services  Follow up required by staff             Psychosocial Re-Evaluation:   Psychosocial Discharge (Final Psychosocial Re-Evaluation):   Education: Education Goals: Education classes will be provided on a weekly basis, covering required topics.  Participant will state  understanding/return demonstration of topics presented.  Learning Barriers/Preferences:  Learning Barriers/Preferences - 04/03/24 1020       Learning Barriers/Preferences   Learning Barriers None    Learning Preferences None             General Pulmonary Education Topics:  Infection Prevention: - Provides verbal and written material to individual with discussion of infection control including proper hand washing and proper equipment cleaning during exercise session. Flowsheet Row Pulmonary Rehab from 04/17/2024 in [facility] Cardiac and Pulmonary Rehab  Date 04/08/24  Educator NT  Instruction Review Code 1- Verbalizes Understanding       Falls Prevention: - Provides verbal and written material to individual with discussion of falls prevention and safety. Flowsheet Row Pulmonary Rehab from 04/17/2024 in [facility] Cardiac and Pulmonary Rehab  Date 04/08/24  Educator NT  Instruction Review Code 1- Verbalizes Understanding       Chronic Lung Disease Review: - Group verbal instruction with posters, models, PowerPoint presentations and videos,  to review new updates, new respiratory medications, new advancements in procedures and treatments. Providing information on websites and "800" numbers for continued self-education. Includes information about supplement oxygen, available portable oxygen systems, continuous and intermittent flow rates, oxygen safety, concentrators, and Medicare reimbursement for oxygen. Explanation of Pulmonary Drugs, including class, frequency, complications, importance of spacers, rinsing mouth after steroid MDI's, and proper cleaning methods for nebulizers. Review of basic lung anatomy and physiology related to function, structure, and complications of lung disease. Review of risk factors. Discussion about methods for diagnosing sleep apnea and types of masks and machines for OSA. Includes a review of the use of types of environmental controls: home humidity, furnaces,  filters, dust mite/pet prevention, HEPA vacuums. Discussion about weather changes, air quality and the benefits of nasal washing. Instruction on Warning signs, infection symptoms, calling MD promptly, preventive modes, and value of vaccinations. Review of effective airway clearance, coughing and/or vibration techniques. Emphasizing that all should Create an Action Plan. Written material given at graduation. Flowsheet Row Pulmonary Rehab from 04/17/2024 in [facility] Cardiac and Pulmonary Rehab  Education need identified 04/08/24       AED/CPR: - Group verbal and written instruction with the use of models to demonstrate the basic use of the AED with the basic ABC's of resuscitation.    Anatomy and Cardiac Procedures: - Group verbal and visual presentation and models provide information about basic cardiac anatomy and function. Reviews the testing methods done to diagnose heart disease and the outcomes of the test results. Describes the treatment choices: Medical Management, Angioplasty, or Coronary Bypass Surgery for treating various heart conditions including Myocardial Infarction, Angina, Valve Disease, and Cardiac Arrhythmias.  Written material given at graduation.   Medication Safety: - Group verbal and visual instruction to review commonly prescribed medications for heart and lung disease. Reviews the medication, class of the drug, and side effects. Includes the steps to properly store meds and maintain the prescription regimen.  Written material given at graduation.   Other: -Provides group and verbal instruction on various topics (see comments)   Knowledge Questionnaire Score:  Knowledge Questionnaire Score - 04/08/24 1020       Knowledge Questionnaire Score   Pre Score 15/18              Core Components/Risk Factors/Patient Goals at Admission:  Personal Goals and Risk Factors at Admission - 04/03/24 1019       Core Components/Risk Factors/Patient Goals on Admission    Weight  Management Yes;Weight Maintenance    Intervention Weight Management: Develop a combined nutrition and exercise program designed to reach desired caloric intake, while maintaining appropriate intake of nutrient and fiber, sodium and fats, and appropriate energy expenditure required for the weight goal.;Weight Management: Provide education and appropriate resources to help participant work on and attain dietary goals.;Weight Management/Obesity: Establish reasonable short term and long term weight goals.    Expected Outcomes Weight Maintenance: Understanding of the daily nutrition guidelines, which includes 25-35% calories from fat, 7% or less cal from saturated fats, less than 200mg  cholesterol, less than 1.5gm of sodium, & 5 or more servings of fruits and vegetables daily;Long Term: Adherence to nutrition and physical activity/exercise program aimed toward attainment of established weight goal;Short Term: Continue to assess and modify interventions until short term weight is achieved    Improve shortness of breath with ADL's Yes    Intervention Provide education, individualized exercise plan and daily activity instruction to help decrease symptoms of SOB with activities of daily living.    Expected Outcomes Short Term: Improve cardiorespiratory fitness to achieve a reduction of symptoms when performing ADLs;Long Term: Be able to perform more ADLs without symptoms or delay the onset of symptoms    Hypertension Yes    Intervention Provide education on lifestyle modifcations including regular physical activity/exercise, weight management, moderate sodium restriction and increased consumption of fresh fruit, vegetables, and low fat dairy, alcohol  moderation, and smoking cessation.;Monitor prescription use compliance.    Expected Outcomes Short Term: Continued assessment and intervention until BP is < 140/32mm HG in hypertensive participants. < 130/41mm HG in hypertensive participants with diabetes, heart failure  or chronic kidney disease.;Long Term: Maintenance of blood pressure at goal levels.    Lipids Yes    Intervention Provide education and support for participant on nutrition & aerobic/resistive exercise along with prescribed medications to achieve LDL 70mg , HDL >40mg .    Expected Outcomes Short Term: Participant states understanding of desired cholesterol values and is compliant with medications prescribed. Participant is following exercise prescription and nutrition guidelines.;Long Term: Cholesterol controlled with medications as prescribed, with individualized exercise RX and with personalized nutrition plan. Value goals: LDL < 70mg , HDL > 40 mg.             Education:Diabetes - Individual verbal and written instruction to review signs/symptoms of diabetes, desired ranges of glucose level fasting, after meals and with exercise. Acknowledge that pre and post exercise glucose checks will be done for 3 sessions at entry of program.   Know Your Numbers and Heart Failure: - Group verbal and visual instruction to discuss disease risk factors for cardiac and pulmonary disease and treatment options.  Reviews associated critical values for Overweight/Obesity, Hypertension, Cholesterol, and Diabetes.  Discusses basics of heart failure: signs/symptoms and treatments.  Introduces Heart Failure Zone chart for action plan for heart failure.  Written material given at graduation.   Core Components/Risk Factors/Patient Goals Review:    Core Components/Risk Factors/Patient Goals at Discharge (Final Review):    ITP Comments:  ITP Comments     Row Name 04/03/24 1026 04/08/24 1028 04/10/24 1351 04/24/24 1114     ITP Comments Initial phone call completed. Diagnosis can be found in [facility] 5/7. EP Orientation scheduled for Monday 5/19 at 9am. Completed and gym orientation for pulmonary rehab. Initial ITP created and sent for review to Dr. Faud Aleskerov, Medical Director. First full day of exercise!   Patient was oriented to gym and equipment including functions, settings, policies, and procedures.  Patient's individual exercise prescription and treatment plan were reviewed.  All starting workloads were established based on the results of the 6 minute walk test done at initial orientation visit.  The plan for exercise progression was also introduced and progression will be customized based on patient's performance and goals. 30 Day review completed. Medical Director ITP review done, changes made as directed, and signed approval by Medical Director.    new to program             Comments:

## 2024-04-24 NOTE — Progress Notes (Signed)
 Daily Session Note  Patient Details  Name: Wesley Weber MRN: [id number] Date of Birth: [birth year] Referring Provider:   Gattis Kass Pulmonary Rehab from 04/08/2024 in [facility] Cardiac and Pulmonary Rehab  Referring Provider Dr. Artie Laster, MD       Encounter Date: 04/24/2024  Check In:  Session Check In - 04/24/24 1456       Check-In   Supervising physician immediately available to respond to emergencies See telemetry face sheet for immediately available ER MD    Location [facility]-Cardiac & Pulmonary Rehab    Staff Present Maud Sorenson, RN, BSN, CCRP;Kelly Hayes BS, ACSM CEP, Exercise Physiologist;Noah Tickle, BS, Exercise Physiologist;Jason Martina Sledge RDN,LDN;Kelly Bollinger [facility]    Virtual Visit No    Medication changes reported     No    Fall or balance concerns reported    No    Warm-up and Cool-down Performed on first and last piece of equipment    Resistance Training Performed Yes    VAD Patient? No    PAD/SET Patient? No      Pain Assessment   Currently in Pain? No/denies                Social History   Tobacco Use  Smoking Status Former   Current packs/day: 0.00   Types: Cigarettes   Start date: 09/06/1976   Quit date: 09/06/1986   Years since quitting: 37.6  Smokeless Tobacco Never    Goals Met:  Proper associated with RPD/PD & O2 Sat Independence with exercise equipment Exercise tolerated well No report of concerns or symptoms today  Goals Unmet:  Not Applicable  Comments: Pt able to follow exercise prescription today without complaint.  Will continue to monitor for progression.    Dr. Firman Hughes is Medical Director for [facility] Cardiac Rehabilitation.  Dr. Fuad Aleskerov is Medical Director for [facility] Pulmonary Rehabilitation.

## 2024-04-25 ENCOUNTER — Encounter: Admitting: *Deleted

## 2024-04-25 DIAGNOSIS — J449 Chronic obstructive pulmonary disease, unspecified: Secondary | ICD-10-CM

## 2024-04-25 NOTE — Progress Notes (Signed)
 Daily Session Note  Patient Details  Name: Wesley Weber MRN: [id number] Date of Birth: 05/13/1946 Referring Provider:   Gattis Kass Pulmonary Rehab from 04/08/2024 in [facility] Cardiac and Pulmonary Rehab  Referring Provider Dr. Artie Laster, MD       Encounter Date: 04/25/2024  Check In:  Session Check In - 04/25/24 1420       Check-In   Supervising physician immediately available to respond to emergencies See telemetry face sheet for immediately available ER MD    Location [facility]-Cardiac & Pulmonary Rehab    Staff Present Maud Sorenson, RN, BSN, CCRP;Joseph Hood RCP,RRT,BSRT;Maxon [place] BS, Exercise Physiologist;Noah Tickle, BS, Exercise Physiologist    Virtual Visit No    Medication changes reported     No    Fall or balance concerns reported    No    Warm-up and Cool-down Performed on first and last piece of equipment    Resistance Training Performed Yes    VAD Patient? No    PAD/SET Patient? No      Pain Assessment   Currently in Pain? No/denies                Social History   Tobacco Use  Smoking Status Former   Current packs/day: 0.00   Types: Cigarettes   Start date: 09/06/1976   Quit date: 09/06/1986   Years since quitting: 37.6  Smokeless Tobacco Never    Goals Met:  Proper associated with RPD/PD & O2 Sat Independence with exercise equipment Exercise tolerated well No report of concerns or symptoms today  Goals Unmet:  Not Applicable  Comments: Pt able to follow exercise prescription today without complaint.  Will continue to monitor for progression.    Dr. Firman Hughes is Medical Director for [facility] Cardiac Rehabilitation.  Dr. Fuad Aleskerov is Medical Director for [facility] Pulmonary Rehabilitation.

## 2024-05-01 ENCOUNTER — Encounter: Admitting: *Deleted

## 2024-05-01 DIAGNOSIS — J449 Chronic obstructive pulmonary disease, unspecified: Secondary | ICD-10-CM

## 2024-05-01 NOTE — Progress Notes (Signed)
 Daily Session Note  Patient Details  Name: Wesley Weber MRN: [id number] Date of Birth: Feb 18, 1946 Referring Provider:   Gattis Kass Pulmonary Rehab from 04/08/2024 in [facility] Cardiac and Pulmonary Rehab  Referring Provider Dr. Artie Laster, MD       Encounter Date: 05/01/2024  Check In:  Session Check In - 05/01/24 1340       Check-In   Supervising physician immediately available to respond to emergencies See telemetry face sheet for immediately available ER MD    Location [facility]-Cardiac & Pulmonary Rehab    Staff Present Josph Nimrod RN,BSN,MPA;Jeremie Giangrande Manson Seitz RN,BSN;Jason Martina Sledge [facility]    Virtual Visit No    Medication changes reported     No    Fall or balance concerns reported    No    Warm-up and Cool-down Performed on first and last piece of equipment    Resistance Training Performed Yes    VAD Patient? No    PAD/SET Patient? No      Pain Assessment   Currently in Pain? No/denies                Social History   Tobacco Use  Smoking Status Former   Current packs/day: 0.00   Types: Cigarettes   Start date: 09/06/1976   Quit date: 09/06/1986   Years since quitting: 37.6  Smokeless Tobacco Never    Goals Met:  Independence with exercise equipment Exercise tolerated well No report of concerns or symptoms today Strength training completed today  Goals Unmet:  Not Applicable  Comments: Pt able to follow exercise prescription today without complaint.  Will continue to monitor for progression.    Dr. Firman Hughes is Medical Director for [facility] Cardiac Rehabilitation.  Dr. Fuad Aleskerov is Medical Director for [facility] Pulmonary Rehabilitation.

## 2024-05-02 ENCOUNTER — Encounter: Admitting: *Deleted

## 2024-05-02 DIAGNOSIS — J449 Chronic obstructive pulmonary disease, unspecified: Secondary | ICD-10-CM | POA: Diagnosis not present

## 2024-05-02 NOTE — Progress Notes (Signed)
 Daily Session Note  Patient Details  Name: Wesley Weber MRN: [id number] Date of Birth: 1946-03-16 Referring Provider:   Gattis Kass Pulmonary Rehab from 04/08/2024 in [facility] Cardiac and Pulmonary Rehab  Referring Provider Dr. Artie Laster, MD    Encounter Date: 05/02/2024  Check In:  Session Check In - 05/02/24 1402       Check-In   Supervising physician immediately available to respond to emergencies See telemetry face sheet for immediately available ER MD    Location [facility]-Cardiac & Pulmonary Rehab    Staff Present Maud Sorenson, RN, BSN, CCRP;Meredith Manson Seitz RN,BSN;Maxon [employer], Exercise Physiologist;Noah Tickle, BS, Exercise Physiologist    Virtual Visit No    Medication changes reported     No    Fall or balance concerns reported    No    Warm-up and Cool-down Performed on first and last piece of equipment    Resistance Training Performed Yes    VAD Patient? No    PAD/SET Patient? No      Pain Assessment   Currently in Pain? No/denies             Social History   Tobacco Use  Smoking Status Former   Current packs/day: 0.00   Types: Cigarettes   Start date: 09/06/1976   Quit date: 09/06/1986   Years since quitting: 37.6  Smokeless Tobacco Never    Goals Met:  Proper associated with RPD/PD & O2 Sat Independence with exercise equipment Exercise tolerated well No report of concerns or symptoms today  Goals Unmet:  Not Applicable  Comments: Pt able to follow exercise prescription today without complaint.  Will continue to monitor for progression.    Dr. Firman Hughes is Medical Director for [facility] Cardiac Rehabilitation.  Dr. Fuad Aleskerov is Medical Director for [facility] Pulmonary Rehabilitation.

## 2024-05-08 ENCOUNTER — Encounter: Admitting: *Deleted

## 2024-05-08 DIAGNOSIS — J449 Chronic obstructive pulmonary disease, unspecified: Secondary | ICD-10-CM | POA: Diagnosis not present

## 2024-05-08 NOTE — Progress Notes (Signed)
 Daily Session Note  Patient Details  Name: Wesley Weber MRN: [id number] Date of Birth: 07-24-1946 Referring Provider:   Gattis Kass Pulmonary Rehab from 04/08/2024 in [facility] Cardiac and Pulmonary Rehab  Referring Provider Dr. Artie Laster, MD    Encounter Date: 05/08/2024  Check In:  Session Check In - 05/08/24 1420       Check-In   Supervising physician immediately available to respond to emergencies See telemetry face sheet for immediately available ER MD    Location [facility]-Cardiac & Pulmonary Rehab    Staff Present Sue Em RN,BSN;Susanne Bice, RN, BSN, CCRP;Jason Martina Sledge RDN,LDN;Noah Tickle, BS, Exercise Physiologist    Virtual Visit No    Medication changes reported     No    Fall or balance concerns reported    No    Warm-up and Cool-down Performed on first and last piece of equipment    Resistance Training Performed Yes    VAD Patient? No    PAD/SET Patient? No      Pain Assessment   Currently in Pain? No/denies             Social History   Tobacco Use  Smoking Status Former   Current packs/day: 0.00   Types: Cigarettes   Start date: 09/06/1976   Quit date: 09/06/1986   Years since quitting: 37.6  Smokeless Tobacco Never    Goals Met:  Independence with exercise equipment Exercise tolerated well No report of concerns or symptoms today Strength training completed today  Goals Unmet:  Not Applicable  Comments: Pt able to follow exercise prescription today without complaint.  Will continue to monitor for progression.    Dr. Firman Hughes is Medical Director for [facility] Cardiac Rehabilitation.  Dr. Fuad Aleskerov is Medical Director for [facility] Pulmonary Rehabilitation.

## 2024-05-09 ENCOUNTER — Encounter: Admitting: *Deleted

## 2024-05-09 DIAGNOSIS — J449 Chronic obstructive pulmonary disease, unspecified: Secondary | ICD-10-CM

## 2024-05-09 NOTE — Progress Notes (Signed)
 Daily Session Note  Patient Details  Name: Wesley Weber MRN: [id number] Date of Birth: Jul 10, 1946 Referring Provider:   Gattis Kass Pulmonary Rehab from 04/08/2024 in [facility] Cardiac and Pulmonary Rehab  Referring Provider Dr. Artie Laster, MD    Encounter Date: 05/09/2024  Check In:  Session Check In - 05/09/24 1356       Check-In   Supervising physician immediately available to respond to emergencies See telemetry face sheet for immediately available ER MD    Location [facility]-Cardiac & Pulmonary Rehab    Staff Present Sue Em RN,BSN;Joseph Neldon [place], [place], RRT, CPFT;Noah Tickle, BS, Exercise Physiologist    Virtual Visit No    Medication changes reported     No    Fall or balance concerns reported    No    Warm-up and Cool-down Performed on first and last piece of equipment    Resistance Training Performed Yes    VAD Patient? No    PAD/SET Patient? No      Pain Assessment   Currently in Pain? No/denies             Social History   Tobacco Use  Smoking Status Former   Current packs/day: 0.00   Types: Cigarettes   Start date: 09/06/1976   Quit date: 09/06/1986   Years since quitting: 37.6  Smokeless Tobacco Never    Goals Met:  Independence with exercise equipment Exercise tolerated well No report of concerns or symptoms today Strength training completed today  Goals Unmet:  Not Applicable  Comments: Pt able to follow exercise prescription today without complaint.  Will continue to monitor for progression.    Dr. Firman Hughes is Medical Director for [facility] Cardiac Rehabilitation.  Dr. Fuad Aleskerov is Medical Director for [facility] Pulmonary Rehabilitation.

## 2024-05-15 ENCOUNTER — Encounter

## 2024-05-15 DIAGNOSIS — J449 Chronic obstructive pulmonary disease, unspecified: Secondary | ICD-10-CM | POA: Diagnosis not present

## 2024-05-15 NOTE — Progress Notes (Signed)
 Daily Session Note  Patient Details  Name: Wesley Weber MRN: [id number] Date of Birth: [birth year] Referring Provider:   [name] Pulmonary Rehab from 04/08/2024 in [facility] Cardiac and Pulmonary Rehab  Referring Provider Dr. Jereld Boos, MD    Encounter Date: 05/15/2024  Check In:  Session Check In - 05/15/24 1357       Check-In   Supervising physician immediately available to respond to emergencies See telemetry face sheet for immediately available ER MD    Location [facility]-Cardiac & Pulmonary Rehab    Staff Present Burnard Davenport RN,BSN,MPA;Maxon [place] BS, Exercise Physiologist;[place] Dyane HECKLE, ACSM CEP, Exercise Physiologist;Jason Elnor RDN,LDN;Noah Tickle, BS, Exercise Physiologist    Virtual Visit No    Medication changes reported     No    Fall or balance concerns reported    No    Warm-up and Cool-down Performed on first and last piece of equipment    Resistance Training Performed Yes    VAD Patient? No    PAD/SET Patient? No      Pain Assessment   Currently in Pain? No/denies             Social History   Tobacco Use  Smoking Status Former   Current packs/day: 0.00   Types: Cigarettes   Start date: 09/06/1976   Quit date: 09/06/1986   Years since quitting: 37.7  Smokeless Tobacco Never    Goals Met:  Independence with exercise equipment Exercise tolerated well No report of concerns or symptoms today Strength training completed today  Goals Unmet:  Not Applicable  Comments: Pt able to follow exercise prescription today without complaint.  Will continue to monitor for progression.    Dr. Oneil Pinal is Medical Director for [facility] Cardiac Rehabilitation.  Dr. Fuad Aleskerov is Medical Director for [facility] Pulmonary Rehabilitation.

## 2024-05-16 ENCOUNTER — Encounter: Admitting: *Deleted

## 2024-05-16 DIAGNOSIS — J449 Chronic obstructive pulmonary disease, unspecified: Secondary | ICD-10-CM

## 2024-05-16 NOTE — Progress Notes (Signed)
 Daily Session Note  Patient Details  Name: Wesley Weber MRN: [id number] Date of Birth: 1946/04/02 Referring Provider:   [name] Pulmonary Rehab from 04/08/2024 in [facility] Cardiac and Pulmonary Rehab  Referring Provider Dr. Jereld Boos, MD    Encounter Date: 05/16/2024  Check In:  Session Check In - 05/16/24 1404       Check-In   Supervising physician immediately available to respond to emergencies See telemetry face sheet for immediately available ER MD    Location [facility]-Cardiac & Pulmonary Rehab    Staff Present Hoy Rodney RN,BSN;Joseph [facility] BS, Exercise Physiologist;Noah Tickle, BS, Exercise Physiologist    Virtual Visit No    Medication changes reported     No    Fall or balance concerns reported    No    Warm-up and Cool-down Performed on first and last piece of equipment    Resistance Training Performed Yes    VAD Patient? No      Pain Assessment   Currently in Pain? No/denies             Social History   Tobacco Use  Smoking Status Former   Current packs/day: 0.00   Types: Cigarettes   Start date: 09/06/1976   Quit date: 09/06/1986   Years since quitting: 37.7  Smokeless Tobacco Never    Goals Met:  Independence with exercise equipment Exercise tolerated well No report of concerns or symptoms today Strength training completed today  Goals Unmet:  Not Applicable  Comments: Pt able to follow exercise prescription today without complaint.  Will continue to monitor for progression.    Dr. Oneil Pinal is Medical Director for [facility] Cardiac Rehabilitation.  Dr. Fuad Aleskerov is Medical Director for [facility] Pulmonary Rehabilitation.

## 2024-05-22 ENCOUNTER — Encounter: Admitting: *Deleted

## 2024-05-22 ENCOUNTER — Encounter: Payer: Self-pay | Admitting: *Deleted

## 2024-05-22 DIAGNOSIS — J449 Chronic obstructive pulmonary disease, unspecified: Secondary | ICD-10-CM | POA: Diagnosis present

## 2024-05-22 NOTE — Progress Notes (Signed)
 Daily Session Note  Patient Details  Name: KALVIN BUSS MRN: [id number] Date of Birth: [birth year] Referring Provider:   [name] from 04/08/2024 in [facility]  Referring Provider Dr. Jereld Boos, MD    Encounter Date: 05/22/2024  Check In:  Session Check In - 05/22/24 1411       Check-In   Supervising physician immediately available to respond to emergencies See telemetry face sheet for immediately available ER MD    Location [facility]    Staff Present Hoy Rodney RN,BSN;Noah Tickle, BS, Exercise Physiologist;Kristen Coble RN,BC,MSN;Kelly Bollinger [facility]    Virtual Visit No    Medication changes reported     No    Fall or balance concerns reported    No    Warm-up and Cool-down Performed on first and last piece of equipment    Resistance Training Performed Yes    VAD Patient? No    PAD/SET Patient? No      Pain Assessment   Currently in Pain? No/denies             Social History   Tobacco Use  Smoking Status Former   Current packs/day: 0.00   Types: Cigarettes   Start date: 09/06/1976   Quit date: 09/06/1986   Years since quitting: 37.7  Smokeless Tobacco Never    Goals Met:  Independence with exercise equipment Exercise tolerated well No report of concerns or symptoms today Strength training completed today  Goals Unmet:  Not Applicable  Comments: Pt able to follow exercise prescription today without complaint.  Will continue to monitor for progression.    Dr. Oneil Pinal is Medical Director for [facility].  Dr. Fuad Aleskerov is Medical Director for [facility].

## 2024-05-22 NOTE — Progress Notes (Signed)
 Pulmonary Individual Treatment Plan  Patient Details  Name: Wesley Weber MRN: [id number] Date of Birth: 08-14-1946 Referring Provider:   [name] Re[facility] from 04/08/2024 in [facility] Cardiac and Pulmonary Rehab  Referring Provider Dr. Jereld Boos, MD    Initial Encounter Date:  Flowsheet Row Pulmonary Rehab from 04/08/2024 in [facility] Cardiac and Pulmonary Rehab  Date 04/08/24    Visit Diagnosis: Chronic obstructive pulmonary disease, unspecified COPD type (HCC)  Patient's Home Medications on Admission:  Current Outpatient Medications:    albuterol  (PROVENTIL  HFA;VENTOLIN  HFA) 108 (90 Base) MCG/ACT inhaler, Inhale 2 puffs into the lungs every 4 (four) hours as needed for wheezing or shortness of breath., Disp: , Rfl:    amLODipine  (NORVASC ) 5 MG tablet, Take 5 mg by mouth daily., Disp: , Rfl:    aspirin  EC 81 MG tablet, Take 81 mg by mouth daily. (Patient not taking: Reported on 04/03/2024), Disp: , Rfl:    azelastine (ASTELIN) 0.1 % nasal spray, Place 1 spray into both nostrils 2 (two) times daily. Use in each nostril as directed (Patient not taking: Reported on 04/03/2024), Disp: , Rfl:    budesonide (RHINOCORT AQUA) 32 MCG/ACT nasal spray, Place 1 spray into both nostrils 2 (two) times daily., Disp: , Rfl:    chlorpheniramine (CHLOR-TRIMETON) 4 MG tablet, Take 4 mg by mouth daily as needed for allergies. (Patient not taking: Reported on 04/03/2024), Disp: , Rfl:    Cholecalciferol  (VITAMIN D ) 2000 units CAPS, Take 2,000 Units by mouth daily. , Disp: , Rfl:    diclofenac Sodium (VOLTAREN) 1 % GEL, Apply 4 g topically 3 (three) times daily., Disp: , Rfl:    famotidine (PEPCID) 40 MG tablet, Take 40 mg by mouth 2 (two) times daily., Disp: , Rfl:    ferrous sulfate  325 (65 FE) MG tablet, Take 325 mg by mouth daily with breakfast., Disp: , Rfl:    fluticasone  (FLONASE ) 50 MCG/ACT nasal spray, Place 1 spray into both nostrils 2 (two) times daily. (Patient not taking: Reported on  04/03/2024), Disp: , Rfl:    ipratropium (ATROVENT) 0.06 % nasal spray, Place 2 sprays into both nostrils 2 (two) times daily., Disp: , Rfl:    ketotifen (ZADITOR) 0.035 % ophthalmic solution, Place 1 drop into both eyes 2 (two) times daily., Disp: , Rfl:    LIVALO 1 MG TABS, Take 1 tablet by mouth daily. (Patient not taking: Reported on 04/03/2024), Disp: , Rfl:    loratadine (CLARITIN) 10 MG tablet, Take 1 tablet by mouth daily as needed. (Patient not taking: Reported on 04/03/2024), Disp: , Rfl:    mometasone (ASMANEX) 220 MCG/INH inhaler, Inhale 2 puffs into the lungs at bedtime. , Disp: , Rfl:    omeprazole (PRILOSEC) 40 MG capsule, Take 40 mg by mouth daily. (Patient not taking: Reported on 04/03/2024), Disp: , Rfl:    sildenafil (VIAGRA) 100 MG tablet, Take 100 mg by mouth daily as needed for erectile dysfunction., Disp: , Rfl:    valsartan (DIOVAN) 160 MG tablet, Take 160 mg by mouth daily. , Disp: , Rfl:   Past Medical History: Past Medical History:  Diagnosis Date   Asthma    followed by [facility]   Benign localized prostatic hyperplasia with lower urinary tract symptoms (LUTS)    urologist-- dr sherrilee--  s/p urolift twice in 2019   Chronic constipation    Chronic dryness of both eyes    Elevated PSA    Foley catheter in place    GERD (gastroesophageal  reflux disease)    Gross hematuria    History of prostatitis    Hyperlipidemia    Hypertension    followed by pcp  (11-01-2019 per pt had nuclear stress test done @[facility]  approx. 2018, told is was normal)   IDA (iron deficiency anemia)    OSA on CPAP    uses nightly   Wears glasses    Wears hearing aid in both ears     Tobacco Use: Social History   Tobacco Use  Smoking Status Former   Current packs/day: 0.00   Types: Cigarettes   Start date: 09/06/1976   Quit date: 09/06/1986   Years since quitting: 37.7  Smokeless Tobacco Never    Labs: Review Flowsheet       Latest Ref Rng & Units 11/04/2019  Labs for  [facility] Cardiac and Pulmonary Rehab  TCO2 22 - 32 mmol/L 24      Pulmonary Assessment Scores:  Pulmonary Assessment Scores     Row Name 04/08/24 1021         ADL UCSD   ADL Phase Entry     SOB Score total 64     Rest 2     Walk 3     Stairs 4     Bath 2     Dress 2     Shop 2       CAT Score   CAT Score 22       mMRC Score   mMRC Score 1        UCSD: Self-administered rating of dyspnea associated with activities of daily living (ADLs) 6-point scale (0 = not at all to 5 = maximal or unable to do because of breathlessness)  Scoring Scores range from 0 to 120.  Minimally important difference is 5 units  CAT: CAT can identify the health impairment of COPD patients and is better correlated with disease progression.  CAT has a scoring range of zero to 40. The CAT score is classified into four groups of low (less than 10), medium (10 - 20), high (21-30) and very high (31-40) based on the impact level of disease on health status. A CAT score over 10 suggests significant symptoms.  A worsening CAT score could be explained by an exacerbation, poor medication adherence, poor inhaler technique, or progression of COPD or comorbid conditions.  CAT MCID is 2 points  mMRC: mMRC (Modified Medical Research Council) Dyspnea Scale is used to assess the degree of baseline functional disability in patients of respiratory disease due to dyspnea. No minimal important difference is established. A decrease in score of 1 point or greater is considered a positive change.   Pulmonary Function Assessment:   Exercise Target Goals: Exercise Program Goal: Individual exercise prescription set using results from initial 6 min walk test and THRR while considering  patient's activity barriers and safety.   Exercise Prescription Goal: Initial exercise prescription builds to 30-45 minutes a day of aerobic activity, 2-3 days per week.  Home exercise guidelines will be given to patient during program as  part of exercise prescription that the participant will acknowledge.  Education: Aerobic Exercise: - Group verbal and visual presentation on the components of exercise prescription. Introduces F.I.T.T principle from ACSM for exercise prescriptions.  Reviews F.I.T.T. principles of aerobic exercise including progression. Written material given at graduation. Flowsheet Row Pulmonary Rehab from 05/08/2024 in [facility] Cardiac and Pulmonary Rehab  Date 04/17/24  Educator [facility]  Instruction Review Code 1- [employer] Understanding    Education:  Resistance Exercise: - Group verbal and visual presentation on the components of exercise prescription. Introduces F.I.T.T principle from ACSM for exercise prescriptions  Reviews F.I.T.T. principles of resistance exercise including progression. Written material given at graduation. Flowsheet Row Pulmonary Rehab from 05/08/2024 in [facility] Cardiac and Pulmonary Rehab  Date 04/10/24  Educator [facility]  Instruction Review Code 1- [employer] Understanding     Education: Exercise & Equipment Safety: - Individual verbal instruction and demonstration of equipment use and safety with use of the equipment. Flowsheet Row Pulmonary Rehab from 05/08/2024 in [facility] Cardiac and Pulmonary Rehab  Date 04/08/24  Educator [facility]  Instruction Review Code 1- Verbalizes Understanding    Education: Exercise Physiology & General Exercise Guidelines: - Group verbal and written instruction with models to review the exercise physiology of the cardiovascular system and associated critical values. Provides general exercise guidelines with specific guidelines to those with heart or lung disease.    Education: Flexibility, Balance, Mind/Body Relaxation: - Group verbal and visual presentation with interactive activity on the components of exercise prescription. Introduces F.I.T.T principle from ACSM for exercise prescriptions. Reviews F.I.T.T. principles of flexibility and balance exercise training including  progression. Also discusses the mind body connection.  Reviews various relaxation techniques to help reduce and manage stress (i.e. Deep breathing, progressive muscle relaxation, and visualization). Balance handout provided to take home. Written material given at graduation. Flowsheet Row Pulmonary Rehab from 05/08/2024 in [facility] Cardiac and Pulmonary Rehab  Date 04/10/24  Educator [facility]  Instruction Review Code 1- Verbalizes Understanding    Activity Barriers & Risk Stratification:  Activity Barriers & Cardiac Risk Stratification - 04/03/24 1015       Activity Barriers & Cardiac Risk Stratification   Activity Barriers Joint Problems;Back Problems;Balance Concerns;Muscular Weakness   right ankle replacement         6 Minute Walk:  6 Minute Walk     Row Name 04/08/24 1032         6 Minute Walk   Phase Initial     Distance 1165 feet     Walk Time 6 minutes     # of Rest Breaks 0     MPH 2.21     METS 2.11     RPE 12     Perceived Dyspnea  2     VO2 Peak 7.38     Symptoms No     Resting HR 55 bpm     Resting BP 112/58     Resting Oxygen Saturation  99 %     Exercise Oxygen Saturation  during 6 min walk 97 %     Max Ex. HR 88 bpm     Max Ex. BP 148/62     2 Minute Post BP 122/58       Interval HR   1 Minute HR 78     2 Minute HR 79     3 Minute HR 82     4 Minute HR 84     5 Minute HR 88     6 Minute HR 87     2 Minute Post HR 55     Interval Heart Rate? Yes       Interval Oxygen   Interval Oxygen? Yes     Baseline Oxygen Saturation % 99 %     1 Minute Oxygen Saturation % 98 %     1 Minute Liters of Oxygen 0 L  RA     2 Minute Oxygen Saturation % 98 %  2 Minute Liters of Oxygen 0 L     3 Minute Oxygen Saturation % 98 %     3 Minute Liters of Oxygen 0 L     4 Minute Oxygen Saturation % 97 %     4 Minute Liters of Oxygen 0 L     5 Minute Oxygen Saturation % 98 %     5 Minute Liters of Oxygen 0 L     6 Minute Oxygen Saturation % 99 %     6 Minute Liters of  Oxygen 0 L     2 Minute Post Oxygen Saturation % 99 %     2 Minute Post Liters of Oxygen 0 L       Oxygen Initial Assessment:  Oxygen Initial Assessment - 04/03/24 1017       Home Oxygen   Home Oxygen Device None    Sleep Oxygen Prescription CPAP    Home Exercise Oxygen Prescription None    Home Resting Oxygen Prescription None    Compliance with Home Oxygen Use Yes      Intervention   Short Term Goals To learn and understand importance of monitoring SPO2 with pulse oximeter and demonstrate accurate use of the pulse oximeter.;To learn and understand importance of maintaining oxygen saturations>88%;To learn and demonstrate proper pursed lip breathing techniques or other breathing techniques. ;To learn and demonstrate proper use of respiratory medications    Long  Term Goals Verbalizes importance of monitoring SPO2 with pulse oximeter and return demonstration;Maintenance of O2 saturations>88%;Exhibits proper breathing techniques, such as pursed lip breathing or other method taught during program session;Compliance with respiratory medication;Demonstrates proper use of MDI's          Oxygen Re-Evaluation:  Oxygen Re-Evaluation     Row Name 05/16/24 1421             Program Oxygen Prescription   Program Oxygen Prescription None         Home Oxygen   Home Oxygen Device None       Sleep Oxygen Prescription CPAP       Liters per minute 0       Home Exercise Oxygen Prescription None       Home Resting Oxygen Prescription None       Compliance with Home Oxygen Use Yes         Goals/Expected Outcomes   Short Term Goals To learn and demonstrate proper pursed lip breathing techniques or other breathing techniques.        Long  Term Goals Exhibits proper breathing techniques, such as pursed lip breathing or other method taught during program session       Comments Informed patient how to perform the Pursed Lipped breathing technique. Told patient to Inhale through the nose and out  the mouth with pursed lips to keep their airways open, help oxygenate them better, practice when at rest or doing strenuous activity. Patient Verbalizes understanding of technique and will work on and be reiterated during [facility].       Goals/Expected Outcomes Short: use PLB with exertion. Long: use PLB on exertion proficiently and independently.          Oxygen Discharge (Final Oxygen Re-Evaluation):  Oxygen Re-Evaluation - 05/16/24 1421       Program Oxygen Prescription   Program Oxygen Prescription None      Home Oxygen   Home Oxygen Device None    Sleep Oxygen Prescription CPAP    Liters per minute 0  Home Exercise Oxygen Prescription None    Home Resting Oxygen Prescription None    Compliance with Home Oxygen Use Yes      Goals/Expected Outcomes   Short Term Goals To learn and demonstrate proper pursed lip breathing techniques or other breathing techniques.     Long  Term Goals Exhibits proper breathing techniques, such as pursed lip breathing or other method taught during program session    Comments Informed patient how to perform the Pursed Lipped breathing technique. Told patient to Inhale through the nose and out the mouth with pursed lips to keep their airways open, help oxygenate them better, practice when at rest or doing strenuous activity. Patient Verbalizes understanding of technique and will work on and be reiterated during [facility].    Goals/Expected Outcomes Short: use PLB with exertion. Long: use PLB on exertion proficiently and independently.          Initial Exercise Prescription:  Initial Exercise Prescription - 04/08/24 1000       Date of Initial Exercise RX and Referring Provider   Date 04/08/24    Referring Provider Dr. Jereld Boos, MD      Oxygen   Maintain Oxygen Saturation 88% or higher      Treadmill   MPH 2.5    Grade 0    Minutes 15    METs 2.91      NuStep   Level 2   T6 nustep   SPM 80    Minutes 15    METs 2.11      REL-XR    Level 2    Speed 50    Minutes 15    METs 2.11      Prescription Details   Frequency (times per week) 2    Duration Progress to 30 minutes of continuous aerobic without signs/symptoms of physical distress      Intensity   THRR 40-80% of Max Heartrate 89-123    Ratings of Perceived Exertion 11-13    Perceived Dyspnea 0-4      Progression   Progression Continue to progress workloads to maintain intensity without signs/symptoms of physical distress.      Resistance Training   Training Prescription Yes    Weight 8 lb    Reps 10-15          Perform Capillary Blood Glucose checks as needed.  Exercise Prescription Changes:   Exercise Prescription Changes     Row Name 04/08/24 1000 04/24/24 1100 05/08/24 1400 05/22/24 0800       Response to Exercise   Blood Pressure (Admit) 112/58 110/68 108/68 102/60    Blood Pressure (Exercise) 148/62 152/74 144/72 144/68    Blood Pressure (Exit) 122/58 102/58 110/64 118/54    Heart Rate (Admit) 55 bpm 63 bpm 56 bpm 61 bpm    Heart Rate (Exercise) 88 bpm 93 bpm 93 bpm 91 bpm    Heart Rate (Exit) 55 bpm 70 bpm 67 bpm 68 bpm    Oxygen Saturation (Admit) 99 % 98 % 98 % 97 %    Oxygen Saturation (Exercise) 97 % 97 % 96 % 97 %    Oxygen Saturation (Exit) 99 % 97 % 98 % 98 %    Rating of Perceived Exertion (Exercise) 12 13 15 13     Perceived Dyspnea (Exercise) 2 2 1 2     Symptoms none none none none    Comments Results first 2 weeks of exercise first 2 weeks of exercise --  Duration -- Progress to 30 minutes of  aerobic without signs/symptoms of physical distress Progress to 30 minutes of  aerobic without signs/symptoms of physical distress Continue with 30 min of aerobic exercise without signs/symptoms of physical distress.    Intensity -- THRR unchanged THRR unchanged THRR unchanged      Progression   Progression -- Continue to progress workloads to maintain intensity without signs/symptoms of physical distress. Continue to  progress workloads to maintain intensity without signs/symptoms of physical distress. Continue to progress workloads to maintain intensity without signs/symptoms of physical distress.    Average METs -- 3.13 3.39 3.32      Resistance Training   Training Prescription -- Yes Yes Yes    Weight -- 8 lb 8 lb 8 lb    Reps -- 10-15 10-15 10-15      Interval Training   Interval Training -- No No No      Treadmill   MPH -- 2.8 2.6 2.6    Grade -- 0 0 0    Minutes -- 15 15 15     METs -- 3.14 2.99 2.99      NuStep   Level -- 3  T6 2  T6 5  T6 nustep    Minutes -- 15 15 15     METs -- 2.1 2.5 2.6      REL-XR   Level -- 2 10 5     Minutes -- 15 15 15     METs -- 4.6 7.2 4.2      Oxygen   Maintain Oxygen Saturation -- 88% or higher 88% or higher 88% or higher       Exercise Comments:   Exercise Comments     Row Name 04/10/24 1351           Exercise Comments First full day of exercise!  Patient was oriented to gym and equipment including functions, settings, policies, and procedures.  Patient's individual exercise prescription and treatment plan were reviewed.  All starting workloads were established based on the results of the 6 minute walk test done at initial orientation visit.  The plan for exercise progression was also introduced and progression will be customized based on patient's performance and goals.          Exercise Goals and Review:   Exercise Goals     Row Name 04/08/24 1030             Exercise Goals   Increase Physical Activity Yes       Intervention Provide advice, education, support and counseling about physical activity/exercise needs.;Develop an individualized exercise prescription for aerobic and resistive training based on initial evaluation findings, risk stratification, comorbidities and participant's personal goals.       Expected Outcomes Short Term: Attend rehab on a regular basis to increase amount of physical activity.;Long Term: Add in home  exercise to make exercise part of routine and to increase amount of physical activity.;Long Term: Exercising regularly at least 3-5 days a week.       Increase Strength and Stamina Yes       Intervention Develop an individualized exercise prescription for aerobic and resistive training based on initial evaluation findings, risk stratification, comorbidities and participant's personal goals.;Provide advice, education, support and counseling about physical activity/exercise needs.       Expected Outcomes Short Term: Increase workloads from initial exercise prescription for resistance, speed, and METs.;Short Term: Perform resistance training exercises routinely during rehab and add in resistance training at home;Long Term: Improve cardiorespiratory  fitness, muscular endurance and strength as measured by increased METs and functional capacity ( )       Able to understand and use rate of perceived exertion (RPE) scale Yes       Intervention Provide education and explanation on how to use RPE scale       Expected Outcomes Short Term: Able to use RPE daily in rehab to express subjective intensity level;Long Term:  Able to use RPE to guide intensity level when exercising independently       Able to understand and use Dyspnea scale Yes       Intervention Provide education and explanation on how to use Dyspnea scale       Expected Outcomes Short Term: Able to use Dyspnea scale daily in rehab to express subjective sense of shortness of breath during exertion;Long Term: Able to use Dyspnea scale to guide intensity level when exercising independently       Knowledge and understanding of Target Heart Rate Range (THRR) Yes       Intervention Provide education and explanation of THRR including how the numbers were predicted and where they are located for reference       Expected Outcomes Short Term: Able to state/look up THRR;Long Term: Able to use THRR to govern intensity when exercising independently;Short Term:  Able to use daily as guideline for intensity in rehab       Able to check pulse independently Yes       Intervention Provide education and demonstration on how to check pulse in carotid and radial arteries.;Review the importance of being able to check your own pulse for safety during independent exercise       Expected Outcomes Long Term: Able to check pulse independently and accurately;Short Term: Able to explain why pulse checking is important during independent exercise       Understanding of Exercise Prescription Yes       Intervention Provide education, explanation, and written materials on patient's individual exercise prescription       Expected Outcomes Short Term: Able to explain program exercise prescription;Long Term: Able to explain home exercise prescription to exercise independently          Exercise Goals Re-Evaluation :  Exercise Goals Re-Evaluation     Row Name 04/10/24 1352 04/24/24 1118 05/08/24 1417 05/22/24 0850       Exercise Goal Re-Evaluation   Exercise Goals Review Increase Physical Activity;Knowledge and understanding of Target Heart Rate Range (THRR);Able to understand and use rate of perceived exertion (RPE) scale;Understanding of Exercise Prescription;Increase Strength and Stamina;Able to check pulse independently Increase Physical Activity;Increase Strength and Stamina;Understanding of Exercise Prescription Increase Physical Activity;Increase Strength and Stamina;Understanding of Exercise Prescription Increase Physical Activity;Increase Strength and Stamina;Understanding of Exercise Prescription    Comments Reviewed RPE and dyspnea scale, THR and program prescription with pt today.  Pt voiced understanding and was given a copy of goals to take home. Daryle is off to a good start in the program. He was able to attend his first 4 sessions during this review period. During his few sessions he was able to use the treadmill at a speed of 2. and no incline. He was also  able to increase to level 3 on the T6 nustep. We will continue to monitor his progress in the program. Strider continues to do well in the program. He was recently able to increase from level 2 to 10 on the XR. He was also able to use the Treadmill at a workload  of 2.51mph and no incline. We will continue to monitor his progress in the program. Fortino is doing well in rehab. He continues to do well on the treadmill at a speed of 2.6 mph with no incline. He also improved to level 5 on the T6 nustep and continues to use 8 lb handweights for resistance training. We will continue to monitor his progress in the program.    Expected Outcomes Short: Use RPE daily to regulate intensity.  Long: Follow program prescription in THR. Short: Continue to follow exercise prescription. Long: Continue exercise to improve strength and stamina. Short: Continue to follow exercise prescription. Long: Continue exercise to improve strength and stamina. Short: Continue to progressively increase treadmill workload. Long: Continue exercise to improve strength and stamina.       Discharge Exercise Prescription (Final Exercise Prescription Changes):  Exercise Prescription Changes - 05/22/24 0800       Response to Exercise   Blood Pressure (Admit) 102/60    Blood Pressure (Exercise) 144/68    Blood Pressure (Exit) 118/54    Heart Rate (Admit) 61 bpm    Heart Rate (Exercise) 91 bpm    Heart Rate (Exit) 68 bpm    Oxygen Saturation (Admit) 97 %    Oxygen Saturation (Exercise) 97 %    Oxygen Saturation (Exit) 98 %    Rating of Perceived Exertion (Exercise) 13    Perceived Dyspnea (Exercise) 2    Symptoms none    Duration Continue with 30 min of aerobic exercise without signs/symptoms of physical distress.    Intensity THRR unchanged      Progression   Progression Continue to progress workloads to maintain intensity without signs/symptoms of physical distress.    Average METs 3.32      Resistance Training   Training  Prescription Yes    Weight 8 lb    Reps 10-15      Interval Training   Interval Training No      Treadmill   MPH 2.6    Grade 0    Minutes 15    METs 2.99      NuStep   Level 5   T6 nustep   Minutes 15    METs 2.6      REL-XR   Level 5    Minutes 15    METs 4.2      Oxygen   Maintain Oxygen Saturation 88% or higher          Nutrition:  Target Goals: Understanding of nutrition guidelines, daily intake of sodium 1500mg , cholesterol 200mg , calories 30% from fat and 7% or less from saturated fats, daily to have 5 or more servings of fruits and vegetables.  Education: All About Nutrition: -Group instruction provided by verbal, written material, interactive activities, discussions, models, and posters to present general guidelines for heart healthy nutrition including fat, fiber, MyPlate, the role of sodium in heart healthy nutrition, utilization of the nutrition label, and utilization of this knowledge for meal planning. Follow up email sent as well. Written material given at graduation. Flowsheet Row Pulmonary Rehab from 05/08/2024 in [facility] Cardiac and Pulmonary Rehab  Date 04/24/24  Educator JG part 1  [part 2 6/11]  Instruction Review Code 1- Verbalizes Understanding    Biometrics:  Pre Biometrics - 04/08/24 1031       Pre Biometrics   Height 6' 0.75 (1.848 m)    Weight 217 lb 3.2 oz (98.5 kg)    Waist Circumference 39.5 inches    Hip  Circumference 43 inches    Waist to Hip Ratio 0.92 %    BMI (Calculated) 28.85    Single Leg Stand 2.9 seconds           Nutrition Therapy Plan and Nutrition Goals:  Nutrition Therapy & Goals - 04/08/24 1015       Nutrition Therapy   Diet Mediterranean    Protein (specify units) 70-90g    Fiber 30 grams    Whole Grain Foods 3 servings    Saturated Fats 15 max. grams    Fruits and Vegetables 5 servings/day    Sodium 2 grams      Personal Nutrition Goals   Nutrition Goal Eat 15-30gProtein and 30-60gCarbs at each  meal.    Personal Goal #2 Eat 3 times per day, small frequent meals or nutrient dense snacks    Comments Patient drinking ~48oz of water  daily. He reports he usually eats 3 meals per day. Sometimes will miss lunch if he busy doing something. Encouraged him to have a snack if he thinks he might miss a meal. He agrees he should do that. Brainstormed several meal and snack ideas focusing on balancing carbs with protein or healthy fats. Provided mediterranean diet handout. Educated on the importance of meeting his nutritional goals with COPD.      Intervention Plan   Intervention Prescribe, educate and counsel regarding individualized specific dietary modifications aiming towards targeted core components such as weight, hypertension, lipid management, diabetes, heart failure and other comorbidities.;Nutrition handout(s) given to patient.    Expected Outcomes Long Term Goal: Adherence to prescribed nutrition plan.;Short Term Goal: A plan has been developed with personal nutrition goals set during dietitian appointment.;Short Term Goal: Understand basic principles of dietary content, such as calories, fat, sodium, cholesterol and nutrients.          Nutrition Assessments:  MEDIFICTS Score Key: >=70 Need to make dietary changes  40-70 Heart Healthy Diet <= 40 Therapeutic Level Cholesterol Diet  Flowsheet Row Pulmonary Rehab from 04/08/2024 in [facility] Cardiac and Pulmonary Rehab  Picture Your Plate Total Score on Admission 75   Picture Your Plate Scores: <59 Unhealthy dietary pattern with much room for improvement. 41-50 Dietary pattern unlikely to meet recommendations for good health and room for improvement. 51-60 More healthful dietary pattern, with some room for improvement.  >60 Healthy dietary pattern, although there may be some specific behaviors that could be improved.   Nutrition Goals Re-Evaluation:  Nutrition Goals Re-Evaluation     Row Name 05/16/24 1425             Goals    Comment Patient was informed on why it is important to maintain a balanced diet when dealing with Respiratory issues. Explained that it takes a lot of energy to breath and when they are short of breath often they will need to have a good diet to help keep up with the calories they are expending for breathing.       Expected Outcome Short: Choose and plan snacks accordingly to patients caloric intake to improve breathing. Long: Maintain a diet independently that meets their caloric intake to aid in daily shortness of breath.          Nutrition Goals Discharge (Final Nutrition Goals Re-Evaluation):  Nutrition Goals Re-Evaluation - 05/16/24 1425       Goals   Comment Patient was informed on why it is important to maintain a balanced diet when dealing with Respiratory issues. Explained that it takes a lot of  energy to breath and when they are short of breath often they will need to have a good diet to help keep up with the calories they are expending for breathing.    Expected Outcome Short: Choose and plan snacks accordingly to patients caloric intake to improve breathing. Long: Maintain a diet independently that meets their caloric intake to aid in daily shortness of breath.          Psychosocial: Target Goals: Acknowledge presence or absence of significant depression and/or stress, maximize coping skills, provide positive support system. Participant is able to verbalize types and ability to use techniques and skills needed for reducing stress and depression.   Education: Stress, Anxiety, and Depression - Group verbal and visual presentation to define topics covered.  Reviews how body is impacted by stress, anxiety, and depression.  Also discusses healthy ways to reduce stress and to treat/manage anxiety and depression.  Written material given at graduation.   Education: Sleep Hygiene -Provides group verbal and written instruction about how sleep can affect your health.  Define sleep hygiene,  discuss sleep cycles and impact of sleep habits. Review good sleep hygiene tips.    Initial Review & Psychosocial Screening:  Initial Psych Review & Screening - 04/03/24 1021       Initial Review   Current issues with History of Depression      Family Dynamics   Good Support System? Yes   family, friends     Barriers   Psychosocial barriers to participate in program There are no identifiable barriers or psychosocial needs.;The patient should benefit from training in stress management and relaxation.      Screening Interventions   Interventions Encouraged to exercise    Expected Outcomes Short Term goal: Utilizing psychosocial counselor, staff and physician to assist with identification of specific Stressors or current issues interfering with healing process. Setting desired goal for each stressor or current issue identified.;Long Term Goal: Stressors or current issues are controlled or eliminated.;Short Term goal: Identification and review with participant of any Quality of Life or Depression concerns found by scoring the questionnaire.;Long Term goal: The participant improves quality of Life and PHQ9 Scores as seen by post scores and/or verbalization of changes          Quality of Life Scores:  Scores of 19 and below usually indicate a poorer quality of life in these areas.  A difference of  2-3 points is a clinically meaningful difference.  A difference of 2-3 points in the total score of the Quality of Life Index has been associated with significant improvement in overall quality of life, self-image, physical symptoms, and general health in studies assessing change in quality of life.  PHQ-9: Review Flowsheet       05/16/2024 04/08/2024  Depression screen PHQ 2/9  Decreased Interest 0 1  Down, Depressed, Hopeless 1 2  PHQ - 2 Score 1 3  Altered sleeping 1 0  Tired, decreased energy 1 1  Change in appetite 1 2  Feeling bad or failure about yourself  0 0  Trouble concentrating  1 1  Moving slowly or fidgety/restless 0 1  Suicidal thoughts 0 0  PHQ-9 Score 5 8  Difficult doing work/chores Somewhat difficult Somewhat difficult   Interpretation of Total Score  Total Score Depression Severity:  1-4 = Minimal depression, 5-9 = Mild depression, 10-14 = Moderate depression, 15-19 = Moderately severe depression, 20-27 = Severe depression   Psychosocial Evaluation and Intervention:  Psychosocial Evaluation - 04/03/24 1024  Psychosocial Evaluation & Interventions   Interventions Encouraged to exercise with the program and follow exercise prescription    Comments Mr. Mcwhirter is coming to Pulmonary Rehab with COPD. He states he has no concerns currently with stress or anxiety/depression, but does have a history of PTSD. He has a great support system and feels like he is managing his health well. He is interested in learning more about risk factor management and increasing his stamina while in the program.    Expected Outcomes Short: attend pulmonary rehab for exercise and education. Long; develop and maintain positive self care habits    Continue Psychosocial Services  Follow up required by staff          Psychosocial Re-Evaluation:  Psychosocial Re-Evaluation     Row Name 05/16/24 1426             Psychosocial Re-Evaluation   Current issues with None Identified       Comments Reviewed patient health questionnaire (PHQ-9) with patient for follow up. Previously, patients score indicated signs/symptoms of depression.  Reviewed to see if patient is improving symptom wise while in program.  Score improved and patient states that it is because he has been able to exerise more.       Expected Outcomes Short: Continue to attend [facility] regularly for regular exercise and social engagement. Long: Continue to improve symptoms and manage a positive mental state.       Interventions Encouraged to attend Pulmonary Rehabilitation for the exercise       Continue  Psychosocial Services  Follow up required by staff          Psychosocial Discharge (Final Psychosocial Re-Evaluation):  Psychosocial Re-Evaluation - 05/16/24 1426       Psychosocial Re-Evaluation   Current issues with None Identified    Comments Reviewed patient health questionnaire (PHQ-9) with patient for follow up. Previously, patients score indicated signs/symptoms of depression.  Reviewed to see if patient is improving symptom wise while in program.  Score improved and patient states that it is because he has been able to exerise more.    Expected Outcomes Short: Continue to attend [facility] regularly for regular exercise and social engagement. Long: Continue to improve symptoms and manage a positive mental state.    Interventions Encouraged to attend Pulmonary Rehabilitation for the exercise    Continue Psychosocial Services  Follow up required by staff          Education: Education Goals: Education classes will be provided on a weekly basis, covering required topics. Participant will state understanding/return demonstration of topics presented.  Learning Barriers/Preferences:  Learning Barriers/Preferences - 04/03/24 1020       Learning Barriers/Preferences   Learning Barriers None    Learning Preferences None          General Pulmonary Education Topics:  Infection Prevention: - Provides verbal and written material to individual with discussion of infection control including proper hand washing and proper equipment cleaning during exercise session. Flowsheet Row Pulmonary Rehab from 05/08/2024 in [facility] Cardiac and Pulmonary Rehab  Date 04/08/24  Educator NT  Instruction Review Code 1- Verbalizes Understanding    Falls Prevention: - Provides verbal and written material to individual with discussion of falls prevention and safety. Flowsheet Row Pulmonary Rehab from 05/08/2024 in [facility] Cardiac and Pulmonary Rehab  Date 04/08/24  Educator NT  Instruction Review Code 1-  Verbalizes Understanding    Chronic Lung Disease Review: - Group verbal instruction with posters, models, PowerPoint presentations and videos,  to review new updates, new respiratory medications, new advancements in procedures and treatments. Providing information on websites and 800 numbers for continued self-education. Includes information about supplement oxygen, available portable oxygen systems, continuous and intermittent flow rates, oxygen safety, concentrators, and Medicare reimbursement for oxygen. Explanation of Pulmonary Drugs, including class, frequency, complications, importance of spacers, rinsing mouth after steroid MDI's, and proper cleaning methods for nebulizers. Review of basic lung anatomy and physiology related to function, structure, and complications of lung disease. Review of risk factors. Discussion about methods for diagnosing sleep apnea and types of masks and machines for OSA. Includes a review of the use of types of environmental controls: home humidity, furnaces, filters, dust mite/pet prevention, HEPA vacuums. Discussion about weather changes, air quality and the benefits of nasal washing. Instruction on Warning signs, infection symptoms, calling MD promptly, preventive modes, and value of vaccinations. Review of effective airway clearance, coughing and/or vibration techniques. Emphasizing that all should Create an Action Plan. Written material given at graduation. Flowsheet Row Pulmonary Rehab from 05/08/2024 in [facility] Cardiac and Pulmonary Rehab  Education need identified 04/08/24    AED/CPR: - Group verbal and written instruction with the use of models to demonstrate the basic use of the AED with the basic ABC's of resuscitation.    Anatomy and Cardiac Procedures: - Group verbal and visual presentation and models provide information about basic cardiac anatomy and function. Reviews the testing methods done to diagnose heart disease and the outcomes of the test results.  Describes the treatment choices: Medical Management, Angioplasty, or Coronary Bypass Surgery for treating various heart conditions including Myocardial Infarction, Angina, Valve Disease, and Cardiac Arrhythmias.  Written material given at graduation. Flowsheet Row Pulmonary Rehab from 05/08/2024 in [facility] Cardiac and Pulmonary Rehab  Date 05/08/24  Educator SB  Instruction Review Code 1- Verbalizes Understanding    Medication Safety: - Group verbal and visual instruction to review commonly prescribed medications for heart and lung disease. Reviews the medication, class of the drug, and side effects. Includes the steps to properly store meds and maintain the prescription regimen.  Written material given at graduation.   Other: -Provides group and verbal instruction on various topics (see comments)   Knowledge Questionnaire Score:  Knowledge Questionnaire Score - 04/08/24 1020       Knowledge Questionnaire Score   Pre Score 15/18           Core Components/Risk Factors/Patient Goals at Admission:  Personal Goals and Risk Factors at Admission - 04/03/24 1019       Core Components/Risk Factors/Patient Goals on Admission    Weight Management Yes;Weight Maintenance    Intervention Weight Management: Develop a combined nutrition and exercise program designed to reach desired caloric intake, while maintaining appropriate intake of nutrient and fiber, sodium and fats, and appropriate energy expenditure required for the weight goal.;Weight Management: Provide education and appropriate resources to help participant work on and attain dietary goals.;Weight Management/Obesity: Establish reasonable short term and long term weight goals.    Expected Outcomes Weight Maintenance: Understanding of the daily nutrition guidelines, which includes 25-35% calories from fat, 7% or less cal from saturated fats, less than 200mg  cholesterol, less than 1.5gm of sodium, & 5 or more servings of fruits and vegetables  daily;Long Term: Adherence to nutrition and physical activity/exercise program aimed toward attainment of established weight goal;Short Term: Continue to assess and modify interventions until short term weight is achieved    Improve shortness of breath with ADL's Yes    Intervention Provide  education, individualized exercise plan and daily activity instruction to help decrease symptoms of SOB with activities of daily living.    Expected Outcomes Short Term: Improve cardiorespiratory fitness to achieve a reduction of symptoms when performing ADLs;Long Term: Be able to perform more ADLs without symptoms or delay the onset of symptoms    Hypertension Yes    Intervention Provide education on lifestyle modifcations including regular physical activity/exercise, weight management, moderate sodium restriction and increased consumption of fresh fruit, vegetables, and low fat dairy, alcohol  moderation, and smoking cessation.;Monitor prescription use compliance.    Expected Outcomes Short Term: Continued assessment and intervention until BP is < 140/2mm HG in hypertensive participants. < 130/65mm HG in hypertensive participants with diabetes, heart failure or chronic kidney disease.;Long Term: Maintenance of blood pressure at goal levels.    Lipids Yes    Intervention Provide education and support for participant on nutrition & aerobic/resistive exercise along with prescribed medications to achieve LDL 70mg , HDL >40mg .    Expected Outcomes Short Term: Participant states understanding of desired cholesterol values and is compliant with medications prescribed. Participant is following exercise prescription and nutrition guidelines.;Long Term: Cholesterol controlled with medications as prescribed, with individualized exercise RX and with personalized nutrition plan. Value goals: LDL < 70mg , HDL > 40 mg.          Education:Diabetes - Individual verbal and written instruction to review signs/symptoms of diabetes,  desired ranges of glucose level fasting, after meals and with exercise. Acknowledge that pre and post exercise glucose checks will be done for 3 sessions at entry of program.   Know Your Numbers and Heart Failure: - Group verbal and visual instruction to discuss disease risk factors for cardiac and pulmonary disease and treatment options.  Reviews associated critical values for Overweight/Obesity, Hypertension, Cholesterol, and Diabetes.  Discusses basics of heart failure: signs/symptoms and treatments.  Introduces Heart Failure Zone chart for action plan for heart failure.  Written material given at graduation.   Core Components/Risk Factors/Patient Goals Review:   Goals and Risk Factor Review     Row Name 05/16/24 1424             Core Components/Risk Factors/Patient Goals Review   Personal Goals Review Weight Management/Obesity       Review Spoke to patient about their shortness of breath and what they can do to improve. Patient has been informed of breathing techniques when starting the program. Patient is informed to tell staff if they have had any med changes and that certain meds they are taking or not taking can be causing shortness of breath.       Expected Outcomes Short: Attend [facility] regularly to improve shortness of breath with ADL's. Long: maintain independence with ADL's          Core Components/Risk Factors/Patient Goals at Discharge (Final Review):   Goals and Risk Factor Review - 05/16/24 1424       Core Components/Risk Factors/Patient Goals Review   Personal Goals Review Weight Management/Obesity    Review Spoke to patient about their shortness of breath and what they can do to improve. Patient has been informed of breathing techniques when starting the program. Patient is informed to tell staff if they have had any med changes and that certain meds they are taking or not taking can be causing shortness of breath.    Expected Outcomes Short: Attend [facility]  regularly to improve shortness of breath with ADL's. Long: maintain independence with ADL's  ITP Comments:  ITP Comments     Row Name 04/03/24 1026 04/08/24 1028 04/10/24 1351 04/24/24 1114 05/22/24 1132   ITP Comments Initial phone call completed. Diagnosis can be found in [facility] 5/7. EP Orientation scheduled for Monday 5/19 at 9am. Completed and gym orientation for pulmonary rehab. Initial ITP created and sent for review to Dr. Faud Aleskerov, Medical Director. First full day of exercise!  Patient was oriented to gym and equipment including functions, settings, policies, and procedures.  Patient's individual exercise prescription and treatment plan were reviewed.  All starting workloads were established based on the results of the 6 minute walk test done at initial orientation visit.  The plan for exercise progression was also introduced and progression will be customized based on patient's performance and goals. 30 Day review completed. Medical Director ITP review done, changes made as directed, and signed approval by Medical Director.    new to program 30 Day review completed. Medical Director ITP review done, changes made as directed, and signed approval by Medical Director.      Comments: 30 day review

## 2024-05-23 ENCOUNTER — Encounter: Admitting: *Deleted

## 2024-05-23 DIAGNOSIS — J449 Chronic obstructive pulmonary disease, unspecified: Secondary | ICD-10-CM

## 2024-05-23 NOTE — Progress Notes (Signed)
 Daily Session Note  Patient Details  Name: Wesley Weber MRN: [id number] Date of Birth: Oct 21, 1946 Referring Provider:   [name] Pulmonary Rehab from 04/08/2024 in [facility] Cardiac and Pulmonary Rehab  Referring Provider Dr. Jereld Boos, MD    Encounter Date: 05/23/2024  Check In:  Session Check In - 05/23/24 1359       Check-In   Supervising physician immediately available to respond to emergencies See telemetry face sheet for immediately available ER MD    Location [facility]-Cardiac & Pulmonary Rehab    Staff Present Hoy Rodney RN,BSN;Joseph Rolinda NORWOOD HARMAN SAMMIE;Othel Durand, RN, BSN, CCRP;Noah Tickle, BS, Exercise Physiologist    Virtual Visit No    Medication changes reported     No    Fall or balance concerns reported    No    Warm-up and Cool-down Performed on first and last piece of equipment    Resistance Training Performed Yes    VAD Patient? No    PAD/SET Patient? No      Pain Assessment   Currently in Pain? No/denies             Social History   Tobacco Use  Smoking Status Former   Current packs/day: 0.00   Types: Cigarettes   Start date: 09/06/1976   Quit date: 09/06/1986   Years since quitting: 37.7  Smokeless Tobacco Never    Goals Met:  Independence with exercise equipment Exercise tolerated well No report of concerns or symptoms today Strength training completed today  Goals Unmet:  Not Applicable  Comments: Pt able to follow exercise prescription today without complaint.  Will continue to monitor for progression.   Dr. Oneil Pinal is Medical Director for [facility] Cardiac Rehabilitation.  Dr. Fuad Aleskerov is Medical Director for [facility] Pulmonary Rehabilitation.

## 2024-05-29 ENCOUNTER — Encounter: Admitting: *Deleted

## 2024-05-29 DIAGNOSIS — J449 Chronic obstructive pulmonary disease, unspecified: Secondary | ICD-10-CM

## 2024-05-29 NOTE — Progress Notes (Signed)
 Daily Session Note  Patient Details  Name: Wesley Weber MRN: [id number] Date of Birth: [birth year] Referring Provider:   [name] Pulmonary Rehab from 04/08/2024 in [facility] Cardiac and Pulmonary Rehab  Referring Provider Dr. Jereld Boos, MD    Encounter Date: 05/29/2024  Check In:  Session Check In - 05/29/24 1402       Check-In   Supervising physician immediately available to respond to emergencies See telemetry face sheet for immediately available ER MD    Location [facility]-Cardiac & Pulmonary Rehab    Staff Present Hoy Rodney RN,BSN;Joseph [facility] BS, ACSM CEP, Exercise Physiologist;Kelly [facility]    Virtual Visit No    Medication changes reported     No    Fall or balance concerns reported    No    Warm-up and Cool-down Performed on first and last piece of equipment    Resistance Training Performed Yes    VAD Patient? No    PAD/SET Patient? No      Pain Assessment   Currently in Pain? No/denies             Social History   Tobacco Use  Smoking Status Former   Current packs/day: 0.00   Types: Cigarettes   Start date: 09/06/1976   Quit date: 09/06/1986   Years since quitting: 37.7  Smokeless Tobacco Never    Goals Met:  Independence with exercise equipment Exercise tolerated well No report of concerns or symptoms today Strength training completed today  Goals Unmet:  Not Applicable  Comments: Pt able to follow exercise prescription today without complaint.  Will continue to monitor for progression.    Dr. Oneil Pinal is Medical Director for [facility] Cardiac Rehabilitation.  Dr. Fuad Aleskerov is Medical Director for [facility] Pulmonary Rehabilitation.

## 2024-05-30 ENCOUNTER — Encounter: Admitting: *Deleted

## 2024-05-30 DIAGNOSIS — J449 Chronic obstructive pulmonary disease, unspecified: Secondary | ICD-10-CM

## 2024-05-30 NOTE — Progress Notes (Signed)
 Daily Session Note  Patient Details  Name: Wesley Weber MRN: [id number] Date of Birth: [birth year] Referring Provider:   [name] from 04/08/2024 in [facility] Cardiac and Pulmonary Rehab  Referring Provider Dr. Jereld Boos, MD    Encounter Date: 05/30/2024  Check In:  Session Check In - 05/30/24 1355       Check-In   Supervising physician immediately available to respond to emergencies See telemetry face sheet for immediately available ER MD    Location [facility]-Cardiac & Pulmonary Rehab    Staff Present Fairy Plater RCP,RRT,BSRT;Maxon Conetta BS, Exercise Physiologist;Kristen Coble RN,BC,MSN;Bijan Ridgley Tressa RN,BSN    Virtual Visit No    Medication changes reported     No    Fall or balance concerns reported    No    Warm-up and Cool-down Performed on first and last piece of equipment    Resistance Training Performed Yes    VAD Patient? No    PAD/SET Patient? No      Pain Assessment   Currently in Pain? No/denies             Social History   Tobacco Use  Smoking Status Former   Current packs/day: 0.00   Types: Cigarettes   Start date: 09/06/1976   Quit date: 09/06/1986   Years since quitting: 37.7  Smokeless Tobacco Never    Goals Met:  Independence with exercise equipment Exercise tolerated well No report of concerns or symptoms today Strength training completed today  Goals Unmet:  Not Applicable  Comments: Pt able to follow exercise prescription today without complaint.  Will continue to monitor for progression.    Dr. Oneil Pinal is Medical Director for [facility] Cardiac Rehabilitation.  Dr. Fuad Aleskerov is Medical Director for [facility] Pulmonary Rehabilitation.

## 2024-06-05 ENCOUNTER — Encounter: Admitting: *Deleted

## 2024-06-05 DIAGNOSIS — J449 Chronic obstructive pulmonary disease, unspecified: Secondary | ICD-10-CM

## 2024-06-05 NOTE — Progress Notes (Signed)
 Daily Session Note  Patient Details  Name: Wesley Weber MRN: [id number] Date of Birth: 1946/01/25 Referring Provider:   [name] [facility] from 04/08/2024 in [facility] Cardiac and Pulmonary Rehab  Referring Provider Dr. Jereld Boos, MD    Encounter Date: 06/05/2024  Check In:  Session Check In - 06/05/24 1355       Check-In   Supervising physician immediately available to respond to emergencies See telemetry face sheet for immediately available ER MD    Location [facility]-Cardiac & Pulmonary Rehab    Staff Present Hoy Rodney RN,BSN;Margaret Best, MS, Exercise Physiologist;Kelly Dyane HECKLE, ACSM CEP, Exercise Physiologist;Jason Elnor RDN,LDN;Kelly Bollinger [facility]    Virtual Visit No    Medication changes reported     No    Fall or balance concerns reported    No    Warm-up and Cool-down Performed on first and last piece of equipment    Resistance Training Performed Yes    VAD Patient? No    PAD/SET Patient? No      Pain Assessment   Currently in Pain? No/denies             Social History   Tobacco Use  Smoking Status Former   Current packs/day: 0.00   Types: Cigarettes   Start date: 09/06/1976   Quit date: 09/06/1986   Years since quitting: 37.7  Smokeless Tobacco Never    Goals Met:  Independence with exercise equipment Exercise tolerated well No report of concerns or symptoms today Strength training completed today  Goals Unmet:  Not Applicable  Comments: Pt able to follow exercise prescription today without complaint.  Will continue to monitor for progression.   Reviewed home exercise with pt today from 1:50 pm to 2:00 pm.  Pt plans to go to the [place] exercise class, and walk for exercise.  Reviewed THR, pulse, RPE, sign and symptoms, pulse oximetery and when to call 911 or MD.  Also discussed weather considerations and indoor options.  Pt voiced understanding.     Dr. Oneil Pinal is Medical Director for [facility] Cardiac  Rehabilitation.  Dr. Fuad Aleskerov is Medical Director for [facility] Pulmonary Rehabilitation.

## 2024-06-06 ENCOUNTER — Encounter: Admitting: *Deleted

## 2024-06-06 DIAGNOSIS — J449 Chronic obstructive pulmonary disease, unspecified: Secondary | ICD-10-CM

## 2024-06-06 NOTE — Progress Notes (Signed)
 Daily Session Note  Patient Details  Name: Wesley Weber MRN: [id number] Date of Birth: 12-09-1945 Referring Provider:   [name] [facility] from 04/08/2024 in [facility] Cardiac and Pulmonary Rehab  Referring Provider Dr. Jereld Boos, MD    Encounter Date: 06/06/2024  Check In:  Session Check In - 06/06/24 1443       Check-In   Supervising physician immediately available to respond to emergencies See telemetry face sheet for immediately available ER MD    Location [facility]-Cardiac & Pulmonary Rehab    Staff Present Hoy Rodney RN,BSN;[facility] BS, Exercise Physiologist;Kristen Coble RN,BC,MSN    Virtual Visit No    Medication changes reported     No    Fall or balance concerns reported    No    Warm-up and Cool-down Performed on first and last piece of equipment    Resistance Training Performed Yes    VAD Patient? No    PAD/SET Patient? No      Pain Assessment   Currently in Pain? No/denies             Social History   Tobacco Use  Smoking Status Former   Current packs/day: 0.00   Types: Cigarettes   Start date: 09/06/1976   Quit date: 09/06/1986   Years since quitting: 37.7  Smokeless Tobacco Never    Goals Met:  Independence with exercise equipment Exercise tolerated well No report of concerns or symptoms today Strength training completed today  Goals Unmet:  Not Applicable  Comments: Pt able to follow exercise prescription today without complaint.  Will continue to monitor for progression.    Dr. Oneil Pinal is Medical Director for [facility] Cardiac Rehabilitation.  Dr. Fuad Aleskerov is Medical Director for [facility] Pulmonary Rehabilitation.

## 2024-06-12 ENCOUNTER — Encounter: Admitting: *Deleted

## 2024-06-12 DIAGNOSIS — J449 Chronic obstructive pulmonary disease, unspecified: Secondary | ICD-10-CM | POA: Diagnosis not present

## 2024-06-12 NOTE — Progress Notes (Signed)
 Daily Session Note  Patient Details  Name: Wesley Weber MRN: [id number] Date of Birth: [birth year] Referring Provider:   [name] [facility] from 04/08/2024 in [facility] Cardiac and Pulmonary Rehab  Referring Provider Dr. Jereld Boos, MD    Encounter Date: 06/12/2024  Check In:  Session Check In - 06/12/24 1408       Check-In   Supervising physician immediately available to respond to emergencies See telemetry face sheet for immediately available ER MD    Location [facility]-Cardiac & Pulmonary Rehab    Staff Present Hoy Rodney RN,BSN;Kelly Dyane BS, ACSM CEP, Exercise Physiologist;Kelly Bollinger RN,BSN,MPA;Noah Tickle, BS, Exercise Physiologist    Virtual Visit No    Medication changes reported     No    Fall or balance concerns reported    No    Warm-up and Cool-down Performed on first and last piece of equipment    Resistance Training Performed Yes    VAD Patient? No    PAD/SET Patient? No      Pain Assessment   Currently in Pain? No/denies             Social History   Tobacco Use  Smoking Status Former   Current packs/day: 0.00   Types: Cigarettes   Start date: 09/06/1976   Quit date: 09/06/1986   Years since quitting: 37.7  Smokeless Tobacco Never    Goals Met:  Independence with exercise equipment Exercise tolerated well No report of concerns or symptoms today Strength training completed today  Goals Unmet:  Not Applicable  Comments: Pt able to follow exercise prescription today without complaint.  Will continue to monitor for progression.    Dr. Oneil Pinal is Medical Director for [facility] Cardiac Rehabilitation.  Dr. Fuad Aleskerov is Medical Director for [facility] Pulmonary Rehabilitation.

## 2024-06-13 ENCOUNTER — Encounter: Admitting: *Deleted

## 2024-06-13 DIAGNOSIS — J449 Chronic obstructive pulmonary disease, unspecified: Secondary | ICD-10-CM | POA: Diagnosis not present

## 2024-06-13 NOTE — Progress Notes (Signed)
 Daily Session Note  Patient Details  Name: Wesley Weber MRN: [id number] Date of Birth: 1946/06/23 Referring Provider:   [name] [facility] from 04/08/2024 in [facility] Cardiac and Pulmonary Rehab  Referring Provider Dr. Jereld Boos, MD    Encounter Date: 06/13/2024  Check In:  Session Check In - 06/13/24 1350       Check-In   Supervising physician immediately available to respond to emergencies See telemetry face sheet for immediately available ER MD    Location [facility]-Cardiac & Pulmonary Rehab    Staff Present Hoy Rodney RN,BSN;Maxon Conetta BS, Exercise Physiologist;Noah Tickle, BS, Exercise Physiologist;Kristen Coble RN,BC,MSN    Virtual Visit No    Medication changes reported     No    Fall or balance concerns reported    No    Warm-up and Cool-down Performed on first and last piece of equipment    Resistance Training Performed Yes    VAD Patient? No    PAD/SET Patient? No      Pain Assessment   Currently in Pain? No/denies             Social History   Tobacco Use  Smoking Status Former   Current packs/day: 0.00   Types: Cigarettes   Start date: 09/06/1976   Quit date: 09/06/1986   Years since quitting: 37.7  Smokeless Tobacco Never    Goals Met:  Independence with exercise equipment Exercise tolerated well No report of concerns or symptoms today Strength training completed today  Goals Unmet:  Not Applicable  Comments: Pt able to follow exercise prescription today without complaint.  Will continue to monitor for progression.    Dr. Oneil Pinal is Medical Director for [facility] Cardiac Rehabilitation.  Dr. Fuad Aleskerov is Medical Director for [facility] Pulmonary Rehabilitation.

## 2024-06-19 ENCOUNTER — Encounter: Admitting: *Deleted

## 2024-06-19 DIAGNOSIS — J449 Chronic obstructive pulmonary disease, unspecified: Secondary | ICD-10-CM | POA: Diagnosis not present

## 2024-06-19 NOTE — Progress Notes (Signed)
 Pulmonary Individual Treatment Plan  Patient Details  Name: Wesley Weber MRN: [id number] Date of Birth: 1946-09-12 Referring Provider:   [name] Pulmonary Rehab from 04/08/2024 in [facility] Cardiac and Pulmonary Rehab  Referring Provider Dr. Jereld Boos, MD    Initial Encounter Date:  Flowsheet Row Pulmonary Rehab from 04/08/2024 in [facility] Cardiac and Pulmonary Rehab  Date 04/08/24    Visit Diagnosis: Chronic obstructive pulmonary disease, unspecified COPD type (HCC)  Patient's Home Medications on Admission:  Current Outpatient Medications:    albuterol  (PROVENTIL  HFA;VENTOLIN  HFA) 108 (90 Base) MCG/ACT inhaler, Inhale 2 puffs into the lungs every 4 (four) hours as needed for wheezing or shortness of breath., Disp: , Rfl:    amLODipine  (NORVASC ) 5 MG tablet, Take 5 mg by mouth daily., Disp: , Rfl:    aspirin  EC 81 MG tablet, Take 81 mg by mouth daily. (Patient not taking: Reported on 04/03/2024), Disp: , Rfl:    azelastine (ASTELIN) 0.1 % nasal spray, Place 1 spray into both nostrils 2 (two) times daily. Use in each nostril as directed (Patient not taking: Reported on 04/03/2024), Disp: , Rfl:    budesonide (RHINOCORT AQUA) 32 MCG/ACT nasal spray, Place 1 spray into both nostrils 2 (two) times daily., Disp: , Rfl:    chlorpheniramine (CHLOR-TRIMETON) 4 MG tablet, Take 4 mg by mouth daily as needed for allergies. (Patient not taking: Reported on 04/03/2024), Disp: , Rfl:    Cholecalciferol  (VITAMIN D ) 2000 units CAPS, Take 2,000 Units by mouth daily. , Disp: , Rfl:    diclofenac Sodium (VOLTAREN) 1 % GEL, Apply 4 g topically 3 (three) times daily., Disp: , Rfl:    famotidine (PEPCID) 40 MG tablet, Take 40 mg by mouth 2 (two) times daily., Disp: , Rfl:    ferrous sulfate  325 (65 FE) MG tablet, Take 325 mg by mouth daily with breakfast., Disp: , Rfl:    fluticasone  (FLONASE ) 50 MCG/ACT nasal spray, Place 1 spray into both nostrils 2 (two) times daily. (Patient not taking: Reported on  04/03/2024), Disp: , Rfl:    ipratropium (ATROVENT) 0.06 % nasal spray, Place 2 sprays into both nostrils 2 (two) times daily., Disp: , Rfl:    ketotifen (ZADITOR) 0.035 % ophthalmic solution, Place 1 drop into both eyes 2 (two) times daily., Disp: , Rfl:    LIVALO 1 MG TABS, Take 1 tablet by mouth daily. (Patient not taking: Reported on 04/03/2024), Disp: , Rfl:    loratadine (CLARITIN) 10 MG tablet, Take 1 tablet by mouth daily as needed. (Patient not taking: Reported on 04/03/2024), Disp: , Rfl:    mometasone (ASMANEX) 220 MCG/INH inhaler, Inhale 2 puffs into the lungs at bedtime. , Disp: , Rfl:    omeprazole (PRILOSEC) 40 MG capsule, Take 40 mg by mouth daily. (Patient not taking: Reported on 04/03/2024), Disp: , Rfl:    sildenafil (VIAGRA) 100 MG tablet, Take 100 mg by mouth daily as needed for erectile dysfunction., Disp: , Rfl:    valsartan (DIOVAN) 160 MG tablet, Take 160 mg by mouth daily. , Disp: , Rfl:   Past Medical History: Past Medical History:  Diagnosis Date   Asthma    [facility]   Benign localized prostatic hyperplasia with lower urinary tract symptoms (LUTS)    urologist-- dr sherrilee--  s/p urolift twice in 2019   Chronic constipation    Chronic dryness of both eyes    Elevated PSA    Foley catheter in place    GERD (gastroesophageal  reflux disease)    Gross hematuria    History of prostatitis    Hyperlipidemia    Hypertension    followed by pcp  (11-01-2019 per pt had nuclear stress test done @[facility]  approx. 2018, told is was normal)   IDA (iron deficiency anemia)    OSA on CPAP    uses nightly   Wears glasses    Wears hearing aid in both ears     Tobacco Use: Social History   Tobacco Use  Smoking Status Former   Current packs/day: 0.00   Types: Cigarettes   Start date: 09/06/1976   Quit date: 09/06/1986   Years since quitting: 37.8  Smokeless Tobacco Never    Labs: Review Flowsheet       Latest Ref Rng & Units 11/04/2019  Labs for  [facility] Cardiac and Pulmonary Rehab  TCO2 22 - 32 mmol/L 24      Pulmonary Assessment Scores:  Pulmonary Assessment Scores     Row Name 04/08/24 1021         ADL UCSD   ADL Phase Entry     SOB Score total 64     Rest 2     Walk 3     Stairs 4     Bath 2     Dress 2     Shop 2       CAT Score   CAT Score 22       mMRC Score   mMRC Score 1        UCSD: Self-administered rating of dyspnea associated with activities of daily living (ADLs) 6-point scale (0 = not at all to 5 = maximal or unable to do because of breathlessness)  Scoring Scores range from 0 to 120.  Minimally important difference is 5 units  CAT: CAT can identify the health impairment of COPD patients and is better correlated with disease progression.  CAT has a scoring range of zero to 40. The CAT score is classified into four groups of low (less than 10), medium (10 - 20), high (21-30) and very high (31-40) based on the impact level of disease on health status. A CAT score over 10 suggests significant symptoms.  A worsening CAT score could be explained by an exacerbation, poor medication adherence, poor inhaler technique, or progression of COPD or comorbid conditions.  CAT MCID is 2 points  mMRC: mMRC (Modified Medical Research Council) Dyspnea Scale is used to assess the degree of baseline functional disability in patients of respiratory disease due to dyspnea. No minimal important difference is established. A decrease in score of 1 point or greater is considered a positive change.   Pulmonary Function Assessment:   Exercise Target Goals: Exercise Program Goal: Individual exercise prescription set using results from initial 6 min walk test and THRR while considering  patient's activity barriers and safety.   Exercise Prescription Goal: Initial exercise prescription builds to 30-45 minutes a day of aerobic activity, 2-3 days per week.  Home exercise guidelines will be given to patient during program as  part of exercise prescription that the participant will acknowledge.  Education: Aerobic Exercise: - Group verbal and visual presentation on the components of exercise prescription. Introduces F.I.T.T principle from ACSM for exercise prescriptions.  Reviews F.I.T.T. principles of aerobic exercise including progression. Written material given at graduation. Flowsheet Row Pulmonary Rehab from 06/12/2024 in [facility] Cardiac and Pulmonary Rehab  Date 04/17/24  Educator [facility]  Instruction Review Code 1- [employer] Understanding    Education:  Resistance Exercise: - Group verbal and visual presentation on the components of exercise prescription. Introduces F.I.T.T principle from ACSM for exercise prescriptions  Reviews F.I.T.T. principles of resistance exercise including progression. Written material given at graduation. Flowsheet Row Pulmonary Rehab from 06/12/2024 in [facility] Cardiac and Pulmonary Rehab  Date 04/10/24  Educator [facility]  Instruction Review Code 1- [employer] Understanding     Education: Exercise & Equipment Safety: - Individual verbal instruction and demonstration of equipment use and safety with use of the equipment. Flowsheet Row Pulmonary Rehab from 06/12/2024 in [facility] Cardiac and Pulmonary Rehab  Date 04/08/24  Educator [facility]  Instruction Review Code 1- Verbalizes Understanding    Education: Exercise Physiology & General Exercise Guidelines: - Group verbal and written instruction with models to review the exercise physiology of the cardiovascular system and associated critical values. Provides general exercise guidelines with specific guidelines to those with heart or lung disease.  Flowsheet Row Pulmonary Rehab from 06/12/2024 in [facility] Cardiac and Pulmonary Rehab  Date 06/12/24  Educator [facility]  Instruction Review Code 1- [employer] Understanding    Education: Flexibility, Balance, Mind/Body Relaxation: - Group verbal and visual presentation with interactive activity on the components of  exercise prescription. Introduces F.I.T.T principle from ACSM for exercise prescriptions. Reviews F.I.T.T. principles of flexibility and balance exercise training including progression. Also discusses the mind body connection.  Reviews various relaxation techniques to help reduce and manage stress (i.e. Deep breathing, progressive muscle relaxation, and visualization). Balance handout provided to take home. Written material given at graduation. Flowsheet Row Pulmonary Rehab from 06/12/2024 in [facility] Cardiac and Pulmonary Rehab  Date 04/10/24  Educator [facility]  Instruction Review Code 1- Verbalizes Understanding    Activity Barriers & Risk Stratification:  Activity Barriers & Cardiac Risk Stratification - 04/03/24 1015       Activity Barriers & Cardiac Risk Stratification   Activity Barriers Joint Problems;Back Problems;Balance Concerns;Muscular Weakness   right ankle replacement         6 Minute Walk:  6 Minute Walk     Row Name 04/08/24 1032         6 Minute Walk   Phase Initial     Distance 1165 feet     Walk Time 6 minutes     # of Rest Breaks 0     MPH 2.21     METS 2.11     RPE 12     Perceived Dyspnea  2     VO2 Peak 7.38     Symptoms No     Resting HR 55 bpm     Resting BP 112/58     Resting Oxygen Saturation  99 %     Exercise Oxygen Saturation  during 6 min walk 97 %     Max Ex. HR 88 bpm     Max Ex. BP 148/62     2 Minute Post BP 122/58       Interval HR   1 Minute HR 78     2 Minute HR 79     3 Minute HR 82     4 Minute HR 84     5 Minute HR 88     6 Minute HR 87     2 Minute Post HR 55     Interval Heart Rate? Yes       Interval Oxygen   Interval Oxygen? Yes     Baseline Oxygen Saturation % 99 %     1 Minute Oxygen Saturation % 98 %  1 Minute Liters of Oxygen 0 L  RA     2 Minute Oxygen Saturation % 98 %     2 Minute Liters of Oxygen 0 L     3 Minute Oxygen Saturation % 98 %     3 Minute Liters of Oxygen 0 L     4 Minute Oxygen Saturation % 97 %      4 Minute Liters of Oxygen 0 L     5 Minute Oxygen Saturation % 98 %     5 Minute Liters of Oxygen 0 L     6 Minute Oxygen Saturation % 99 %     6 Minute Liters of Oxygen 0 L     2 Minute Post Oxygen Saturation % 99 %     2 Minute Post Liters of Oxygen 0 L       Oxygen Initial Assessment:  Oxygen Initial Assessment - 04/03/24 1017       Home Oxygen   Home Oxygen Device None    Sleep Oxygen Prescription CPAP    Home Exercise Oxygen Prescription None    Home Resting Oxygen Prescription None    Compliance with Home Oxygen Use Yes      Intervention   Short Term Goals To learn and understand importance of monitoring SPO2 with pulse oximeter and demonstrate accurate use of the pulse oximeter.;To learn and understand importance of maintaining oxygen saturations>88%;To learn and demonstrate proper pursed lip breathing techniques or other breathing techniques. ;To learn and demonstrate proper use of respiratory medications    Long  Term Goals Verbalizes importance of monitoring SPO2 with pulse oximeter and return demonstration;Maintenance of O2 saturations>88%;Exhibits proper breathing techniques, such as pursed lip breathing or other method taught during program session;Compliance with respiratory medication;Demonstrates proper use of MDI's          Oxygen Re-Evaluation:  Oxygen Re-Evaluation     Row Name 05/16/24 1421 05/30/24 1409           Program Oxygen Prescription   Program Oxygen Prescription None None        Home Oxygen   Home Oxygen Device None None      Sleep Oxygen Prescription CPAP CPAP      Liters per minute 0 0      Home Exercise Oxygen Prescription None None      Home Resting Oxygen Prescription None None      Compliance with Home Oxygen Use Yes Yes        Goals/Expected Outcomes   Short Term Goals To learn and demonstrate proper pursed lip breathing techniques or other breathing techniques.  To learn and demonstrate proper pursed lip breathing techniques or  other breathing techniques.       Long  Term Goals Exhibits proper breathing techniques, such as pursed lip breathing or other method taught during program session Exhibits proper breathing techniques, such as pursed lip breathing or other method taught during program session      Comments Informed patient how to perform the Pursed Lipped breathing technique. Told patient to Inhale through the nose and out the mouth with pursed lips to keep their airways open, help oxygenate them better, practice when at rest or doing strenuous activity. Patient Verbalizes understanding of technique and will work on and be reiterated during [facility]. Informed patient how to perform the Pursed Lipped breathing technique. Told patient to Inhale through the nose and out the mouth with pursed lips to keep their airways open, help oxygenate them better,  practice when at rest or doing strenuous activity. Patient Verbalizes understanding of technique and will work on and be reiterated during [facility].      Goals/Expected Outcomes Short: use PLB with exertion. Long: use PLB on exertion proficiently and independently. Short: use PLB with exertion. Long: use PLB on exertion proficiently and independently.         Oxygen Discharge (Final Oxygen Re-Evaluation):  Oxygen Re-Evaluation - 05/30/24 1409       Program Oxygen Prescription   Program Oxygen Prescription None      Home Oxygen   Home Oxygen Device None    Sleep Oxygen Prescription CPAP    Liters per minute 0    Home Exercise Oxygen Prescription None    Home Resting Oxygen Prescription None    Compliance with Home Oxygen Use Yes      Goals/Expected Outcomes   Short Term Goals To learn and demonstrate proper pursed lip breathing techniques or other breathing techniques.     Long  Term Goals Exhibits proper breathing techniques, such as pursed lip breathing or other method taught during program session    Comments Informed patient how to perform the Pursed Lipped  breathing technique. Told patient to Inhale through the nose and out the mouth with pursed lips to keep their airways open, help oxygenate them better, practice when at rest or doing strenuous activity. Patient Verbalizes understanding of technique and will work on and be reiterated during [facility].    Goals/Expected Outcomes Short: use PLB with exertion. Long: use PLB on exertion proficiently and independently.          Initial Exercise Prescription:  Initial Exercise Prescription - 04/08/24 1000       Date of Initial Exercise RX and Referring Provider   Date 04/08/24    Referring Provider Dr. Jereld Boos, MD      Oxygen   Maintain Oxygen Saturation 88% or higher      Treadmill   MPH 2.5    Grade 0    Minutes 15    METs 2.91      NuStep   Level 2   T6 nustep   SPM 80    Minutes 15    METs 2.11      REL-XR   Level 2    Speed 50    Minutes 15    METs 2.11      Prescription Details   Frequency (times per week) 2    Duration Progress to 30 minutes of continuous aerobic without signs/symptoms of physical distress      Intensity   THRR 40-80% of Max Heartrate 89-123    Ratings of Perceived Exertion 11-13    Perceived Dyspnea 0-4      Progression   Progression Continue to progress workloads to maintain intensity without signs/symptoms of physical distress.      Resistance Training   Training Prescription Yes    Weight 8 lb    Reps 10-15          Perform Capillary Blood Glucose checks as needed.  Exercise Prescription Changes:   Exercise Prescription Changes     Row Name 04/08/24 1000 04/24/24 1100 05/08/24 1400 05/22/24 0800 06/05/24 0900     Response to Exercise   Blood Pressure (Admit) 112/58 110/68 108/68 102/60 102/58   Blood Pressure (Exercise) 148/62 152/74 144/72 144/68 --   Blood Pressure (Exit) 122/58 102/58 110/64 118/54 102/60   Heart Rate (Admit) 55 bpm 63 bpm 56 bpm 61 bpm 52  bpm   Heart Rate (Exercise) 88 bpm 93 bpm 93 bpm 91 bpm 92 bpm    Heart Rate (Exit) 55 bpm 70 bpm 67 bpm 68 bpm 60 bpm   Oxygen Saturation (Admit) 99 % 98 % 98 % 97 % 98 %   Oxygen Saturation (Exercise) 97 % 97 % 96 % 97 % 96 %   Oxygen Saturation (Exit) 99 % 97 % 98 % 98 % 98 %   Rating of Perceived Exertion (Exercise) 12 13 15 13 12    Perceived Dyspnea (Exercise) 2 2 1 2 1    Symptoms none none none none none   Comments Results first 2 weeks of exercise first 2 weeks of exercise -- --   Duration -- Progress to 30 minutes of  aerobic without signs/symptoms of physical distress Progress to 30 minutes of  aerobic without signs/symptoms of physical distress Continue with 30 min of aerobic exercise without signs/symptoms of physical distress. Continue with 30 min of aerobic exercise without signs/symptoms of physical distress.   Intensity -- THRR unchanged THRR unchanged THRR unchanged THRR unchanged     Progression   Progression -- Continue to progress workloads to maintain intensity without signs/symptoms of physical distress. Continue to progress workloads to maintain intensity without signs/symptoms of physical distress. Continue to progress workloads to maintain intensity without signs/symptoms of physical distress. Continue to progress workloads to maintain intensity without signs/symptoms of physical distress.   Average METs -- 3.13 3.39 3.32 3.49     Resistance Training   Training Prescription -- Yes Yes Yes Yes   Weight -- 8 lb 8 lb 8 lb 8 lb   Reps -- 10-15 10-15 10-15 10-15     Interval Training   Interval Training -- No No No No     Treadmill   MPH -- 2.8 2.6 2.6 2.8   Grade -- 0 0 0 0   Minutes -- 15 15 15 15    METs -- 3.14 2.99 2.99 3.14     NuStep   Level -- 3  T6 2  T6 5  T6 nustep 3  T6   Minutes -- 15 15 15 15    METs -- 2.1 2.5 2.6 3.1     REL-XR   Level -- 2 10 5 6    Minutes -- 15 15 15 15    METs -- 4.6 7.2 4.2 5.3     Oxygen   Maintain Oxygen Saturation -- 88% or higher 88% or higher 88% or higher 88% or higher     Row Name 06/05/24 1400 06/19/24 0700           Response to Exercise   Blood Pressure (Admit) -- 104/66      Blood Pressure (Exit) -- 124/68      Heart Rate (Admit) -- 62 bpm      Heart Rate (Exercise) -- 97 bpm      Heart Rate (Exit) -- 71 bpm      Oxygen Saturation (Admit) -- 98 %      Oxygen Saturation (Exercise) -- 96 %      Oxygen Saturation (Exit) -- 97 %      Rating of Perceived Exertion (Exercise) -- 14      Perceived Dyspnea (Exercise) -- 1      Symptoms -- none      Duration Continue with 30 min of aerobic exercise without signs/symptoms of physical distress. Continue with 30 min of aerobic exercise without signs/symptoms of physical distress.  Intensity THRR unchanged THRR unchanged        Progression   Progression Continue to progress workloads to maintain intensity without signs/symptoms of physical distress. Continue to progress workloads to maintain intensity without signs/symptoms of physical distress.      Average METs 3.49 3.6        Resistance Training   Training Prescription Yes Yes      Weight 8 lb 8 lb      Reps 10-15 10-15        Interval Training   Interval Training No No        Treadmill   MPH 2.8 2.8      Grade 0 0      Minutes 15 15      METs 3.14 3.14        NuStep   Level 3  T6 3  T6      Minutes 15 15      METs 3.1 3.7        REL-XR   Level 6 4      Minutes 15 15      METs 5.3 3.1        Home Exercise Plan   Plans to continue exercise at [employer] (comment)  [employer], [place] exercise [place], and walking [employer] (comment)  [employer], [place] exercise class, and walking      Frequency Add 2 additional days to program exercise sessions. Add 2 additional days to program exercise sessions.      Initial Home Exercises Provided 06/05/24 06/05/24        Oxygen   Maintain Oxygen Saturation 88% or higher 88% or higher         Exercise Comments:   Exercise Comments     Row Name 04/10/24 1351           Exercise Comments First  full day of exercise!  Patient was oriented to gym and equipment including functions, settings, policies, and procedures.  Patient's individual exercise prescription and treatment plan were reviewed.  All starting workloads were established based on the results of the 6 minute walk test done at initial orientation visit.  The plan for exercise progression was also introduced and progression will be customized based on patient's performance and goals.          Exercise Goals and Review:   Exercise Goals     Row Name 04/08/24 1030             Exercise Goals   Increase Physical Activity Yes       Intervention Provide advice, education, support and counseling about physical activity/exercise needs.;Develop an individualized exercise prescription for aerobic and resistive training based on initial evaluation findings, risk stratification, comorbidities and participant's personal goals.       Expected Outcomes Short Term: Attend rehab on a regular basis to increase amount of physical activity.;Long Term: Add in home exercise to make exercise part of routine and to increase amount of physical activity.;Long Term: Exercising regularly at least 3-5 days a week.       Increase Strength and Stamina Yes       Intervention Develop an individualized exercise prescription for aerobic and resistive training based on initial evaluation findings, risk stratification, comorbidities and participant's personal goals.;Provide advice, education, support and counseling about physical activity/exercise needs.       Expected Outcomes Short Term: Increase workloads from initial exercise prescription for resistance, speed, and METs.;Short Term: Perform resistance training exercises routinely during rehab  and add in resistance training at home;Long Term: Improve cardiorespiratory fitness, muscular endurance and strength as measured by increased METs and functional capacity ( )       Able to understand and use rate of  perceived exertion (RPE) scale Yes       Intervention Provide education and explanation on how to use RPE scale       Expected Outcomes Short Term: Able to use RPE daily in rehab to express subjective intensity level;Long Term:  Able to use RPE to guide intensity level when exercising independently       Able to understand and use Dyspnea scale Yes       Intervention Provide education and explanation on how to use Dyspnea scale       Expected Outcomes Short Term: Able to use Dyspnea scale daily in rehab to express subjective sense of shortness of breath during exertion;Long Term: Able to use Dyspnea scale to guide intensity level when exercising independently       Knowledge and understanding of Target Heart Rate Range (THRR) Yes       Intervention Provide education and explanation of THRR including how the numbers were predicted and where they are located for reference       Expected Outcomes Short Term: Able to state/look up THRR;Long Term: Able to use THRR to govern intensity when exercising independently;Short Term: Able to use daily as guideline for intensity in rehab       Able to check pulse independently Yes       Intervention Provide education and demonstration on how to check pulse in carotid and radial arteries.;Review the importance of being able to check your own pulse for safety during independent exercise       Expected Outcomes Long Term: Able to check pulse independently and accurately;Short Term: Able to explain why pulse checking is important during independent exercise       Understanding of Exercise Prescription Yes       Intervention Provide education, explanation, and written materials on patient's individual exercise prescription       Expected Outcomes Short Term: Able to explain program exercise prescription;Long Term: Able to explain home exercise prescription to exercise independently          Exercise Goals Re-Evaluation :  Exercise Goals Re-Evaluation     Row Name  04/10/24 1352 04/24/24 1118 05/08/24 1417 05/22/24 0850 06/05/24 0947     Exercise Goal Re-Evaluation   Exercise Goals Review Increase Physical Activity;Knowledge and understanding of Target Heart Rate Range (THRR);Able to understand and use rate of perceived exertion (RPE) scale;Understanding of Exercise Prescription;Increase Strength and Stamina;Able to check pulse independently Increase Physical Activity;Increase Strength and Stamina;Understanding of Exercise Prescription Increase Physical Activity;Increase Strength and Stamina;Understanding of Exercise Prescription Increase Physical Activity;Increase Strength and Stamina;Understanding of Exercise Prescription Increase Physical Activity;Increase Strength and Stamina;Understanding of Exercise Prescription   Comments Reviewed RPE and dyspnea scale, THR and program prescription with pt today.  Pt voiced understanding and was given a copy of goals to take home. Taj is off to a good start in the program. He was able to attend his first 4 sessions during this review period. During his few sessions he was able to use the treadmill at a speed of 2. and no incline. He was also able to increase to level 3 on the T6 nustep. We will continue to monitor his progress in the program. Chelsey continues to do well in the program. He was recently able to increase from  level 2 to 10 on the XR. He was also able to use the Treadmill at a workload of 2. and no incline. We will continue to monitor his progress in the program. Clarice is doing well in rehab. He continues to do well on the treadmill at a speed of 2.6 mph with no incline. He also improved to level 5 on the T6 nustep and continues to use 8 lb handweights for resistance training. We will continue to monitor his progress in the program. Irvan continues to do well in rehab. He was recently able to increase his speed on the treadmill from 2.6 to 2.8 mph with no incline. He was also able to maintain level 3 on  the T6 nustep. We will continue to monitor his progress in the program.   Expected Outcomes Short: Use RPE daily to regulate intensity.  Long: Follow program prescription in THR. Short: Continue to follow exercise prescription. Long: Continue exercise to improve strength and stamina. Short: Continue to follow exercise prescription. Long: Continue exercise to improve strength and stamina. Short: Continue to progressively increase treadmill workload. Long: Continue exercise to improve strength and stamina. Short: Continue to progressively increase treadmill workload. Long: Continue exercise to improve strength and stamina.    Row Name 06/05/24 1400 06/19/24 0754           Exercise Goal Re-Evaluation   Exercise Goals Review Increase Physical Activity;Able to understand and use rate of perceived exertion (RPE) scale;Knowledge and understanding of Target Heart Rate Range (THRR);Understanding of Exercise Prescription;Increase Strength and Stamina;Able to understand and use Dyspnea scale;Able to check pulse independently Increase Physical Activity;Increase Strength and Stamina;Understanding of Exercise Prescription      Comments Reviewed home exercise with pt today from 1:50 pm to 2:00 pm.  Pt plans to go to the [place], [place] exercise class, and walk for exercise.  Reviewed THR, pulse, RPE, sign and symptoms, pulse oximetery and when to call 911 or MD.  Also discussed weather considerations and indoor options.  Pt voiced understanding. Sahand is doing well in rehab. He continues to use the treadmill at a speed of 2. and no incline, and the T6 nustep at level 3. We will continue to encourage and monitor his progress in the program.      Expected Outcomes Short: add 1-2 days a week of exercise outside of pulmonary rehab. Long: maintain independent exercise routine upon graduation from rehab. Short: Add an incline of 1% to his treadmill workload. Long: Continue exercise to improve strength and stamina.          Discharge Exercise Prescription (Final Exercise Prescription Changes):  Exercise Prescription Changes - 06/19/24 0700       Response to Exercise   Blood Pressure (Admit) 104/66    Blood Pressure (Exit) 124/68    Heart Rate (Admit) 62 bpm    Heart Rate (Exercise) 97 bpm    Heart Rate (Exit) 71 bpm    Oxygen Saturation (Admit) 98 %    Oxygen Saturation (Exercise) 96 %    Oxygen Saturation (Exit) 97 %    Rating of Perceived Exertion (Exercise) 14    Perceived Dyspnea (Exercise) 1    Symptoms none    Duration Continue with 30 min of aerobic exercise without signs/symptoms of physical distress.    Intensity THRR unchanged      Progression   Progression Continue to progress workloads to maintain intensity without signs/symptoms of physical distress.    Average METs 3.6      Resistance  Training   Training Prescription Yes    Weight 8 lb    Reps 10-15      Interval Training   Interval Training No      Treadmill   MPH 2.8    Grade 0    Minutes 15    METs 3.14      NuStep   Level 3   T6   Minutes 15    METs 3.7      REL-XR   Level 4    Minutes 15    METs 3.1      Home Exercise Plan   Plans to continue exercise at [employer] (comment)   [employer], [place] exercise class, and walking   Frequency Add 2 additional days to program exercise sessions.    Initial Home Exercises Provided 06/05/24      Oxygen   Maintain Oxygen Saturation 88% or higher          Nutrition:  Target Goals: Understanding of nutrition guidelines, daily intake of sodium 1500mg , cholesterol 200mg , calories 30% from fat and 7% or less from saturated fats, daily to have 5 or more servings of fruits and vegetables.  Education: All About Nutrition: -Group instruction provided by verbal, written material, interactive activities, discussions, models, and posters to present general guidelines for heart healthy nutrition including fat, fiber, MyPlate, the role of sodium in heart healthy nutrition,  utilization of the nutrition label, and utilization of this knowledge for meal planning. Follow up email sent as well. Written material given at graduation. Flowsheet Row Pulmonary Rehab from 06/12/2024 in [facility] Cardiac and Pulmonary Rehab  Date 04/24/24  Educator JG part 1  [part 2 6/11]  Instruction Review Code 1- Verbalizes Understanding    Biometrics:  Pre Biometrics - 04/08/24 1031       Pre Biometrics   Height 6' 0.75 (1.848 m)    Weight 217 lb 3.2 oz (98.5 kg)    Waist Circumference 39.5 inches    Hip Circumference 43 inches    Waist to Hip Ratio 0.92 %    BMI (Calculated) 28.85    Single Leg Stand 2.9 seconds           Nutrition Therapy Plan and Nutrition Goals:  Nutrition Therapy & Goals - 04/08/24 1015       Nutrition Therapy   Diet Mediterranean    Protein (specify units) 70-90g    Fiber 30 grams    Whole Grain Foods 3 servings    Saturated Fats 15 max. grams    Fruits and Vegetables 5 servings/day    Sodium 2 grams      Personal Nutrition Goals   Nutrition Goal Eat 15-30gProtein and 30-60gCarbs at each meal.    Personal Goal #2 Eat 3 times per day, small frequent meals or nutrient dense snacks    Comments Patient drinking ~48oz of water  daily. He reports he usually eats 3 meals per day. Sometimes will miss lunch if he busy doing something. Encouraged him to have a snack if he thinks he might miss a meal. He agrees he should do that. Brainstormed several meal and snack ideas focusing on balancing carbs with protein or healthy fats. Provided mediterranean diet handout. Educated on the importance of meeting his nutritional goals with COPD.      Intervention Plan   Intervention Prescribe, educate and counsel regarding individualized specific dietary modifications aiming towards targeted core components such as weight, hypertension, lipid management, diabetes, heart failure and other comorbidities.;Nutrition handout(s) given to  patient.    Expected Outcomes Long  Term Goal: Adherence to prescribed nutrition plan.;Short Term Goal: A plan has been developed with personal nutrition goals set during dietitian appointment.;Short Term Goal: Understand basic principles of dietary content, such as calories, fat, sodium, cholesterol and nutrients.          Nutrition Assessments:  MEDIFICTS Score Key: >=70 Need to make dietary changes  40-70 Heart Healthy Diet <= 40 Therapeutic Level Cholesterol Diet  Flowsheet Row Pulmonary Rehab from 04/08/2024 in [facility] Cardiac and Pulmonary Rehab  Picture Your Plate Total Score on Admission 75   Picture Your Plate Scores: <59 Unhealthy dietary pattern with much room for improvement. 41-50 Dietary pattern unlikely to meet recommendations for good health and room for improvement. 51-60 More healthful dietary pattern, with some room for improvement.  >60 Healthy dietary pattern, although there may be some specific behaviors that could be improved.   Nutrition Goals Re-Evaluation:  Nutrition Goals Re-Evaluation     Row Name 05/16/24 1425 05/30/24 1402           Goals   Comment Patient was informed on why it is important to maintain a balanced diet when dealing with Respiratory issues. Explained that it takes a lot of energy to breath and when they are short of breath often they will need to have a good diet to help keep up with the calories they are expending for breathing. Jaydyn is still trying to work on increasing his fiber intake, as well as his protein.      Expected Outcome Short: Choose and plan snacks accordingly to patients caloric intake to improve breathing. Long: Maintain a diet independently that meets their caloric intake to aid in daily shortness of breath. Short: Continue to increase fiber and protein intake. Long: Continue to manage his diet in order to manage his CKD.         Nutrition Goals Discharge (Final Nutrition Goals Re-Evaluation):  Nutrition Goals Re-Evaluation - 05/30/24 1402        Goals   Comment Richey is still trying to work on increasing his fiber intake, as well as his protein.    Expected Outcome Short: Continue to increase fiber and protein intake. Long: Continue to manage his diet in order to manage his CKD.          Psychosocial: Target Goals: Acknowledge presence or absence of significant depression and/or stress, maximize coping skills, provide positive support system. Participant is able to verbalize types and ability to use techniques and skills needed for reducing stress and depression.   Education: Stress, Anxiety, and Depression - Group verbal and visual presentation to define topics covered.  Reviews how body is impacted by stress, anxiety, and depression.  Also discusses healthy ways to reduce stress and to treat/manage anxiety and depression.  Written material given at graduation.   Education: Sleep Hygiene -Provides group verbal and written instruction about how sleep can affect your health.  Define sleep hygiene, discuss sleep cycles and impact of sleep habits. Review good sleep hygiene tips.    Initial Review & Psychosocial Screening:  Initial Psych Review & Screening - 04/03/24 1021       Initial Review   Current issues with History of Depression      Family Dynamics   Good Support System? Yes   family, friends     Barriers   Psychosocial barriers to participate in program There are no identifiable barriers or psychosocial needs.;The patient should benefit from training in stress management  and relaxation.      Screening Interventions   Interventions Encouraged to exercise    Expected Outcomes Short Term goal: Utilizing psychosocial counselor, staff and physician to assist with identification of specific Stressors or current issues interfering with healing process. Setting desired goal for each stressor or current issue identified.;Long Term Goal: Stressors or current issues are controlled or eliminated.;Short Term goal: Identification  and review with participant of any Quality of Life or Depression concerns found by scoring the questionnaire.;Long Term goal: The participant improves quality of Life and PHQ9 Scores as seen by post scores and/or verbalization of changes          Quality of Life Scores:  Scores of 19 and below usually indicate a poorer quality of life in these areas.  A difference of  2-3 points is a clinically meaningful difference.  A difference of 2-3 points in the total score of the Quality of Life Index has been associated with significant improvement in overall quality of life, self-image, physical symptoms, and general health in studies assessing change in quality of life.  PHQ-9: Review Flowsheet       05/16/2024 04/08/2024  Depression screen PHQ 2/9  Decreased Interest 0 1  Down, Depressed, Hopeless 1 2  PHQ - 2 Score 1 3  Altered sleeping 1 0  Tired, decreased energy 1 1  Change in appetite 1 2  Feeling bad or failure about yourself  0 0  Trouble concentrating 1 1  Moving slowly or fidgety/restless 0 1  Suicidal thoughts 0 0  PHQ-9 Score 5 8  Difficult doing work/chores Somewhat difficult Somewhat difficult   Interpretation of Total Score  Total Score Depression Severity:  1-4 = Minimal depression, 5-9 = Mild depression, 10-14 = Moderate depression, 15-19 = Moderately severe depression, 20-27 = Severe depression   Psychosocial Evaluation and Intervention:  Psychosocial Evaluation - 04/03/24 1024       Psychosocial Evaluation & Interventions   Interventions Encouraged to exercise with the program and follow exercise prescription    Comments Mr. Grafton is coming to Pulmonary Rehab with COPD. He states he has no concerns currently with stress or anxiety/depression, but does have a history of PTSD. He has a great support system and feels like he is managing his health well. He is interested in learning more about risk factor management and increasing his stamina while in the program.     Expected Outcomes Short: attend pulmonary rehab for exercise and education. Long; develop and maintain positive self care habits    Continue Psychosocial Services  Follow up required by staff          Psychosocial Re-Evaluation:  Psychosocial Re-Evaluation     Row Name 05/16/24 1426 05/30/24 1359           Psychosocial Re-Evaluation   Current issues with None Identified None Identified      Comments Reviewed patient health questionnaire (PHQ-9) with patient for follow up. Previously, patients score indicated signs/symptoms of depression.  Reviewed to see if patient is improving symptom wise while in program.  Score improved and patient states that it is because he has been able to exerise more. Klye is doing well in the program. He states that he has no stressors at this time. He has a good support system made up of his daughter and wife. Exercise is a good outlet for Timoth, and has helped with him deal with his stress if he has any.      Expected Outcomes Short:  Continue to attend [facility] regularly for regular exercise and social engagement. Long: Continue to improve symptoms and manage a positive mental state. Short: Continue to attend [facility] regularly for regular exercise and social engagement. Long: Continue to improve symptoms and manage a positive mental state.      Interventions Encouraged to attend Pulmonary Rehabilitation for the exercise Encouraged to attend Pulmonary Rehabilitation for the exercise      Continue Psychosocial Services  Follow up required by staff Follow up required by staff         Psychosocial Discharge (Final Psychosocial Re-Evaluation):  Psychosocial Re-Evaluation - 05/30/24 1359       Psychosocial Re-Evaluation   Current issues with None Identified    Comments Jlen is doing well in the program. He states that he has no stressors at this time. He has a good support system made up of his daughter and wife. Exercise is a good outlet for Syaire,  and has helped with him deal with his stress if he has any.    Expected Outcomes Short: Continue to attend [facility] regularly for regular exercise and social engagement. Long: Continue to improve symptoms and manage a positive mental state.    Interventions Encouraged to attend Pulmonary Rehabilitation for the exercise    Continue Psychosocial Services  Follow up required by staff          Education: Education Goals: Education classes will be provided on a weekly basis, covering required topics. Participant will state understanding/return demonstration of topics presented.  Learning Barriers/Preferences:  Learning Barriers/Preferences - 04/03/24 1020       Learning Barriers/Preferences   Learning Barriers None    Learning Preferences None          General Pulmonary Education Topics:  Infection Prevention: - Provides verbal and written material to individual with discussion of infection control including proper hand washing and proper equipment cleaning during exercise session. Flowsheet Row Pulmonary Rehab from 06/12/2024 in [facility] Cardiac and Pulmonary Rehab  Date 04/08/24  Educator NT  Instruction Review Code 1- Verbalizes Understanding    Falls Prevention: - Provides verbal and written material to individual with discussion of falls prevention and safety. Flowsheet Row Pulmonary Rehab from 06/12/2024 in [facility] Cardiac and Pulmonary Rehab  Date 04/08/24  Educator NT  Instruction Review Code 1- Verbalizes Understanding    Chronic Lung Disease Review: - Group verbal instruction with posters, models, PowerPoint presentations and videos,  to review new updates, new respiratory medications, new advancements in procedures and treatments. Providing information on websites and 800 numbers for continued self-education. Includes information about supplement oxygen, available portable oxygen systems, continuous and intermittent flow rates, oxygen safety, concentrators, and Medicare  reimbursement for oxygen. Explanation of Pulmonary Drugs, including class, frequency, complications, importance of spacers, rinsing mouth after steroid MDI's, and proper cleaning methods for nebulizers. Review of basic lung anatomy and physiology related to function, structure, and complications of lung disease. Review of risk factors. Discussion about methods for diagnosing sleep apnea and types of masks and machines for OSA. Includes a review of the use of types of environmental controls: home humidity, furnaces, filters, dust mite/pet prevention, HEPA vacuums. Discussion about weather changes, air quality and the benefits of nasal washing. Instruction on Warning signs, infection symptoms, calling MD promptly, preventive modes, and value of vaccinations. Review of effective airway clearance, coughing and/or vibration techniques. Emphasizing that all should Create an Action Plan. Written material given at graduation. Flowsheet Row Pulmonary Rehab from 06/12/2024 in [facility] Cardiac and Pulmonary Rehab  Education need identified 04/08/24    AED/CPR: - Group verbal and written instruction with the use of models to demonstrate the basic use of the AED with the basic ABC's of resuscitation.    Anatomy and Cardiac Procedures: - Group verbal and visual presentation and models provide information about basic cardiac anatomy and function. Reviews the testing methods done to diagnose heart disease and the outcomes of the test results. Describes the treatment choices: Medical Management, Angioplasty, or Coronary Bypass Surgery for treating various heart conditions including Myocardial Infarction, Angina, Valve Disease, and Cardiac Arrhythmias.  Written material given at graduation. Flowsheet Row Pulmonary Rehab from 06/12/2024 in [facility] Cardiac and Pulmonary Rehab  Date 05/08/24  Educator SB  Instruction Review Code 1- Verbalizes Understanding    Medication Safety: - Group verbal and visual instruction to review  commonly prescribed medications for heart and lung disease. Reviews the medication, class of the drug, and side effects. Includes the steps to properly store meds and maintain the prescription regimen.  Written material given at graduation.   Other: -Provides group and verbal instruction on various topics (see comments)   Knowledge Questionnaire Score:  Knowledge Questionnaire Score - 04/08/24 1020       Knowledge Questionnaire Score   Pre Score 15/18           Core Components/Risk Factors/Patient Goals at Admission:  Personal Goals and Risk Factors at Admission - 04/03/24 1019       Core Components/Risk Factors/Patient Goals on Admission    Weight Management Yes;Weight Maintenance    Intervention Weight Management: Develop a combined nutrition and exercise program designed to reach desired caloric intake, while maintaining appropriate intake of nutrient and fiber, sodium and fats, and appropriate energy expenditure required for the weight goal.;Weight Management: Provide education and appropriate resources to help participant work on and attain dietary goals.;Weight Management/Obesity: Establish reasonable short term and long term weight goals.    Expected Outcomes Weight Maintenance: Understanding of the daily nutrition guidelines, which includes 25-35% calories from fat, 7% or less cal from saturated fats, less than 200mg  cholesterol, less than 1.5gm of sodium, & 5 or more servings of fruits and vegetables daily;Long Term: Adherence to nutrition and physical activity/exercise program aimed toward attainment of established weight goal;Short Term: Continue to assess and modify interventions until short term weight is achieved    Improve shortness of breath with ADL's Yes    Intervention Provide education, individualized exercise plan and daily activity instruction to help decrease symptoms of SOB with activities of daily living.    Expected Outcomes Short Term: Improve cardiorespiratory  fitness to achieve a reduction of symptoms when performing ADLs;Long Term: Be able to perform more ADLs without symptoms or delay the onset of symptoms    Hypertension Yes    Intervention Provide education on lifestyle modifcations including regular physical activity/exercise, weight management, moderate sodium restriction and increased consumption of fresh fruit, vegetables, and low fat dairy, alcohol  moderation, and smoking cessation.;Monitor prescription use compliance.    Expected Outcomes Short Term: Continued assessment and intervention until BP is < 140/31mm HG in hypertensive participants. < 130/67mm HG in hypertensive participants with diabetes, heart failure or chronic kidney disease.;Long Term: Maintenance of blood pressure at goal levels.    Lipids Yes    Intervention Provide education and support for participant on nutrition & aerobic/resistive exercise along with prescribed medications to achieve LDL 70mg , HDL >40mg .    Expected Outcomes Short Term: Participant states understanding of desired cholesterol values and is compliant  with medications prescribed. Participant is following exercise prescription and nutrition guidelines.;Long Term: Cholesterol controlled with medications as prescribed, with individualized exercise RX and with personalized nutrition plan. Value goals: LDL < 70mg , HDL > 40 mg.          Education:Diabetes - Individual verbal and written instruction to review signs/symptoms of diabetes, desired ranges of glucose level fasting, after meals and with exercise. Acknowledge that pre and post exercise glucose checks will be done for 3 sessions at entry of program.   Know Your Numbers and Heart Failure: - Group verbal and visual instruction to discuss disease risk factors for cardiac and pulmonary disease and treatment options.  Reviews associated critical values for Overweight/Obesity, Hypertension, Cholesterol, and Diabetes.  Discusses basics of heart failure:  signs/symptoms and treatments.  Introduces Heart Failure Zone chart for action plan for heart failure.  Written material given at graduation. Flowsheet Row Pulmonary Rehab from 06/12/2024 in [facility] Cardiac and Pulmonary Rehab  Date 05/22/24  Educator SB  Instruction Review Code 1- Verbalizes Understanding    Core Components/Risk Factors/Patient Goals Review:   Goals and Risk Factor Review     Row Name 05/16/24 1424 05/30/24 1405           Core Components/Risk Factors/Patient Goals Review   Personal Goals Review Weight Management/Obesity Weight Management/Obesity;Improve shortness of breath with ADL's      Review Spoke to patient about their shortness of breath and what they can do to improve. Patient has been informed of breathing techniques when starting the program. Patient is informed to tell staff if they have had any med changes and that certain meds they are taking or not taking can be causing shortness of breath. Lumir continues to work to maintain his weight. He states that he is trying to keep his belly fat under control. He Continues to see improvements with his shortness of breath.      Expected Outcomes Short: Attend [facility] regularly to improve shortness of breath with ADL's. Long: maintain independence with ADL's Short: Attend [facility] regularly to improve shortness of breath with ADL's. Long: maintain independence with ADL's         Core Components/Risk Factors/Patient Goals at Discharge (Final Review):   Goals and Risk Factor Review - 05/30/24 1405       Core Components/Risk Factors/Patient Goals Review   Personal Goals Review Weight Management/Obesity;Improve shortness of breath with ADL's    Review Gerrett continues to work to maintain his weight. He states that he is trying to keep his belly fat under control. He Continues to see improvements with his shortness of breath.    Expected Outcomes Short: Attend [facility] regularly to improve shortness of breath with  ADL's. Long: maintain independence with ADL's          ITP Comments:  ITP Comments     Row Name 04/03/24 1026 04/08/24 1028 04/10/24 1351 04/24/24 1114 05/22/24 1132   ITP Comments Initial phone call completed. Diagnosis can be found in [facility] 5/7. EP Orientation scheduled for Monday 5/19 at 9am. Completed and gym orientation for pulmonary rehab. Initial ITP created and sent for review to Dr. Faud Aleskerov, Medical Director. First full day of exercise!  Patient was oriented to gym and equipment including functions, settings, policies, and procedures.  Patient's individual exercise prescription and treatment plan were reviewed.  All starting workloads were established based on the results of the 6 minute walk test done at initial orientation visit.  The plan for exercise progression was also  introduced and progression will be customized based on patient's performance and goals. 30 Day review completed. Medical Director ITP review done, changes made as directed, and signed approval by Medical Director.    new to program 30 Day review completed. Medical Director ITP review done, changes made as directed, and signed approval by Medical Director.    Row Name 06/19/24 1019           ITP Comments 30 Day review completed. Medical Director ITP review done, changes made as directed, and signed approval by Medical Director.          Comments: 30 day review

## 2024-06-19 NOTE — Progress Notes (Signed)
 Daily Session Note  Patient Details  Name: Wesley Weber MRN: [id number] Date of Birth: May 17, 1946 Referring Provider:   [name] from 04/08/2024 in [facility] Cardiac and Pulmonary Rehab  Referring Provider Dr. Jereld Boos, MD    Encounter Date: 06/19/2024  Check In:  Session Check In - 06/19/24 1430       Check-In   Supervising physician immediately available to respond to emergencies See telemetry face sheet for immediately available ER MD    Location [facility]-Cardiac & Pulmonary Rehab    Staff Present Hoy Rodney RN,BSN;Kelly Dyane BS, ACSM CEP, Exercise Physiologist;Noah Tickle, BS, Exercise Physiologist;Kelly [facility]    Virtual Visit No    Medication changes reported     No    Warm-up and Cool-down Performed on first and last piece of equipment    Resistance Training Performed Yes    VAD Patient? No    PAD/SET Patient? No      Pain Assessment   Currently in Pain? No/denies             Social History   Tobacco Use  Smoking Status Former   Current packs/day: 0.00   Types: Cigarettes   Start date: 09/06/1976   Quit date: 09/06/1986   Years since quitting: 37.8  Smokeless Tobacco Never    Goals Met:  Independence with exercise equipment Exercise tolerated well No report of concerns or symptoms today Strength training completed today  Goals Unmet:  Not Applicable  Comments: Pt able to follow exercise prescription today without complaint.  Will continue to monitor for progression.    Dr. Oneil Pinal is Medical Director for [facility] Cardiac Rehabilitation.  Dr. Fuad Aleskerov is Medical Director for [facility] Pulmonary Rehabilitation.

## 2024-06-20 ENCOUNTER — Encounter: Admitting: *Deleted

## 2024-06-20 DIAGNOSIS — J449 Chronic obstructive pulmonary disease, unspecified: Secondary | ICD-10-CM | POA: Diagnosis not present

## 2024-06-20 NOTE — Progress Notes (Signed)
 Daily Session Note  Patient Details  Name: Wesley Weber MRN: [id number] Date of Birth: 17-Aug-1946 Referring Provider:   [name] from 04/08/2024 in [facility] Cardiac and Pulmonary Rehab  Referring Provider Dr. Jereld Boos, MD    Encounter Date: 06/20/2024  Check In:  Session Check In - 06/20/24 1357       Check-In   Supervising physician immediately available to respond to emergencies See telemetry face sheet for immediately available ER MD    Location [facility]-Cardiac & Pulmonary Rehab    Staff Present Hoy Rodney RN,BSN;[facility] BS, Exercise Physiologist;Noah Tickle, BS, Exercise Physiologist    Virtual Visit No    Medication changes reported     No    Warm-up and Cool-down Performed on first and last piece of equipment    Resistance Training Performed Yes    VAD Patient? No    PAD/SET Patient? No      Pain Assessment   Currently in Pain? No/denies             Social History   Tobacco Use  Smoking Status Former   Current packs/day: 0.00   Types: Cigarettes   Start date: 09/06/1976   Quit date: 09/06/1986   Years since quitting: 37.8  Smokeless Tobacco Never    Goals Met:  Independence with exercise equipment Exercise tolerated well No report of concerns or symptoms today Strength training completed today  Goals Unmet:  Not Applicable  Comments: Pt able to follow exercise prescription today without complaint.  Will continue to monitor for progression.    Dr. Oneil Pinal is Medical Director for [facility] Cardiac Rehabilitation.  Dr. Fuad Aleskerov is Medical Director for [facility] Pulmonary Rehabilitation.

## 2024-06-25 ENCOUNTER — Emergency Department: Admission: EM | Admit: 2024-06-25 | Discharge: 2024-06-25 | Disposition: A | Discharge status: Home / Self Care

## 2024-06-25 ENCOUNTER — Emergency Department

## 2024-06-25 DIAGNOSIS — J45909 Unspecified asthma, uncomplicated: Secondary | ICD-10-CM | POA: Insufficient documentation

## 2024-06-25 DIAGNOSIS — K5900 Constipation, unspecified: Secondary | ICD-10-CM | POA: Insufficient documentation

## 2024-06-25 DIAGNOSIS — Z8546 Personal history of malignant neoplasm of prostate: Secondary | ICD-10-CM | POA: Diagnosis not present

## 2024-06-25 DIAGNOSIS — I10 Essential (primary) hypertension: Secondary | ICD-10-CM | POA: Insufficient documentation

## 2024-06-25 DIAGNOSIS — R1030 Lower abdominal pain, unspecified: Secondary | ICD-10-CM | POA: Diagnosis present

## 2024-06-25 LAB — URINALYSIS, ROUTINE W REFLEX MICROSCOPIC
Bacteria, UA: NONE SEEN
Bilirubin Urine: NEGATIVE
Glucose, UA: NEGATIVE mg/dL
Hgb urine dipstick: NEGATIVE
Ketones, ur: NEGATIVE mg/dL
Nitrite: NEGATIVE
Protein, ur: NEGATIVE mg/dL
Specific Gravity, Urine: 1.008 (ref 1.005–1.030)
pH: 6 (ref 5.0–8.0)

## 2024-06-25 LAB — COMPREHENSIVE METABOLIC PANEL WITH GFR
ALT: 23 U/L (ref 0–44)
AST: 30 U/L (ref 15–41)
Albumin: 3.9 g/dL (ref 3.5–5.0)
Alkaline Phosphatase: 61 U/L (ref 38–126)
Anion gap: 12 (ref 5–15)
BUN: 17 mg/dL (ref 8–23)
CO2: 23 mmol/L (ref 22–32)
Calcium: 9.8 mg/dL (ref 8.9–10.3)
Chloride: 102 mmol/L (ref 98–111)
Creatinine, Ser: 1.35 mg/dL — ABNORMAL HIGH (ref 0.61–1.24)
GFR, Estimated: 53 mL/min — ABNORMAL LOW (ref >60)
Glucose, Bld: 122 mg/dL — ABNORMAL HIGH (ref 70–99)
Potassium: 4 mmol/L (ref 3.5–5.1)
Sodium: 137 mmol/L (ref 135–145)
Total Bilirubin: 1 mg/dL (ref 0.0–1.2)
Total Protein: 7.4 g/dL (ref 6.5–8.1)

## 2024-06-25 LAB — CBC WITH DIFFERENTIAL/PLATELET
Abs Immature Granulocytes: 0.01 K/uL (ref 0.00–0.07)
Basophils Absolute: 0 K/uL (ref 0.0–0.1)
Basophils Relative: 1 %
Eosinophils Absolute: 0.2 K/uL (ref 0.0–0.5)
Eosinophils Relative: 4 %
HCT: 40.8 % (ref 39.0–52.0)
Hemoglobin: 13.5 g/dL (ref 13.0–17.0)
Immature Granulocytes: 0 %
Lymphocytes Relative: 24 %
Lymphs Abs: 1.1 K/uL (ref 0.7–4.0)
MCH: 29.7 pg (ref 26.0–34.0)
MCHC: 33.1 g/dL (ref 30.0–36.0)
MCV: 89.7 fL (ref 80.0–100.0)
Monocytes Absolute: 0.5 K/uL (ref 0.1–1.0)
Monocytes Relative: 10 %
Neutro Abs: 2.8 K/uL (ref 1.7–7.7)
Neutrophils Relative %: 61 %
Platelets: 209 K/uL (ref 150–400)
RBC: 4.55 MIL/uL (ref 4.22–5.81)
RDW: 12.6 % (ref 11.5–15.5)
WBC: 4.6 K/uL (ref 4.0–10.5)
nRBC: 0 % (ref 0.0–0.2)

## 2024-06-25 MED ORDER — SODIUM CHLORIDE 0.9 % IV BOLUS
1000.0000 mL | Freq: Once | INTRAVENOUS | Status: AC
  Administered 2024-06-25: 1000 mL via INTRAVENOUS

## 2024-06-25 MED ORDER — FLEET ENEMA RE ENEM: 1.0000 | ENEMA | Freq: Every day | RECTAL | 0 refills | Status: AC | PRN

## 2024-06-25 MED ORDER — IOHEXOL 300 MG/ML  SOLN
100.0000 mL | Freq: Once | INTRAMUSCULAR | Status: AC | PRN
  Administered 2024-06-25: 100 mL via INTRAVENOUS

## 2024-06-25 MED ORDER — POLYETHYLENE GLYCOL 3350 17 G PO PACK: 17.0000 g | PACK | Freq: Every day | ORAL | 2 refills | Status: AC

## 2024-06-25 NOTE — ED Notes (Signed)
 Patient ambulatory to STAT desk with steady gait, without difficulty or distress noted; st he was outside

## 2024-06-25 NOTE — ED Provider Notes (Incomplete)
 [facility] Provider Note    None    (approximate)   History   Abdominal Pain  Pt c/o lower abdominal pain radiating into testicles x1 week.  Pain score 4/10.  Hx of prostate CA and sts he has had half of his prostate removed.  No current treatment.  Denies dysuria.    HPI DEANDREA RION is a 78 y.o. male  ***       Physical Exam   Triage Vital Signs: ED Triage Vitals  Encounter Vitals Group     BP 06/25/24 1803 (!) 155/81     Girls Systolic BP Percentile --      Girls Diastolic BP Percentile --      Boys Systolic BP Percentile --      Boys Diastolic BP Percentile --      Pulse Rate 06/25/24 1803 64     Resp 06/25/24 1803 18     Temp 06/25/24 1803 98.3 F (36.8 C)     Temp Source 06/25/24 1803 Oral     SpO2 06/25/24 1803 100 %     Weight 06/25/24 1804 217 lb (98.4 kg)     Height 06/25/24 1804 6' (1.829 m)     Head Circumference --      Peak Flow --      Pain Score 06/25/24 1804 4     Pain Loc --      Pain Education --      Exclude from Growth Chart --     Most recent vital signs: Vitals:   06/25/24 1803  BP: (!) 155/81  Pulse: 64  Resp: 18  Temp: 98.3 F (36.8 C)  SpO2: 100%    {Only need to document appropriate and relevant physical exam:1} General: Awake, no distress. *** CV:  Good peripheral perfusion. RRR, RP 2+*** Resp:  Normal effort. CTAB*** Abd:  No distention. Nontender to deep palpation throughout*** Other:  ***   ED Results / Procedures / Treatments   Labs (all labs ordered are listed, but only abnormal results are displayed) Labs Reviewed  URINALYSIS, ROUTINE W REFLEX MICROSCOPIC - Abnormal; Notable for the following components:      Result Value   Color, Urine YELLOW (*)    APPearance CLEAR (*)    Leukocytes,Ua TRACE (*)    All other components within normal limits  CBC WITH DIFFERENTIAL/PLATELET  COMPREHENSIVE METABOLIC PANEL WITH GFR     EKG  ***   RADIOLOGY *** {USE THE WORD  INTERPRETED!! You MUST document your own interpretation of imaging, as well as the fact that you reviewed the radiologist's report!:1}   PROCEDURES:  Critical Care performed: {CriticalCareYesNo:19197::Yes, see critical care procedure note(s),No}  Procedures   MEDICATIONS ORDERED IN ED: Medications - No data to display   IMPRESSION / MDM / ASSESSMENT AND PLAN / ED COURSE  I reviewed the triage vital signs and the nursing notes.                              DDX/MDM/AP: Differential diagnosis includes, but is not limited to, ***  Plan: - ***  Patient's presentation is most consistent with {EM COPA:27473}  ***The patient is on the cardiac monitor to evaluate for evidence of arrhythmia and/or significant heart rate changes.***  ED course below. ***  Clinical Course as of 06/25/24 2218  Tue Jun 25, 2024  2122 WBC: 4.6 No leukocytosis [HD]  2122 Urinalysis, Routine w reflex  microscopic -Urine, Clean Catch(!) No evidence of UTI [HD]  2205 Creatinine(!): 1.35 Creatinine at patient's baseline [HD]    Clinical Course User Index [HD] Nicholaus Rolland BRAVO, MD     FINAL CLINICAL IMPRESSION(S) / ED DIAGNOSES   Final diagnoses:  None     Rx / DC Orders   ED Discharge Orders     None        Note:  This document was prepared using Dragon voice recognition software and may include unintentional dictation errors.

## 2024-06-25 NOTE — ED Provider Notes (Signed)
 [facility] Provider Note    Event Date/Time   First MD Initiated Contact with Patient 06/25/24 2122     (approximate)   History   Abdominal Pain   HPI  Wesley Weber is a 78 y.o. male with a past medical history of prostate cancer status post TURP in remission, asthma, hypertension who presents with suprapubic pain that radiates to his right scrotum for the past week.  He denies any dysuria or hematuria.  He reports regular bowel movements with his last one this morning which was unremarkable.  He denies any chest pain or shortness of breath.  He does have a primary care physician and is scheduled to see urology next week      Physical Exam   Triage Vital Signs: ED Triage Vitals  Encounter Vitals Group     BP 06/25/24 1803 (!) 155/81     Girls Systolic BP Percentile --      Girls Diastolic BP Percentile --      Boys Systolic BP Percentile --      Boys Diastolic BP Percentile --      Pulse Rate 06/25/24 1803 64     Resp 06/25/24 1803 18     Temp 06/25/24 1803 98.3 F (36.8 C)     Temp Source 06/25/24 1803 Oral     SpO2 06/25/24 1803 100 %     Weight 06/25/24 1804 217 lb (98.4 kg)     Height 06/25/24 1804 6' (1.829 m)     Head Circumference --      Peak Flow --      Pain Score 06/25/24 1804 4     Pain Loc --      Pain Education --      Exclude from Growth Chart --     Most recent vital signs: Vitals:   06/25/24 2200 06/25/24 2230  BP: 127/78 123/70  Pulse: (!) 57 60  Resp: 17 17  Temp:    SpO2: 97% 100%    Nursing Triage Note reviewed. Vital signs reviewed and patients oxygen saturation is normoxic  General: Patient is well nourished, well developed, awake and alert, resting comfortably in no acute distress Head: Normocephalic and atraumatic Eyes: Normal inspection, extraocular muscles intact, no conjunctival pallor Ear, nose, throat: Normal external exam Neck: Normal range of motion Respiratory: Patient is in no respiratory  distress, lungs CTAB Cardiovascular: Patient is not tachycardic, RRR without murmur appreciated GI: Abd Soft, only very mildly tender in suprapubic area with no guarding or rebound  GU: Done with nurse chaperone in the room and there is no penile abnormality or scrotal abnormality Back: Normal inspection of the back with good strength and range of motion throughout all ext Extremities: pulses intact with good cap refills, no LE pitting edema or calf tenderness Neuro: The patient is alert and oriented to person, place, and time, appropriately conversive, with 5/5 bilat UE/LE strength, no gross motor or sensory defects noted. Coordination appears to be adequate. Skin: Warm, dry, and intact Psych: normal mood and affect, no SI or HI  ED Results / Procedures / Treatments   Labs (all labs ordered are listed, but only abnormal results are displayed) Labs Reviewed  URINALYSIS, ROUTINE W REFLEX MICROSCOPIC - Abnormal; Notable for the following components:      Result Value   Color, Urine YELLOW (*)    APPearance CLEAR (*)    Leukocytes,Ua TRACE (*)    All other components within normal limits  COMPREHENSIVE METABOLIC PANEL WITH GFR - Abnormal; Notable for the following components:   Glucose, Bld 122 (*)    Creatinine, Ser 1.35 (*)    GFR, Estimated 53 (*)    All other components within normal limits  CBC WITH DIFFERENTIAL/PLATELET     EKG None  RADIOLOGY CT abd and pelvis with iv contrast: No acute abnormality on my independent review interpretation and radiologist read this as moderate to large stool burden    PROCEDURES:  Critical Care performed: No  Procedures   MEDICATIONS ORDERED IN ED: Medications  sodium chloride  0.9 % bolus 1,000 mL (0 mLs Intravenous Stopped 06/25/24 2253)  iohexol  (OMNIPAQUE ) 300 MG/ML solution 100 mL (100 mLs Intravenous Contrast Given 06/25/24 2138)     IMPRESSION / MDM / ASSESSMENT AND PLAN / ED COURSE                                 Differential diagnosis includes, but is not limited to, nephrolithiasis, dysuria, reoccurrence of prostate malignancy, colitis diverticulitis UTI  ED course: Patient is well-appearing and abdomen demonstrates no evidence of peritonitis.  He had no leukocytosis anemia or profound electrolyte derangements.  His creatinine was at baseline.  Urinalysis was not consistent with UTI.  CT abdomen pelvis with IV contrast demonstrated no acute abnormality other than constipation.  Repeat abdominal exam is benign.  Patient received IV fluids for hydration.  I did order [employer] for MiraLAX  and a Fleet enema to his pharmacy of choice.  He will follow-up with his primary care physician.  All questions answered and patient requested discharge  Clinical Course as of 06/25/24 2319  Tue Jun 25, 2024  2122 WBC: 4.6 No leukocytosis [HD]  2122 Urinalysis, Routine w reflex microscopic -Urine, Clean Catch(!) No evidence of UTI [HD]  2205 Creatinine(!): 1.35 Creatinine at patient's baseline [HD]  2221 CT ABDOMEN PELVIS W CONTRAST No acute abnormality except for large stool burden.  Patient tells me he had a bowel movement this morning but also states that he was supposed to be on MiraLAX  and he has discontinued this.  I will send the patient home with prescription for MiraLAX  and an enema to use in the comfort of his home.  Repeat abdominal exam benign.  Patient has an appointment with urology already scheduled for next week.  All questions answered and patient feels comfortable returning home [HD]    Clinical Course User Index [HD] Nicholaus Rolland BRAVO, MD   At time of discharge there is no evidence of acute life, limb, vision, or fertility threat. Patient has stable vital signs, pain is well controlled, patient is ambulatory and p.o. tolerant.  Discharge instructions were completed using the [employer] system. I would refer you to those at this time. All warnings prescriptions follow-up etc. were discussed in detail with  the patient. Patient indicates understanding and is agreeable with this plan. All questions answered.  Patient is made aware that they may return to the emergency department for any worsening or new condition or for any other emergency.  Risk: 5 This patient has a high risk of morbidity due to further diagnostic testing or treatment. Rationale: This patient's evaluation and management involve a high risk of morbidity due to the potential severity of presenting symptoms, need for diagnostic testing, and/or initiation of treatment that may require close monitoring. The differential includes conditions with potential for significant deterioration or requiring escalation of care. Treatment decisions in the  ED, including medication administration, procedural interventions, or disposition planning, reflect this level of risk. Additional Support: -- Drug therapy requiring intensive monitoring for toxicity [ ]  -- Decision regarding elective major surgery with idenitified patient or procedure risk factors [ ]  -- Decision regarding hospitalization or escalation of hospital-level care [ ]  -- Decision not to resuscitate or to de-escalate care because of poor prognosis [ ]  -- Parental controlled substances [ ]   COPA: 5 The patient has a severe exacerbation, progression, or side effect of treatment of the following illness/illnesses: []  OR  The patient has the following acute or chronic illness/injury that poses a possible threat to life or bodily function: [X] : The patient has a potentially serious acute condition or an acute exacerbation of a chronic illness requiring urgent evaluation and management in the Emergency Department. The clinical presentation necessitates immediate consideration of life-threatening or function-threatening diagnoses, even if they are ultimately ruled out.  Data(2/3 categories following were performed): 5 I reviewed or ordered at least three unique tests, external notes, and/or the  history required an independent historian as one of the three requirements as following: CBC, CMP, urinalysis AND  I independently interpreted the following test: CT abdomen pelvis with IV contrast OR  I discussed the management of the patient with the following external physician or qualified healthcare provider: []     Suggested E/M Coding Level: 5, 99285, This has been selected based on the 07/13/22 CPT guidelines for E/M codes in the Emergency Department based on 2/3 of the CoPA, Data, and Risk.    FINAL CLINICAL IMPRESSION(S) / ED DIAGNOSES   Final diagnoses:  Lower abdominal pain  Constipation, unspecified constipation type     Rx / DC Orders   ED Discharge Orders          Ordered    polyethylene glycol (MIRALAX ) 17 g packet  Daily        06/25/24 07/14/2223    sodium phosphate  (FLEET) ENEM  Daily PRN        06/25/24 July 14, 2223             Note:  This document was prepared using Dragon voice recognition software and may include unintentional dictation errors.   Nicholaus Rolland BRAVO, MD 06/25/24 [phone]

## 2024-06-25 NOTE — ED Notes (Signed)
 Pt CC of lower abd/pelvic pain that has been ongoing x1 week. Denies urinaty/bowel changes. Ambulatory with steady gait.

## 2024-06-25 NOTE — ED Notes (Signed)
 No answer when called several times from lobby

## 2024-06-25 NOTE — ED Triage Notes (Signed)
 Pt c/o lower abdominal pain radiating into testicles x1 week.  Pain score 4/10.  Hx of prostate CA and sts he has had half of his prostate removed.  No current treatment.  Denies dysuria.

## 2024-06-25 NOTE — Discharge Instructions (Signed)
 You were seen in the emergency department for suprapubic abdominal pain that radiated to your groin.  Blood work exam and CT imaging was unremarkable except for some constipation.  This may be contributing to symptoms.  In the comfort of your own home I would give yourself an enema.  Ensure that you stay hydrated.  If you do not have a large bowel movement after the enema you can repeat this 1 more time the next day.  Please follow-up with your urologist as already scheduled.  Return with any acutely worsening symptoms or any other emergency.  It was very nice meeting you and I wish you the best of luck, -- RETURN PRECAUTIONS & AFTERCARE: (ENGLISH) RETURN PRECAUTIONS: Return immediately to the emergency department or see/call your doctor if you feel worse, weak or have changes in speech or vision, are short of breath, have fever, vomiting, pain, bleeding or dark stool, trouble urinating or any new issues. Return here or see/call your doctor if not improving as expected for your suspected condition. FOLLOW-UP CARE: Call your doctor and/or any doctors we referred you to for more advice and to make an appointment. Do this today, tomorrow or after the weekend. Some doctors only take PPO insurance so if you have HMO insurance you may want to contact your HMO or your regular doctor for referral to a specialist within your plan. Either way tell the doctor's office that it was a referral from the emergency department so you get the soonest possible appointment.  YOUR TEST RESULTS: Take result reports of any blood or urine tests, imaging tests and EKG's to your doctor and any referral doctor. Have any abnormal tests repeated. Your doctor or a referral doctor can let you know when this should be done. Also make sure your doctor contacts this hospital to get any test results that are not currently available such as cultures or special tests for infection and final imaging reports, which are often not available at the time  you leave the ER but which may list additional important findings that are not documented on the preliminary report. BLOOD PRESSURE: If your blood pressure was greater than 120/80 have your blood pressure rechecked within 1 to 2 weeks. MEDICATION SIDE EFFECTS: Do not drive, walk, bike, take the bus, etc. if you have received or are being prescribed any sedating medications such as those for pain or anxiety or certain antihistamines like Benadryl . If you have been give one of these here get a taxi home or have a friend drive you home. Ask your pharmacist to counsel you on potential side effects of any new medication

## 2024-06-26 ENCOUNTER — Encounter: Admitting: *Deleted

## 2024-06-26 DIAGNOSIS — J449 Chronic obstructive pulmonary disease, unspecified: Secondary | ICD-10-CM | POA: Insufficient documentation

## 2024-06-26 NOTE — Progress Notes (Signed)
 Daily Session Note  Patient Details  Name: Wesley Weber MRN: [id number] Date of Birth: 1946/06/08 Referring Provider:   [name] from 04/08/2024 in [facility] Cardiac and Pulmonary Rehab  Referring Provider Dr. Jereld Boos, MD    Encounter Date: 06/26/2024  Check In:  Session Check In - 06/26/24 1415       Check-In   Supervising physician immediately available to respond to emergencies See telemetry face sheet for immediately available ER MD    Location [facility]-Cardiac & Pulmonary Rehab    Staff Present Burnard Davenport RN,BSN,MPA;Teniyah Seivert Tressa RN,BSN;Kelly Dyane BS, ACSM CEP, Exercise Physiologist;Noah Tickle, BS, Exercise Physiologist    Virtual Visit No    Medication changes reported     No    Fall or balance concerns reported    No    Warm-up and Cool-down Performed on first and last piece of equipment    Resistance Training Performed Yes    VAD Patient? No    PAD/SET Patient? No      Pain Assessment   Currently in Pain? No/denies             Social History   Tobacco Use  Smoking Status Former   Current packs/day: 0.00   Types: Cigarettes   Start date: 09/06/1976   Quit date: 09/06/1986   Years since quitting: 37.8  Smokeless Tobacco Never    Goals Met:  Independence with exercise equipment Exercise tolerated well No report of concerns or symptoms today Strength training completed today  Goals Unmet:  Not Applicable  Comments: Pt able to follow exercise prescription today without complaint.  Will continue to monitor for progression.    Dr. Oneil Pinal is Medical Director for [facility] Cardiac Rehabilitation.  Dr. Fuad Aleskerov is Medical Director for [facility] Pulmonary Rehabilitation.

## 2024-06-27 ENCOUNTER — Encounter: Admitting: *Deleted

## 2024-06-27 DIAGNOSIS — J449 Chronic obstructive pulmonary disease, unspecified: Secondary | ICD-10-CM | POA: Diagnosis not present

## 2024-06-27 NOTE — Progress Notes (Signed)
 Daily Session Note  Patient Details  Name: Wesley Weber MRN: [id number] Date of Birth: Sep 18, 1946 Referring Provider:   [name] from 04/08/2024 in [facility] Cardiac and Pulmonary Rehab  Referring Provider Dr. Jereld Boos, MD    Encounter Date: 06/27/2024  Check In:  Session Check In - 06/27/24 1412       Check-In   Supervising physician immediately available to respond to emergencies See telemetry face sheet for immediately available ER MD    Location [facility]-Cardiac & Pulmonary Rehab    Staff Present Hoy Rodney RN,BSN;Maxon Conetta BS, Exercise Physiologist;Noah Tickle, BS, Exercise Physiologist;Joseph Rolinda RCP,RRT,BSRT    Virtual Visit No    Medication changes reported     No    Fall or balance concerns reported    No    Warm-up and Cool-down Performed on first and last piece of equipment    Resistance Training Performed Yes    VAD Patient? No    PAD/SET Patient? No      Pain Assessment   Currently in Pain? No/denies             Social History   Tobacco Use  Smoking Status Former   Current packs/day: 0.00   Types: Cigarettes   Start date: 09/06/1976   Quit date: 09/06/1986   Years since quitting: 37.8  Smokeless Tobacco Never    Goals Met:  Independence with exercise equipment Exercise tolerated well No report of concerns or symptoms today Strength training completed today  Goals Unmet:  Not Applicable  Comments: Pt able to follow exercise prescription today without complaint.  Will continue to monitor for progression.    Dr. Oneil Pinal is Medical Director for [facility] Cardiac Rehabilitation.  Dr. Fuad Aleskerov is Medical Director for [facility] Pulmonary Rehabilitation.

## 2024-07-03 ENCOUNTER — Encounter

## 2024-07-03 DIAGNOSIS — J449 Chronic obstructive pulmonary disease, unspecified: Secondary | ICD-10-CM | POA: Diagnosis not present

## 2024-07-03 NOTE — Progress Notes (Signed)
 Daily Session Note  Patient Details  Name: Wesley Weber MRN: [id number] Date of Birth: [birth year] Referring Provider:   [name] from 04/08/2024 in [facility] Cardiac and Pulmonary Rehab  Referring Provider Dr. Jereld Boos, MD    Encounter Date: 07/03/2024  Check In:  Session Check In - 07/03/24 1343       Check-In   Supervising physician immediately available to respond to emergencies See telemetry face sheet for immediately available ER MD    Location [facility]-Cardiac & Pulmonary Rehab    Staff Present Burnard Davenport RN,BSN,MPA;Maxon Conetta BS, Exercise Physiologist;Chaise Passarella Dyane HECKLE, ACSM CEP, Exercise Physiologist;Noah Tickle, BS, Exercise Physiologist    Virtual Visit No    Medication changes reported     No    Fall or balance concerns reported    No    Warm-up and Cool-down Performed on first and last piece of equipment    Resistance Training Performed Yes    VAD Patient? No    PAD/SET Patient? No      Pain Assessment   Currently in Pain? No/denies             Social History   Tobacco Use  Smoking Status Former   Current packs/day: 0.00   Types: Cigarettes   Start date: 09/06/1976   Quit date: 09/06/1986   Years since quitting: 37.8  Smokeless Tobacco Never    Goals Met:  Proper associated with RPD/PD & O2 Sat Independence with exercise equipment Using PLB without cueing & demonstrates good technique Exercise tolerated well No report of concerns or symptoms today Strength training completed today  Goals Unmet:  Not Applicable  Comments: Pt able to follow exercise prescription today without complaint.  Will continue to monitor for progression.    Dr. Oneil Pinal is Medical Director for [facility] Cardiac Rehabilitation.  Dr. Fuad Aleskerov is Medical Director for [facility] Pulmonary Rehabilitation.

## 2024-07-04 ENCOUNTER — Encounter: Admitting: Emergency Medicine

## 2024-07-04 DIAGNOSIS — J449 Chronic obstructive pulmonary disease, unspecified: Secondary | ICD-10-CM | POA: Diagnosis not present

## 2024-07-04 NOTE — Progress Notes (Signed)
 Daily Session Note  Patient Details  Name: LOKI WUTHRICH MRN: [id number] Date of Birth: 06-07-1946 Referring Provider:   [name] from 04/08/2024 in [facility] Cardiac and Pulmonary Rehab  Referring Provider Dr. Jereld Boos, MD    Encounter Date: 07/04/2024  Check In:  Session Check In - 07/04/24 1339       Check-In   Supervising physician immediately available to respond to emergencies See telemetry face sheet for immediately available ER MD    Location [facility]-Cardiac & Pulmonary Rehab    Staff Present Maxon Conetta BS, Exercise Physiologist;Noah Tickle, BS, Exercise Physiologist;Joseph Rolinda RCP,RRT,BSRT    Virtual Visit No    Medication changes reported     No    Fall or balance concerns reported    No    Warm-up and Cool-down Performed on first and last piece of equipment    Resistance Training Performed Yes    VAD Patient? No    PAD/SET Patient? No      Pain Assessment   Currently in Pain? No/denies             Social History   Tobacco Use  Smoking Status Former   Current packs/day: 0.00   Types: Cigarettes   Start date: 09/06/1976   Quit date: 09/06/1986   Years since quitting: 37.8  Smokeless Tobacco Never    Goals Met:  Proper associated with RPD/PD & O2 Sat Independence with exercise equipment Using PLB without cueing & demonstrates good technique Exercise tolerated well No report of concerns or symptoms today Strength training completed today  Goals Unmet:  Not Applicable  Comments: Pt able to follow exercise prescription today without complaint.  Will continue to monitor for progression.    Dr. Oneil Pinal is Medical Director for [facility] Cardiac Rehabilitation.  Dr. Fuad Aleskerov is Medical Director for [facility] Pulmonary Rehabilitation.

## 2024-07-10 ENCOUNTER — Encounter

## 2024-07-10 DIAGNOSIS — J449 Chronic obstructive pulmonary disease, unspecified: Secondary | ICD-10-CM | POA: Diagnosis not present

## 2024-07-10 NOTE — Progress Notes (Signed)
 Daily Session Note  Patient Details  Name: Wesley Weber MRN: [id number] Date of Birth: 25-Nov-1945 Referring Provider:   [name] from 04/08/2024 in [facility] Cardiac and Pulmonary Rehab  Referring Provider Dr. Jereld Boos, MD    Encounter Date: 07/10/2024  Check In:  Session Check In - 07/10/24 1359       Check-In   Supervising physician immediately available to respond to emergencies See telemetry face sheet for immediately available ER MD    Location [facility]-Cardiac & Pulmonary Rehab    Staff Present Burnard Davenport RN,BSN,MPA;Meredith Tressa RN,BSN;Laura Cates RN,BSN;Jabree Pernice Dyane BS, ACSM CEP, Exercise Physiologist;Jason Elnor RDN,LDN    Virtual Visit No    Medication changes reported     No    Fall or balance concerns reported    No    Warm-up and Cool-down Performed on first and last piece of equipment    Resistance Training Performed Yes    VAD Patient? No    PAD/SET Patient? No      Pain Assessment   Currently in Pain? No/denies             Social History   Tobacco Use  Smoking Status Former   Current packs/day: 0.00   Types: Cigarettes   Start date: 09/06/1976   Quit date: 09/06/1986   Years since quitting: 37.8  Smokeless Tobacco Never    Goals Met:  Proper associated with RPD/PD & O2 Sat Independence with exercise equipment Using PLB without cueing & demonstrates good technique Exercise tolerated well No report of concerns or symptoms today Strength training completed today  Goals Unmet:  Not Applicable  Comments: Pt able to follow exercise prescription today without complaint.  Will continue to monitor for progression.    Dr. Oneil Pinal is Medical Director for [facility] Cardiac Rehabilitation.  Dr. Fuad Aleskerov is Medical Director for [facility] Pulmonary Rehabilitation.

## 2024-07-11 ENCOUNTER — Encounter: Admitting: *Deleted

## 2024-07-11 DIAGNOSIS — J449 Chronic obstructive pulmonary disease, unspecified: Secondary | ICD-10-CM

## 2024-07-11 NOTE — Progress Notes (Signed)
 Daily Session Note  Patient Details  Name: Wesley Weber MRN: [id number] Date of Birth: March 06, 1946 Referring Provider:   [name] from 04/08/2024 in [facility] Cardiac and Pulmonary Rehab  Referring Provider Dr. Jereld Boos, MD    Encounter Date: 07/11/2024  Check In:  Session Check In - 07/11/24 1334       Check-In   Supervising physician immediately available to respond to emergencies See telemetry face sheet for immediately available ER MD    Location [facility]-Cardiac & Pulmonary Rehab    Staff Present Hoy Rodney RN,BSN;Laura Cates RN,BSN;Joseph Rolinda NORWOOD HARMAN Verlie Laird, [place], Exercise Physiologist    Virtual Visit No    Medication changes reported     No    Fall or balance concerns reported    No    Warm-up and Cool-down Performed on first and last piece of equipment    Resistance Training Performed Yes    VAD Patient? No    PAD/SET Patient? No      Pain Assessment   Currently in Pain? No/denies             Social History   Tobacco Use  Smoking Status Former   Current packs/day: 0.00   Types: Cigarettes   Start date: 09/06/1976   Quit date: 09/06/1986   Years since quitting: 37.8  Smokeless Tobacco Never    Goals Met:  Independence with exercise equipment Exercise tolerated well No report of concerns or symptoms today Strength training completed today  Goals Unmet:  Not Applicable  Comments: Pt able to follow exercise prescription today without complaint.  Will continue to monitor for progression.    Dr. Oneil Pinal is Medical Director for [facility] Cardiac Rehabilitation.  Dr. Fuad Aleskerov is Medical Director for [facility] Pulmonary Rehabilitation.

## 2024-07-17 ENCOUNTER — Encounter: Admitting: Emergency Medicine

## 2024-07-17 DIAGNOSIS — J449 Chronic obstructive pulmonary disease, unspecified: Secondary | ICD-10-CM | POA: Diagnosis not present

## 2024-07-17 NOTE — Progress Notes (Signed)
 Daily Session Note  Patient Details  Name: Wesley Weber MRN: [id number] Date of Birth: [birth year] Referring Provider:   [name] from 04/08/2024 in [facility] Cardiac and Pulmonary Rehab  Referring Provider Dr. Jereld Boos, MD    Encounter Date: 07/17/2024  Check In:  Session Check In - 07/17/24 1359       Check-In   Supervising physician immediately available to respond to emergencies See telemetry face sheet for immediately available ER MD    Location [facility]-Cardiac & Pulmonary Rehab    Staff Present Burnard Hint BS, ACSM CEP, Exercise Physiologist;Noah Tickle, BS, Exercise Physiologist;Teasha Murrillo RN,BSN;Kelly Bollinger [facility]    Virtual Visit No    Medication changes reported     No    Fall or balance concerns reported    No    Warm-up and Cool-down Performed on first and last piece of equipment    Resistance Training Performed Yes    VAD Patient? No    PAD/SET Patient? No      Pain Assessment   Currently in Pain? No/denies             Social History   Tobacco Use  Smoking Status Former   Current packs/day: 0.00   Types: Cigarettes   Start date: 09/06/1976   Quit date: 09/06/1986   Years since quitting: 37.8  Smokeless Tobacco Never    Goals Met:  Proper associated with RPD/PD & O2 Sat Independence with exercise equipment Using PLB without cueing & demonstrates good technique Exercise tolerated well No report of concerns or symptoms today Strength training completed today  Goals Unmet:  Not Applicable  Comments: Pt able to follow exercise prescription today without complaint.  Will continue to monitor for progression.    Dr. Oneil Pinal is Medical Director for [facility] Cardiac Rehabilitation.  Dr. Fuad Aleskerov is Medical Director for [facility] Pulmonary Rehabilitation.

## 2024-07-17 NOTE — Progress Notes (Signed)
 Pulmonary Individual Treatment Plan  Patient Details  Name: Wesley Weber MRN: [id number] Date of Birth: 03/29/1946 Referring Provider:   [name] Pulmonary Rehab from 04/08/2024 in [facility] Cardiac and Pulmonary Rehab  Referring Provider Dr. Jereld Boos, MD    Initial Encounter Date:  Flowsheet Row Pulmonary Rehab from 04/08/2024 in [facility] Cardiac and Pulmonary Rehab  Date 04/08/24    Visit Diagnosis: Chronic obstructive pulmonary disease, unspecified COPD type (HCC)  Patient's Home Medications on Admission:  Current Outpatient Medications:    albuterol  (PROVENTIL  HFA;VENTOLIN  HFA) 108 (90 Base) MCG/ACT inhaler, Inhale 2 puffs into the lungs every 4 (four) hours as needed for wheezing or shortness of breath., Disp: , Rfl:    amLODipine  (NORVASC ) 5 MG tablet, Take 5 mg by mouth daily., Disp: , Rfl:    aspirin  EC 81 MG tablet, Take 81 mg by mouth daily. (Patient not taking: Reported on 04/03/2024), Disp: , Rfl:    azelastine (ASTELIN) 0.1 % nasal spray, Place 1 spray into both nostrils 2 (two) times daily. Use in each nostril as directed (Patient not taking: Reported on 04/03/2024), Disp: , Rfl:    budesonide (RHINOCORT AQUA) 32 MCG/ACT nasal spray, Place 1 spray into both nostrils 2 (two) times daily., Disp: , Rfl:    chlorpheniramine (CHLOR-TRIMETON) 4 MG tablet, Take 4 mg by mouth daily as needed for allergies. (Patient not taking: Reported on 04/03/2024), Disp: , Rfl:    Cholecalciferol  (VITAMIN D ) 2000 units CAPS, Take 2,000 Units by mouth daily. , Disp: , Rfl:    diclofenac Sodium (VOLTAREN) 1 % GEL, Apply 4 g topically 3 (three) times daily., Disp: , Rfl:    famotidine (PEPCID) 40 MG tablet, Take 40 mg by mouth 2 (two) times daily., Disp: , Rfl:    ferrous sulfate  325 (65 FE) MG tablet, Take 325 mg by mouth daily with breakfast., Disp: , Rfl:    fluticasone  (FLONASE ) 50 MCG/ACT nasal spray, Place 1 spray into both nostrils 2 (two) times daily. (Patient not taking: Reported on  04/03/2024), Disp: , Rfl:    ipratropium (ATROVENT) 0.06 % nasal spray, Place 2 sprays into both nostrils 2 (two) times daily., Disp: , Rfl:    ketotifen (ZADITOR) 0.035 % ophthalmic solution, Place 1 drop into both eyes 2 (two) times daily., Disp: , Rfl:    LIVALO 1 MG TABS, Take 1 tablet by mouth daily. (Patient not taking: Reported on 04/03/2024), Disp: , Rfl:    loratadine (CLARITIN) 10 MG tablet, Take 1 tablet by mouth daily as needed. (Patient not taking: Reported on 04/03/2024), Disp: , Rfl:    mometasone (ASMANEX) 220 MCG/INH inhaler, Inhale 2 puffs into the lungs at bedtime. , Disp: , Rfl:    omeprazole (PRILOSEC) 40 MG capsule, Take 40 mg by mouth daily. (Patient not taking: Reported on 04/03/2024), Disp: , Rfl:    polyethylene glycol (MIRALAX ) 17 g packet, Take 17 g by mouth daily., Disp: 30 packet, Rfl: 2   sildenafil (VIAGRA) 100 MG tablet, Take 100 mg by mouth daily as needed for erectile dysfunction., Disp: , Rfl:    valsartan (DIOVAN) 160 MG tablet, Take 160 mg by mouth daily. , Disp: , Rfl:   Past Medical History: Past Medical History:  Diagnosis Date   Asthma    followed by [facility]   Benign localized prostatic hyperplasia with lower urinary tract symptoms (LUTS)    urologist-- dr sherrilee--  s/p urolift twice in 2019   Chronic constipation    Chronic dryness of  both eyes    Elevated PSA    Foley catheter in place    GERD (gastroesophageal reflux disease)    Gross hematuria    History of prostatitis    Hyperlipidemia    Hypertension    followed by pcp  (11-01-2019 per pt had nuclear stress test done @[facility]  approx. 2018, told is was normal)   IDA (iron deficiency anemia)    OSA on CPAP    uses nightly   Wears glasses    Wears hearing aid in both ears     Tobacco Use: Social History   Tobacco Use  Smoking Status Former   Current packs/day: 0.00   Types: Cigarettes   Start date: 09/06/1976   Quit date: 09/06/1986   Years since quitting: 37.8  Smokeless  Tobacco Never    Labs: Review Flowsheet       Latest Ref Rng & Units 11/04/2019  Labs for ITP Cardiac and Pulmonary Rehab  TCO2 22 - 32 mmol/L 24      Pulmonary Assessment Scores:  Pulmonary Assessment Scores     Row Name 04/08/24 1021         ADL UCSD   ADL Phase Entry     SOB Score total 64     Rest 2     Walk 3     Stairs 4     Bath 2     Dress 2     Shop 2       CAT Score   CAT Score 22       mMRC Score   mMRC Score 1        UCSD: Self-administered rating of dyspnea associated with activities of daily living (ADLs) 6-point scale (0 = not at all to 5 = maximal or unable to do because of breathlessness)  Scoring Scores range from 0 to 120.  Minimally important difference is 5 units  CAT: CAT can identify the health impairment of COPD patients and is better correlated with disease progression.  CAT has a scoring range of zero to 40. The CAT score is classified into four groups of low (less than 10), medium (10 - 20), high (21-30) and very high (31-40) based on the impact level of disease on health status. A CAT score over 10 suggests significant symptoms.  A worsening CAT score could be explained by an exacerbation, poor medication adherence, poor inhaler technique, or progression of COPD or comorbid conditions.  CAT MCID is 2 points  mMRC: mMRC (Modified Medical Research Council) Dyspnea Scale is used to assess the degree of baseline functional disability in patients of respiratory disease due to dyspnea. No minimal important difference is established. A decrease in score of 1 point or greater is considered a positive change.   Pulmonary Function Assessment:   Exercise Target Goals: Exercise Program Goal: Individual exercise prescription set using results from initial 6 min walk test and THRR while considering  patient's activity barriers and safety.   Exercise Prescription Goal: Initial exercise prescription builds to 30-45 minutes a day of aerobic  activity, 2-3 days per week.  Home exercise guidelines will be given to patient during program as part of exercise prescription that the participant will acknowledge.  Education: Aerobic Exercise: - Group verbal and visual presentation on the components of exercise prescription. Introduces F.I.T.T principle from ACSM for exercise prescriptions.  Reviews F.I.T.T. principles of aerobic exercise including progression. Written material provided at class time. Flowsheet Row Pulmonary Rehab from 07/10/2024 in [facility] Cardiac  and Pulmonary Rehab  Date 04/17/24  Educator [facility]  Instruction Review Code 1- Verbalizes Understanding    Education: Resistance Exercise: - Group verbal and visual presentation on the components of exercise prescription. Introduces F.I.T.T principle from ACSM for exercise prescriptions  Reviews F.I.T.T. principles of resistance exercise including progression. Written material provided at class time. Flowsheet Row Pulmonary Rehab from 07/10/2024 in [facility] Cardiac and Pulmonary Rehab  Date 04/10/24  Educator [facility]  Instruction Review Code 1- [employer] Understanding     Education: Exercise & Equipment Safety: - Individual verbal instruction and demonstration of equipment use and safety with use of the equipment. Flowsheet Row Pulmonary Rehab from 07/10/2024 in [facility] Cardiac and Pulmonary Rehab  Date 04/08/24  Educator [facility]  Instruction Review Code 1- Verbalizes Understanding    Education: Exercise Physiology & General Exercise Guidelines: - Group verbal and written instruction with models to review the exercise physiology of the cardiovascular system and associated critical values. Provides general exercise guidelines with specific guidelines to those with heart or lung disease.  Flowsheet Row Pulmonary Rehab from 07/10/2024 in [facility] Cardiac and Pulmonary Rehab  Date 06/12/24  Educator [facility]  Instruction Review Code 1- [employer] Understanding    Education: Flexibility, Balance, Mind/Body  Relaxation: - Group verbal and visual presentation with interactive activity on the components of exercise prescription. Introduces F.I.T.T principle from ACSM for exercise prescriptions. Reviews F.I.T.T. principles of flexibility and balance exercise training including progression. Also discusses the mind body connection.  Reviews various relaxation techniques to help reduce and manage stress (i.e. Deep breathing, progressive muscle relaxation, and visualization). Balance handout provided to take home. Written material provided at class time. Flowsheet Row Pulmonary Rehab from 07/10/2024 in [facility] Cardiac and Pulmonary Rehab  Date 04/10/24  Educator [facility]  Instruction Review Code 1- Verbalizes Understanding    Activity Barriers & Risk Stratification:  Activity Barriers & Cardiac Risk Stratification - 04/03/24 1015       Activity Barriers & Cardiac Risk Stratification   Activity Barriers Joint Problems;Back Problems;Balance Concerns;Muscular Weakness   right ankle replacement         6 Minute Walk:  6 Minute Walk     Row Name 04/08/24 1032         6 Minute Walk   Phase Initial     Distance 1165 feet     Walk Time 6 minutes     # of Rest Breaks 0     MPH 2.21     METS 2.11     RPE 12     Perceived Dyspnea  2     VO2 Peak 7.38     Symptoms No     Resting HR 55 bpm     Resting BP 112/58     Resting Oxygen Saturation  99 %     Exercise Oxygen Saturation  during 6 min walk 97 %     Max Ex. HR 88 bpm     Max Ex. BP 148/62     2 Minute Post BP 122/58       Interval HR   1 Minute HR 78     2 Minute HR 79     3 Minute HR 82     4 Minute HR 84     5 Minute HR 88     6 Minute HR 87     2 Minute Post HR 55     Interval Heart Rate? Yes       Interval Oxygen   Interval  Oxygen? Yes     Baseline Oxygen Saturation % 99 %     1 Minute Oxygen Saturation % 98 %     1 Minute Liters of Oxygen 0 L  RA     2 Minute Oxygen Saturation % 98 %     2 Minute Liters of Oxygen 0 L     3  Minute Oxygen Saturation % 98 %     3 Minute Liters of Oxygen 0 L     4 Minute Oxygen Saturation % 97 %     4 Minute Liters of Oxygen 0 L     5 Minute Oxygen Saturation % 98 %     5 Minute Liters of Oxygen 0 L     6 Minute Oxygen Saturation % 99 %     6 Minute Liters of Oxygen 0 L     2 Minute Post Oxygen Saturation % 99 %     2 Minute Post Liters of Oxygen 0 L       Oxygen Initial Assessment:  Oxygen Initial Assessment - 04/03/24 1017       Home Oxygen   Home Oxygen Device None    Sleep Oxygen Prescription CPAP    Home Exercise Oxygen Prescription None    Home Resting Oxygen Prescription None    Compliance with Home Oxygen Use Yes      Intervention   Short Term Goals To learn and understand importance of monitoring SPO2 with pulse oximeter and demonstrate accurate use of the pulse oximeter.;To learn and understand importance of maintaining oxygen saturations>88%;To learn and demonstrate proper pursed lip breathing techniques or other breathing techniques. ;To learn and demonstrate proper use of respiratory medications    Long  Term Goals Verbalizes importance of monitoring SPO2 with pulse oximeter and return demonstration;Maintenance of O2 saturations>88%;Exhibits proper breathing techniques, such as pursed lip breathing or other method taught during program session;Compliance with respiratory medication;Demonstrates proper use of MDI's          Oxygen Re-Evaluation:  Oxygen Re-Evaluation     Row Name 05/16/24 1421 05/30/24 1409 07/03/24 1357         Program Oxygen Prescription   Program Oxygen Prescription None None None       Home Oxygen   Home Oxygen Device None None None     Sleep Oxygen Prescription CPAP CPAP CPAP     Liters per minute 0 0 0     Home Exercise Oxygen Prescription None None None     Home Resting Oxygen Prescription None None None     Compliance with Home Oxygen Use Yes Yes Yes       Goals/Expected Outcomes   Short Term Goals To learn and  demonstrate proper pursed lip breathing techniques or other breathing techniques.  To learn and demonstrate proper pursed lip breathing techniques or other breathing techniques.  To learn and demonstrate proper pursed lip breathing techniques or other breathing techniques.      Long  Term Goals Exhibits proper breathing techniques, such as pursed lip breathing or other method taught during program session Exhibits proper breathing techniques, such as pursed lip breathing or other method taught during program session Exhibits proper breathing techniques, such as pursed lip breathing or other method taught during program session     Comments Informed patient how to perform the Pursed Lipped breathing technique. Told patient to Inhale through the nose and out the mouth with pursed lips to keep their airways open, help oxygenate them better,  practice when at rest or doing strenuous activity. Patient Verbalizes understanding of technique and will work on and be reiterated during [facility]. Informed patient how to perform the Pursed Lipped breathing technique. Told patient to Inhale through the nose and out the mouth with pursed lips to keep their airways open, help oxygenate them better, practice when at rest or doing strenuous activity. Patient Verbalizes understanding of technique and will work on and be reiterated during [facility]. Informed patient how to perform the Pursed Lipped breathing technique. Told patient to Inhale through the nose and out the mouth with pursed lips to keep their airways open, help oxygenate them better, practice when at rest or doing strenuous activity. Patient Verbalizes understanding of technique and will work on and be reiterated during [facility].     Goals/Expected Outcomes Short: use PLB with exertion. Long: use PLB on exertion proficiently and independently. Short: use PLB with exertion. Long: use PLB on exertion proficiently and independently. Short: use PLB with exertion. Long:  use PLB on exertion proficiently and independently.        Oxygen Discharge (Final Oxygen Re-Evaluation):  Oxygen Re-Evaluation - 07/03/24 1357       Program Oxygen Prescription   Program Oxygen Prescription None      Home Oxygen   Home Oxygen Device None    Sleep Oxygen Prescription CPAP    Liters per minute 0    Home Exercise Oxygen Prescription None    Home Resting Oxygen Prescription None    Compliance with Home Oxygen Use Yes      Goals/Expected Outcomes   Short Term Goals To learn and demonstrate proper pursed lip breathing techniques or other breathing techniques.     Long  Term Goals Exhibits proper breathing techniques, such as pursed lip breathing or other method taught during program session    Comments Informed patient how to perform the Pursed Lipped breathing technique. Told patient to Inhale through the nose and out the mouth with pursed lips to keep their airways open, help oxygenate them better, practice when at rest or doing strenuous activity. Patient Verbalizes understanding of technique and will work on and be reiterated during [facility].    Goals/Expected Outcomes Short: use PLB with exertion. Long: use PLB on exertion proficiently and independently.          Initial Exercise Prescription:  Initial Exercise Prescription - 04/08/24 1000       Date of Initial Exercise RX and Referring Provider   Date 04/08/24    Referring Provider Dr. Jereld Boos, MD      Oxygen   Maintain Oxygen Saturation 88% or higher      Treadmill   MPH 2.5    Grade 0    Minutes 15    METs 2.91      NuStep   Level 2   T6 nustep   SPM 80    Minutes 15    METs 2.11      REL-XR   Level 2    Speed 50    Minutes 15    METs 2.11      Prescription Details   Frequency (times per week) 2    Duration Progress to 30 minutes of continuous aerobic without signs/symptoms of physical distress      Intensity   THRR 40-80% of Max Heartrate 89-123    Ratings of Perceived  Exertion 11-13    Perceived Dyspnea 0-4      Progression   Progression Continue to progress workloads to  maintain intensity without signs/symptoms of physical distress.      Resistance Training   Training Prescription Yes    Weight 8 lb    Reps 10-15          Perform Capillary Blood Glucose checks as needed.  Exercise Prescription Changes:   Exercise Prescription Changes     Row Name 04/08/24 1000 04/24/24 1100 05/08/24 1400 05/22/24 0800 06/05/24 0900     Response to Exercise   Blood Pressure (Admit) 112/58 110/68 108/68 102/60 102/58   Blood Pressure (Exercise) 148/62 152/74 144/72 144/68 --   Blood Pressure (Exit) 122/58 102/58 110/64 118/54 102/60   Heart Rate (Admit) 55 bpm 63 bpm 56 bpm 61 bpm 52 bpm   Heart Rate (Exercise) 88 bpm 93 bpm 93 bpm 91 bpm 92 bpm   Heart Rate (Exit) 55 bpm 70 bpm 67 bpm 68 bpm 60 bpm   Oxygen Saturation (Admit) 99 % 98 % 98 % 97 % 98 %   Oxygen Saturation (Exercise) 97 % 97 % 96 % 97 % 96 %   Oxygen Saturation (Exit) 99 % 97 % 98 % 98 % 98 %   Rating of Perceived Exertion (Exercise) 12 13 15 13 12    Perceived Dyspnea (Exercise) 2 2 1 2 1    Symptoms none none none none none   Comments Results first 2 weeks of exercise first 2 weeks of exercise -- --   Duration -- Progress to 30 minutes of  aerobic without signs/symptoms of physical distress Progress to 30 minutes of  aerobic without signs/symptoms of physical distress Continue with 30 min of aerobic exercise without signs/symptoms of physical distress. Continue with 30 min of aerobic exercise without signs/symptoms of physical distress.   Intensity -- THRR unchanged THRR unchanged THRR unchanged THRR unchanged     Progression   Progression -- Continue to progress workloads to maintain intensity without signs/symptoms of physical distress. Continue to progress workloads to maintain intensity without signs/symptoms of physical distress. Continue to progress workloads to maintain intensity  without signs/symptoms of physical distress. Continue to progress workloads to maintain intensity without signs/symptoms of physical distress.   Average METs -- 3.13 3.39 3.32 3.49     Resistance Training   Training Prescription -- Yes Yes Yes Yes   Weight -- 8 lb 8 lb 8 lb 8 lb   Reps -- 10-15 10-15 10-15 10-15     Interval Training   Interval Training -- No No No No     Treadmill   MPH -- 2.8 2.6 2.6 2.8   Grade -- 0 0 0 0   Minutes -- 15 15 15 15    METs -- 3.14 2.99 2.99 3.14     NuStep   Level -- 3  T6 2  T6 5  T6 nustep 3  T6   Minutes -- 15 15 15 15    METs -- 2.1 2.5 2.6 3.1     REL-XR   Level -- 2 10 5 6    Minutes -- 15 15 15 15    METs -- 4.6 7.2 4.2 5.3     Oxygen   Maintain Oxygen Saturation -- 88% or higher 88% or higher 88% or higher 88% or higher    Row Name 06/05/24 1400 06/19/24 0700 07/04/24 0700         Response to Exercise   Blood Pressure (Admit) -- 104/66 108/64     Blood Pressure (Exit) -- 124/68 124/70     Heart Rate (Admit) --  62 bpm 62 bpm     Heart Rate (Exercise) -- 97 bpm 88 bpm     Heart Rate (Exit) -- 71 bpm 69 bpm     Oxygen Saturation (Admit) -- 98 % 98 %     Oxygen Saturation (Exercise) -- 96 % 96 %     Oxygen Saturation (Exit) -- 97 % 97 %     Rating of Perceived Exertion (Exercise) -- 14 15     Perceived Dyspnea (Exercise) -- 1 1     Symptoms -- none none     Duration Continue with 30 min of aerobic exercise without signs/symptoms of physical distress. Continue with 30 min of aerobic exercise without signs/symptoms of physical distress. Continue with 30 min of aerobic exercise without signs/symptoms of physical distress.     Intensity THRR unchanged THRR unchanged THRR unchanged       Progression   Progression Continue to progress workloads to maintain intensity without signs/symptoms of physical distress. Continue to progress workloads to maintain intensity without signs/symptoms of physical distress. Continue to progress workloads  to maintain intensity without signs/symptoms of physical distress.     Average METs 3.49 3.6 2.72       Resistance Training   Training Prescription Yes Yes Yes     Weight 8 lb 8 lb 8 lb     Reps 10-15 10-15 10-15       Interval Training   Interval Training No No No       Treadmill   MPH 2.8 2.8 2.8     Grade 0 0 0     Minutes 15 15 15      METs 3.14 3.14 3.14       NuStep   Level 3  T6 3  T6 3  T6     Minutes 15 15 15      METs 3.1 3.7 3.6       REL-XR   Level 6 4 4      Minutes 15 15 15      METs 5.3 3.1 2.8       Home Exercise Plan   Plans to continue exercise at [employer] (comment)  [employer], [place] exercise class, and walking [employer] (comment)  [employer] [place], and walking [employer] (comment)  [employer], [place] exercise class, and walking     Frequency Add 2 additional days to program exercise sessions. Add 2 additional days to program exercise sessions. Add 2 additional days to program exercise sessions.     Initial Home Exercises Provided 06/05/24 06/05/24 06/05/24       Oxygen   Maintain Oxygen Saturation 88% or higher 88% or higher 88% or higher        Exercise Comments:   Exercise Comments     Row Name 04/10/24 1351           Exercise Comments First full day of exercise!  Patient was oriented to gym and equipment including functions, settings, policies, and procedures.  Patient's individual exercise prescription and treatment plan were reviewed.  All starting workloads were established based on the results of the 6 minute walk test done at initial orientation visit.  The plan for exercise progression was also introduced and progression will be customized based on patient's performance and goals.          Exercise Goals and Review:   Exercise Goals     Row Name 04/08/24 1030             Exercise  Goals   Increase Physical Activity Yes       Intervention Provide advice, education, support and counseling about physical  activity/exercise needs.;Develop an individualized exercise prescription for aerobic and resistive training based on initial evaluation findings, risk stratification, comorbidities and participant's personal goals.       Expected Outcomes Short Term: Attend rehab on a regular basis to increase amount of physical activity.;Long Term: Add in home exercise to make exercise part of routine and to increase amount of physical activity.;Long Term: Exercising regularly at least 3-5 days a week.       Increase Strength and Stamina Yes       Intervention Develop an individualized exercise prescription for aerobic and resistive training based on initial evaluation findings, risk stratification, comorbidities and participant's personal goals.;Provide advice, education, support and counseling about physical activity/exercise needs.       Expected Outcomes Short Term: Increase workloads from initial exercise prescription for resistance, speed, and METs.;Short Term: Perform resistance training exercises routinely during rehab and add in resistance training at home;Long Term: Improve cardiorespiratory fitness, muscular endurance and strength as measured by increased METs and functional capacity ( )       Able to understand and use rate of perceived exertion (RPE) scale Yes       Intervention Provide education and explanation on how to use RPE scale       Expected Outcomes Short Term: Able to use RPE daily in rehab to express subjective intensity level;Long Term:  Able to use RPE to guide intensity level when exercising independently       Able to understand and use Dyspnea scale Yes       Intervention Provide education and explanation on how to use Dyspnea scale       Expected Outcomes Short Term: Able to use Dyspnea scale daily in rehab to express subjective sense of shortness of breath during exertion;Long Term: Able to use Dyspnea scale to guide intensity level when exercising independently       Knowledge and  understanding of Target Heart Rate Range (THRR) Yes       Intervention Provide education and explanation of THRR including how the numbers were predicted and where they are located for reference       Expected Outcomes Short Term: Able to state/look up THRR;Long Term: Able to use THRR to govern intensity when exercising independently;Short Term: Able to use daily as guideline for intensity in rehab       Able to check pulse independently Yes       Intervention Provide education and demonstration on how to check pulse in carotid and radial arteries.;Review the importance of being able to check your own pulse for safety during independent exercise       Expected Outcomes Long Term: Able to check pulse independently and accurately;Short Term: Able to explain why pulse checking is important during independent exercise       Understanding of Exercise Prescription Yes       Intervention Provide education, explanation, and written materials on patient's individual exercise prescription       Expected Outcomes Short Term: Able to explain program exercise prescription;Long Term: Able to explain home exercise prescription to exercise independently          Exercise Goals Re-Evaluation :  Exercise Goals Re-Evaluation     Row Name 04/10/24 1352 04/24/24 1118 05/08/24 1417 05/22/24 0850 06/05/24 0947     Exercise Goal Re-Evaluation   Exercise Goals Review Increase Physical Activity;Knowledge and  understanding of Target Heart Rate Range (THRR);Able to understand and use rate of perceived exertion (RPE) scale;Understanding of Exercise Prescription;Increase Strength and Stamina;Able to check pulse independently Increase Physical Activity;Increase Strength and Stamina;Understanding of Exercise Prescription Increase Physical Activity;Increase Strength and Stamina;Understanding of Exercise Prescription Increase Physical Activity;Increase Strength and Stamina;Understanding of Exercise Prescription Increase Physical  Activity;Increase Strength and Stamina;Understanding of Exercise Prescription   Comments Reviewed RPE and dyspnea scale, THR and program prescription with pt today.  Pt voiced understanding and was given a copy of goals to take home. Saketh is off to a good start in the program. He was able to attend his first 4 sessions during this review period. During his few sessions he was able to use the treadmill at a speed of 2. and no incline. He was also able to increase to level 3 on the T6 nustep. We will continue to monitor his progress in the program. Misael continues to do well in the program. He was recently able to increase from level 2 to 10 on the XR. He was also able to use the Treadmill at a workload of 2. and no incline. We will continue to monitor his progress in the program. Flynt is doing well in rehab. He continues to do well on the treadmill at a speed of 2.6 mph with no incline. He also improved to level 5 on the T6 nustep and continues to use 8 lb handweights for resistance training. We will continue to monitor his progress in the program. Kamuela continues to do well in rehab. He was recently able to increase his speed on the treadmill from 2.6 to 2.8 mph with no incline. He was also able to maintain level 3 on the T6 nustep. We will continue to monitor his progress in the program.   Expected Outcomes Short: Use RPE daily to regulate intensity.  Long: Follow program prescription in THR. Short: Continue to follow exercise prescription. Long: Continue exercise to improve strength and stamina. Short: Continue to follow exercise prescription. Long: Continue exercise to improve strength and stamina. Short: Continue to progressively increase treadmill workload. Long: Continue exercise to improve strength and stamina. Short: Continue to progressively increase treadmill workload. Long: Continue exercise to improve strength and stamina.    Row Name 06/05/24 1400 06/19/24 0754 07/03/24 1357  07/04/24 0801       Exercise Goal Re-Evaluation   Exercise Goals Review Increase Physical Activity;Able to understand and use rate of perceived exertion (RPE) scale;Knowledge and understanding of Target Heart Rate Range (THRR);Understanding of Exercise Prescription;Increase Strength and Stamina;Able to understand and use Dyspnea scale;Able to check pulse independently Increase Physical Activity;Increase Strength and Stamina;Understanding of Exercise Prescription Increase Physical Activity;Increase Strength and Stamina;Understanding of Exercise Prescription Increase Physical Activity;Increase Strength and Stamina;Understanding of Exercise Prescription    Comments Reviewed home exercise with pt today from 1:50 pm to 2:00 pm.  Pt plans to go to the [employer] [place], [place] exercise class, and walk for exercise.  Reviewed THR, pulse, RPE, sign and symptoms, pulse oximetery and when to call 911 or MD.  Also discussed weather considerations and indoor options.  Pt voiced understanding. Jahmier is doing well in rehab. He continues to use the treadmill at a speed of 2. and no incline, and the T6 nustep at level 3. We will continue to encourage and monitor his progress in the program. Faruq is doing well in the program, and has been doing home exercise at the [facility] on days not in the program. Carleton continues to do  well in rehab. He maintained level 4 on the XR and level 3 on the T6 nustep. He also maintained his workload on the treadmill at a speed of 2.8 mph with no incline. We will continue to monitor his progress in the program.    Expected Outcomes Short: add 1-2 days a week of exercise outside of pulmonary rehab. Long: maintain independent exercise routine upon graduation from rehab. Short: Add an incline of 1% to his treadmill workload. Long: Continue exercise to improve strength and stamina. Short: Continue to go to the [facility] on days not in the program. Long: Continue exercise to improve strength and stamina. Short: Try  level 5 on the XR and level 4 on the T6 nustep. Long: Continue exercise to improve strength and stamina.       Discharge Exercise Prescription (Final Exercise Prescription Changes):  Exercise Prescription Changes - 07/04/24 0700       Response to Exercise   Blood Pressure (Admit) 108/64    Blood Pressure (Exit) 124/70    Heart Rate (Admit) 62 bpm    Heart Rate (Exercise) 88 bpm    Heart Rate (Exit) 69 bpm    Oxygen Saturation (Admit) 98 %    Oxygen Saturation (Exercise) 96 %    Oxygen Saturation (Exit) 97 %    Rating of Perceived Exertion (Exercise) 15    Perceived Dyspnea (Exercise) 1    Symptoms none    Duration Continue with 30 min of aerobic exercise without signs/symptoms of physical distress.    Intensity THRR unchanged      Progression   Progression Continue to progress workloads to maintain intensity without signs/symptoms of physical distress.    Average METs 2.72      Resistance Training   Training Prescription Yes    Weight 8 lb    Reps 10-15      Interval Training   Interval Training No      Treadmill   MPH 2.8    Grade 0    Minutes 15    METs 3.14      NuStep   Level 3   T6   Minutes 15    METs 3.6      REL-XR   Level 4    Minutes 15    METs 2.8      Home Exercise Plan   Plans to continue exercise at [employer] (comment)   [employer], [place] exercise class, and walking   Frequency Add 2 additional days to program exercise sessions.    Initial Home Exercises Provided 06/05/24      Oxygen   Maintain Oxygen Saturation 88% or higher          Nutrition:  Target Goals: Understanding of nutrition guidelines, daily intake of sodium 1500mg , cholesterol 200mg , calories 30% from fat and 7% or less from saturated fats, daily to have 5 or more servings of fruits and vegetables.  Education: Nutrition 1 -Group instruction provided by verbal, written material, interactive activities, discussions, models, and posters to present general guidelines for  heart healthy nutrition including macronutrients, label reading, and promoting whole foods over processed counterparts. Education serves as Pensions consultant of discussion of heart healthy eating for all. Written material provided at class time.     Education: Nutrition 2 -Group instruction provided by verbal, written material, interactive activities, discussions, models, and posters to present general guidelines for heart healthy nutrition including sodium, cholesterol, and saturated fat. Providing guidance of habit forming to improve blood pressure, cholesterol, and body  weight. Written material provided at class time. Flowsheet Row Pulmonary Rehab from 07/10/2024 in [facility] Cardiac and Pulmonary Rehab  Date 07/10/24  Educator jg  Instruction Review Code 1- Verbalizes Understanding      Biometrics:  Pre Biometrics - 04/08/24 1031       Pre Biometrics   Height 6' 0.75 (1.848 m)    Weight 217 lb 3.2 oz (98.5 kg)    Waist Circumference 39.5 inches    Hip Circumference 43 inches    Waist to Hip Ratio 0.92 %    BMI (Calculated) 28.85    Single Leg Stand 2.9 seconds           Nutrition Therapy Plan and Nutrition Goals:  Nutrition Therapy & Goals - 04/08/24 1015       Nutrition Therapy   Diet Mediterranean    Protein (specify units) 70-90g    Fiber 30 grams    Whole Grain Foods 3 servings    Saturated Fats 15 max. grams    Fruits and Vegetables 5 servings/day    Sodium 2 grams      Personal Nutrition Goals   Nutrition Goal Eat 15-30gProtein and 30-60gCarbs at each meal.    Personal Goal #2 Eat 3 times per day, small frequent meals or nutrient dense snacks    Comments Patient drinking ~48oz of water  daily. He reports he usually eats 3 meals per day. Sometimes will miss lunch if he busy doing something. Encouraged him to have a snack if he thinks he might miss a meal. He agrees he should do that. Brainstormed several meal and snack ideas focusing on balancing carbs with protein or  healthy fats. Provided mediterranean diet handout. Educated on the importance of meeting his nutritional goals with COPD.      Intervention Plan   Intervention Prescribe, educate and counsel regarding individualized specific dietary modifications aiming towards targeted core components such as weight, hypertension, lipid management, diabetes, heart failure and other comorbidities.;Nutrition handout(s) given to patient.    Expected Outcomes Long Term Goal: Adherence to prescribed nutrition plan.;Short Term Goal: A plan has been developed with personal nutrition goals set during dietitian appointment.;Short Term Goal: Understand basic principles of dietary content, such as calories, fat, sodium, cholesterol and nutrients.          Nutrition Assessments:  MEDIFICTS Score Key: >=70 Need to make dietary changes  40-70 Heart Healthy Diet <= 40 Therapeutic Level Cholesterol Diet  Flowsheet Row Pulmonary Rehab from 04/08/2024 in [facility] Cardiac and Pulmonary Rehab  Picture Your Plate Total Score on Admission 75   Picture Your Plate Scores: <59 Unhealthy dietary pattern with much room for improvement. 41-50 Dietary pattern unlikely to meet recommendations for good health and room for improvement. 51-60 More healthful dietary pattern, with some room for improvement.  >60 Healthy dietary pattern, although there may be some specific behaviors that could be improved.   Nutrition Goals Re-Evaluation:  Nutrition Goals Re-Evaluation     Row Name 05/16/24 1425 05/30/24 1402 07/03/24 1425         Goals   Comment Patient was informed on why it is important to maintain a balanced diet when dealing with Respiratory issues. Explained that it takes a lot of energy to breath and when they are short of breath often they will need to have a good diet to help keep up with the calories they are expending for breathing. Dary is still trying to work on increasing his fiber intake, as well as his protein. Carlin  reports he is doing well with nutrition. Watching his salt and sodium intake. He reports he likes veggies and lean proteins like chicken and [place]. Says his biggest weakness is sugar and sweets. Says a few times a week he gets a craving for ice cream or cookies. Spoke with him about ways to control the portion and endulge in a controlled way     Expected Outcome Short: Choose and plan snacks accordingly to patients caloric intake to improve breathing. Long: Maintain a diet independently that meets their caloric intake to aid in daily shortness of breath. Short: Continue to increase fiber and protein intake. Long: Continue to manage his diet in order to manage his CKD. STG: Read facts labels and watch portions of sweets. LTG: Continue to manage his diet in order to manage his CKD.        Nutrition Goals Discharge (Final Nutrition Goals Re-Evaluation):  Nutrition Goals Re-Evaluation - 07/03/24 1425       Goals   Comment Gaston reports he is doing well with nutrition. Watching his salt and sodium intake. He reports he likes veggies and lean proteins like chicken and [place]. Says his biggest weakness is sugar and sweets. Says a few times a week he gets a craving for ice cream or cookies. Spoke with him about ways to control the portion and endulge in a controlled way    Expected Outcome STG: Read facts labels and watch portions of sweets. LTG: Continue to manage his diet in order to manage his CKD.          Psychosocial: Target Goals: Acknowledge presence or absence of significant depression and/or stress, maximize coping skills, provide positive support system. Participant is able to verbalize types and ability to use techniques and skills needed for reducing stress and depression.   Education: Stress, Anxiety, and Depression - Group verbal and visual presentation to define topics covered.  Reviews how body is impacted by stress, anxiety, and depression.  Also discusses healthy ways to reduce  stress and to treat/manage anxiety and depression.  Written material provided at class time.   Education: Sleep Hygiene -Provides group verbal and written instruction about how sleep can affect your health.  Define sleep hygiene, discuss sleep cycles and impact of sleep habits. Review good sleep hygiene tips.    Initial Review & Psychosocial Screening:  Initial Psych Review & Screening - 04/03/24 1021       Initial Review   Current issues with History of Depression      Family Dynamics   Good Support System? Yes   family, friends     Barriers   Psychosocial barriers to participate in program There are no identifiable barriers or psychosocial needs.;The patient should benefit from training in stress management and relaxation.      Screening Interventions   Interventions Encouraged to exercise    Expected Outcomes Short Term goal: Utilizing psychosocial counselor, staff and physician to assist with identification of specific Stressors or current issues interfering with healing process. Setting desired goal for each stressor or current issue identified.;Long Term Goal: Stressors or current issues are controlled or eliminated.;Short Term goal: Identification and review with participant of any Quality of Life or Depression concerns found by scoring the questionnaire.;Long Term goal: The participant improves quality of Life and PHQ9 Scores as seen by post scores and/or verbalization of changes          Quality of Life Scores:  Scores of 19 and below usually indicate a poorer quality of  life in these areas.  A difference of  2-3 points is a clinically meaningful difference.  A difference of 2-3 points in the total score of the Quality of Life Index has been associated with significant improvement in overall quality of life, self-image, physical symptoms, and general health in studies assessing change in quality of life.  PHQ-9: Review Flowsheet       05/16/2024 04/08/2024  Depression  screen PHQ 2/9  Decreased Interest 0 1  Down, Depressed, Hopeless 1 2  PHQ - 2 Score 1 3  Altered sleeping 1 0  Tired, decreased energy 1 1  Change in appetite 1 2  Feeling bad or failure about yourself  0 0  Trouble concentrating 1 1  Moving slowly or fidgety/restless 0 1  Suicidal thoughts 0 0  PHQ-9 Score 5 8  Difficult doing work/chores Somewhat difficult Somewhat difficult   Interpretation of Total Score  Total Score Depression Severity:  1-4 = Minimal depression, 5-9 = Mild depression, 10-14 = Moderate depression, 15-19 = Moderately severe depression, 20-27 = Severe depression   Psychosocial Evaluation and Intervention:  Psychosocial Evaluation - 04/03/24 1024       Psychosocial Evaluation & Interventions   Interventions Encouraged to exercise with the program and follow exercise prescription    Comments Mr. Hippe is coming to Pulmonary Rehab with COPD. He states he has no concerns currently with stress or anxiety/depression, but does have a history of PTSD. He has a great support system and feels like he is managing his health well. He is interested in learning more about risk factor management and increasing his stamina while in the program.    Expected Outcomes Short: attend pulmonary rehab for exercise and education. Long; develop and maintain positive self care habits    Continue Psychosocial Services  Follow up required by staff          Psychosocial Re-Evaluation:  Psychosocial Re-Evaluation     Row Name 05/16/24 1426 05/30/24 1359 07/03/24 1346         Psychosocial Re-Evaluation   Current issues with None Identified None Identified None Identified     Comments Reviewed patient health questionnaire (PHQ-9) with patient for follow up. Previously, patients score indicated signs/symptoms of depression.  Reviewed to see if patient is improving symptom wise while in program.  Score improved and patient states that it is because he has been able to exerise more.  Mortimer is doing well in the program. He states that he has no stressors at this time. He has a good support system made up of his daughter and wife. Exercise is a good outlet for Liliana, and has helped with him deal with his stress if he has any. Travoris is not dealing with any stress at this time, other than switching his car insurance. He states that he is sleeping well. He can rely on his wife and daughter for support if any stress arises. Exercise continues to be a stress reliever, and he continues exercise at the [place] [facility] on days he doesnt attend the program.     Expected Outcomes Short: Continue to attend [facility] regularly for regular exercise and social engagement. Long: Continue to improve symptoms and manage a positive mental state. Short: Continue to attend [facility] regularly for regular exercise and social engagement. Long: Continue to improve symptoms and manage a positive mental state. Short: Continue to attend [facility] regularly for regular exercise and social engagement. Long: Continue to improve symptoms and manage a positive mental state.  Interventions Encouraged to attend Pulmonary Rehabilitation for the exercise Encouraged to attend Pulmonary Rehabilitation for the exercise Encouraged to attend Pulmonary Rehabilitation for the exercise     Continue Psychosocial Services  Follow up required by staff Follow up required by staff Follow up required by staff        Psychosocial Discharge (Final Psychosocial Re-Evaluation):  Psychosocial Re-Evaluation - 07/03/24 1346       Psychosocial Re-Evaluation   Current issues with None Identified    Comments Amaury is not dealing with any stress at this time, other than switching his car insurance. He states that he is sleeping well. He can rely on his wife and daughter for support if any stress arises. Exercise continues to be a stress reliever, and he continues exercise at the [facility] on days he doesnt attend the program.    Expected Outcomes  Short: Continue to attend [facility] regularly for regular exercise and social engagement. Long: Continue to improve symptoms and manage a positive mental state.    Interventions Encouraged to attend Pulmonary Rehabilitation for the exercise    Continue Psychosocial Services  Follow up required by staff          Education: Education Goals: Education classes will be provided on a weekly basis, covering required topics. Participant will state understanding/return demonstration of topics presented.  Learning Barriers/Preferences:  Learning Barriers/Preferences - 04/03/24 1020       Learning Barriers/Preferences   Learning Barriers None    Learning Preferences None          General Pulmonary Education Topics:  Infection Prevention: - Provides verbal and written material to individual with discussion of infection control including proper hand washing and proper equipment cleaning during exercise session. Flowsheet Row Pulmonary Rehab from 07/10/2024 in [facility] Cardiac and Pulmonary Rehab  Date 04/08/24  Educator NT  Instruction Review Code 1- Verbalizes Understanding    Falls Prevention: - Provides verbal and written material to individual with discussion of falls prevention and safety. Flowsheet Row Pulmonary Rehab from 07/10/2024 in [facility] Cardiac and Pulmonary Rehab  Date 04/08/24  Educator NT  Instruction Review Code 1- Verbalizes Understanding    Chronic Lung Disease Review: - Group verbal instruction with posters, models, PowerPoint presentations and videos,  to review new updates, new respiratory medications, new advancements in procedures and treatments. Providing information on websites and 800 numbers for continued self-education. Includes information about supplement oxygen, available portable oxygen systems, continuous and intermittent flow rates, oxygen safety, concentrators, and Medicare reimbursement for oxygen. Explanation of Pulmonary Drugs, including class, frequency,  complications, importance of spacers, rinsing mouth after steroid MDI's, and proper cleaning methods for nebulizers. Review of basic lung anatomy and physiology related to function, structure, and complications of lung disease. Review of risk factors. Discussion about methods for diagnosing sleep apnea and types of masks and machines for OSA. Includes a review of the use of types of environmental controls: home humidity, furnaces, filters, dust mite/pet prevention, HEPA vacuums. Discussion about weather changes, air quality and the benefits of nasal washing. Instruction on Warning signs, infection symptoms, calling MD promptly, preventive modes, and value of vaccinations. Review of effective airway clearance, coughing and/or vibration techniques. Emphasizing that all should Create an Action Plan. Written material provided at class time. Flowsheet Row Pulmonary Rehab from 07/10/2024 in [facility] Cardiac and Pulmonary Rehab  Education need identified 04/08/24    AED/CPR: - Group verbal and written instruction with the use of models to demonstrate the basic use of the AED with the  basic ABC's of resuscitation.    Tests and Procedures:  - Group verbal and visual presentation and models provide information about basic cardiac anatomy and function. Reviews the testing methods done to diagnose heart disease and the outcomes of the test results. Describes the treatment choices: Medical Management, Angioplasty, or Coronary Bypass Surgery for treating various heart conditions including Myocardial Infarction, Angina, Valve Disease, and Cardiac Arrhythmias.  Written material provided at class time. Flowsheet Row Pulmonary Rehab from 07/10/2024 in [facility] Cardiac and Pulmonary Rehab  Date 05/08/24  Educator SB  Instruction Review Code 1- Verbalizes Understanding    Medication Safety: - Group verbal and visual instruction to review commonly prescribed medications for heart and lung disease. Reviews the medication, class  of the drug, and side effects. Includes the steps to properly store meds and maintain the prescription regimen.  Written material given at graduation.   Other: -Provides group and verbal instruction on various topics (see comments)   Knowledge Questionnaire Score:  Knowledge Questionnaire Score - 04/08/24 1020       Knowledge Questionnaire Score   Pre Score 15/18           Core Components/Risk Factors/Patient Goals at Admission:  Personal Goals and Risk Factors at Admission - 04/03/24 1019       Core Components/Risk Factors/Patient Goals on Admission    Weight Management Yes;Weight Maintenance    Intervention Weight Management: Develop a combined nutrition and exercise program designed to reach desired caloric intake, while maintaining appropriate intake of nutrient and fiber, sodium and fats, and appropriate energy expenditure required for the weight goal.;Weight Management: Provide education and appropriate resources to help participant work on and attain dietary goals.;Weight Management/Obesity: Establish reasonable short term and long term weight goals.    Expected Outcomes Weight Maintenance: Understanding of the daily nutrition guidelines, which includes 25-35% calories from fat, 7% or less cal from saturated fats, less than 200mg  cholesterol, less than 1.5gm of sodium, & 5 or more servings of fruits and vegetables daily;Long Term: Adherence to nutrition and physical activity/exercise program aimed toward attainment of established weight goal;Short Term: Continue to assess and modify interventions until short term weight is achieved    Improve shortness of breath with ADL's Yes    Intervention Provide education, individualized exercise plan and daily activity instruction to help decrease symptoms of SOB with activities of daily living.    Expected Outcomes Short Term: Improve cardiorespiratory fitness to achieve a reduction of symptoms when performing ADLs;Long Term: Be able to  perform more ADLs without symptoms or delay the onset of symptoms    Hypertension Yes    Intervention Provide education on lifestyle modifcations including regular physical activity/exercise, weight management, moderate sodium restriction and increased consumption of fresh fruit, vegetables, and low fat dairy, alcohol  moderation, and smoking cessation.;Monitor prescription use compliance.    Expected Outcomes Short Term: Continued assessment and intervention until BP is < 140/55mm HG in hypertensive participants. < 130/19mm HG in hypertensive participants with diabetes, heart failure or chronic kidney disease.;Long Term: Maintenance of blood pressure at goal levels.    Lipids Yes    Intervention Provide education and support for participant on nutrition & aerobic/resistive exercise along with prescribed medications to achieve LDL 70mg , HDL >40mg .    Expected Outcomes Short Term: Participant states understanding of desired cholesterol values and is compliant with medications prescribed. Participant is following exercise prescription and nutrition guidelines.;Long Term: Cholesterol controlled with medications as prescribed, with individualized exercise RX and with personalized nutrition plan. Value  goals: LDL < 70mg , HDL > 40 mg.          Education:Diabetes - Individual verbal and written instruction to review signs/symptoms of diabetes, desired ranges of glucose level fasting, after meals and with exercise. Acknowledge that pre and post exercise glucose checks will be done for 3 sessions at entry of program.   Know Your Numbers and Heart Failure: - Group verbal and visual instruction to discuss disease risk factors for cardiac and pulmonary disease and treatment options.  Reviews associated critical values for Overweight/Obesity, Hypertension, Cholesterol, and Diabetes.  Discusses basics of heart failure: signs/symptoms and treatments.  Introduces Heart Failure Zone chart for action plan for heart  failure. Written material provided at class time. Flowsheet Row Pulmonary Rehab from 07/10/2024 in [facility]  Date 05/22/24  Educator SB  Instruction Review Code 1- Verbalizes Understanding    Core Components/Risk Factors/Patient Goals Review:   Goals and Risk Factor Review     Row Name 05/16/24 1424 05/30/24 1405 07/03/24 1351         Core Components/Risk Factors/Patient Goals Review   Personal Goals Review Weight Management/Obesity Weight Management/Obesity;Improve shortness of breath with ADL's Weight Management/Obesity;Improve shortness of breath with ADL's     Review Spoke to patient about their shortness of breath and what they can do to improve. Patient has been informed of breathing techniques when starting the program. Patient is informed to tell staff if they have had any med changes and that certain meds they are taking or not taking can be causing shortness of breath. Tyrese continues to work to maintain his weight. He states that he is trying to keep his belly fat under control. He Continues to see improvements with his shortness of breath. Xavius is at a comfortable weight. He states that he has lost about 20lbs since last year and is proud of himself for that. He continues to [place] his shortness of breath. Advised patient to a pulse ox in order to check his oxygen sats at [facility].     Expected Outcomes Short: Attend [facility] regularly to improve shortness of breath with ADL's. Long: maintain independence with ADL's Short: Attend [facility] regularly to improve shortness of breath with ADL's. Long: maintain independence with ADL's Short: Attend [facility] regularly to improve shortness of breath with ADL's. Long: maintain independence with ADL's        Core Components/Risk Factors/Patient Goals at Discharge (Final Review):   Goals and Risk Factor Review - 07/03/24 1351       Core Components/Risk Factors/Patient Goals Review   Personal Goals Review  Weight Management/Obesity;Improve shortness of breath with ADL's    Review Saeed is at a comfortable weight. He states that he has lost about 20lbs since last year and is proud of himself for that. He continues to [place] his shortness of breath. Advised patient to a pulse ox in order to check his oxygen sats at [facility].    Expected Outcomes Short: Attend [facility] regularly to improve shortness of breath with ADL's. Long: maintain independence with ADL's          ITP Comments:  ITP Comments     Row Name 04/03/24 1026 04/08/24 1028 04/10/24 1351 04/24/24 1114 05/22/24 1132   ITP Comments Initial phone call completed. Diagnosis can be found in [facility] 5/7. EP Orientation scheduled for Monday 5/19 at 9am. Completed and gym orientation for pulmonary rehab. Initial ITP created and sent for review to Dr. Faud Aleskerov, Medical Director. First full day  of exercise!  Patient was oriented to gym and equipment including functions, settings, policies, and procedures.  Patient's individual exercise prescription and treatment plan were reviewed.  All starting workloads were established based on the results of the 6 minute walk test done at initial orientation visit.  The plan for exercise progression was also introduced and progression will be customized based on patient's performance and goals. 30 Day review completed. Medical Director ITP review done, changes made as directed, and signed approval by Medical Director.    new to program 30 Day review completed. Medical Director ITP review done, changes made as directed, and signed approval by Medical Director.    Row Name 06/19/24 1019 07/17/24 1055         ITP Comments 30 Day review completed. Medical Director ITP review done, changes made as directed, and signed approval by Medical Director. 30 Day review completed. Medical Director ITP review done, changes made as directed, and signed approval by Medical Director.         Comments: 30 day  review

## 2024-07-18 ENCOUNTER — Encounter: Admitting: Emergency Medicine

## 2024-07-18 VITALS — Ht 72.75 in | Wt 215.1 lb

## 2024-07-18 DIAGNOSIS — J449 Chronic obstructive pulmonary disease, unspecified: Secondary | ICD-10-CM | POA: Diagnosis not present

## 2024-07-18 NOTE — Progress Notes (Signed)
 Daily Session Note  Patient Details  Name: Wesley Weber MRN: [id number] Date of Birth: Sep 06, 1946 Referring Provider:   [name] Pulmonary Rehab from 04/08/2024 in [facility] Cardiac and Pulmonary Rehab  Referring Provider Dr. Jereld Boos, MD    Encounter Date: 07/18/2024  Check In:  Session Check In - 07/18/24 1346       Check-In   Supervising physician immediately available to respond to emergencies See telemetry face sheet for immediately available ER MD    Location [facility]-Cardiac & Pulmonary Rehab    Staff Present Devaughn Jaeger, BS, Exercise Physiologist;Maxon Conetta BS, Exercise Physiologist;Joseph Rolinda RCP,RRT,BSRT;Yarielis Funaro RN,BSN    Virtual Visit No    Medication changes reported     No    Fall or balance concerns reported    No    Warm-up and Cool-down Performed on first and last piece of equipment    Resistance Training Performed Yes    VAD Patient? No    PAD/SET Patient? No      Pain Assessment   Currently in Pain? No/denies             Social History   Tobacco Use  Smoking Status Former   Current packs/day: 0.00   Types: Cigarettes   Start date: 09/06/1976   Quit date: 09/06/1986   Years since quitting: 37.8  Smokeless Tobacco Never    Goals Met:  Proper associated with RPD/PD & O2 Sat Independence with exercise equipment Using PLB without cueing & demonstrates good technique Exercise tolerated well No report of concerns or symptoms today Strength training completed today  Goals Unmet:  Not Applicable  Comments: Pt able to follow exercise prescription today without complaint.  Will continue to monitor for progression.    Dr. Oneil Pinal is Medical Director for [facility] Cardiac Rehabilitation.  Dr. Fuad Aleskerov is Medical Director for [facility] Pulmonary Rehabilitation.

## 2024-07-18 NOTE — Patient Instructions (Signed)
 Discharge Patient Instructions  Patient Details  Name: Wesley Weber MRN: [id number] Date of Birth: 11/25/1945 Referring Provider:  Lauran [facility]   Number of Visits: 36  Reason for Discharge:  Patient reached a stable level of exercise. Patient independent in their exercise. Patient has met program and personal goals.  Diagnosis:  Chronic obstructive pulmonary disease, unspecified COPD type (HCC)  Functional Capacity:  6 Minute Walk     Row Name 04/08/24 1032 07/18/24 1357       6 Minute Walk   Phase Initial Discharge    Distance 1165 feet 1220 feet    Distance % Change -- 5 %    Distance Feet Change -- 55 ft    Walk Time 6 minutes 6 minutes    # of Rest Breaks 0 0    MPH 2.21 2.31    METS 2.11 2.2    RPE 12 13    Perceived Dyspnea  2 2    VO2 Peak 7.38 7.82    Symptoms No No    Resting HR 55 bpm 59 bpm    Resting BP 112/58 124/60    Resting Oxygen Saturation  99 % 98 %    Exercise Oxygen Saturation  during 6 min walk 97 % 98 %    Max Ex. HR 88 bpm 93 bpm    Max Ex. BP 148/62 142/60    2 Minute Post BP 122/58 124/56      Interval HR   1 Minute HR 78 80    2 Minute HR 79 93    3 Minute HR 82 93    4 Minute HR 84 93    5 Minute HR 88 93    6 Minute HR 87 73    2 Minute Post HR 55 64    Interval Heart Rate? Yes Yes      Interval Oxygen   Interval Oxygen? Yes Yes    Baseline Oxygen Saturation % 99 % 98 %    1 Minute Oxygen Saturation % 98 % 98 %    1 Minute Liters of Oxygen 0 L  RA 0 L    2 Minute Oxygen Saturation % 98 % 98 %    2 Minute Liters of Oxygen 0 L --    3 Minute Oxygen Saturation % 98 % 98 %    3 Minute Liters of Oxygen 0 L 0 L    4 Minute Oxygen Saturation % 97 % 98 %    4 Minute Liters of Oxygen 0 L 0 L    5 Minute Oxygen Saturation % 98 % 98 %    5 Minute Liters of Oxygen 0 L 0 L    6 Minute Oxygen Saturation % 99 % 98 %    6 Minute Liters of Oxygen 0 L 0 L    2 Minute Post Oxygen Saturation % 99 % 98 %    2 Minute Post  Liters of Oxygen 0 L 0 L      Nutrition & Weight - Outcomes:  Pre Biometrics - 04/08/24 1031       Pre Biometrics   Height 6' 0.75 (1.848 m)    Weight 217 lb 3.2 oz (98.5 kg)    Waist Circumference 39.5 inches    Hip Circumference 43 inches    Waist to Hip Ratio 0.92 %    BMI (Calculated) 28.85    Single Leg Stand 2.9 seconds  Post Biometrics - 07/18/24 1359        Post  Biometrics   Height 6' 0.75 (1.848 m)    Weight 215 lb 1.6 oz (97.6 kg)    Waist Circumference 41 inches    Hip Circumference 42 inches    Waist to Hip Ratio 0.98 %    BMI (Calculated) 28.57    Single Leg Stand 4 seconds         Goals reviewed with patient; copy given to patient.

## 2024-07-24 ENCOUNTER — Encounter: Admitting: Emergency Medicine

## 2024-07-24 DIAGNOSIS — J449 Chronic obstructive pulmonary disease, unspecified: Secondary | ICD-10-CM | POA: Insufficient documentation

## 2024-07-24 NOTE — Progress Notes (Signed)
 Daily Session Note  Patient Details  Name: Wesley Weber MRN: [id number] Date of Birth: [birth year] Referring Provider:   [name] Pulmonary Rehab from 04/08/2024 in [facility] Cardiac and Pulmonary Rehab  Referring Provider Dr. Jereld Boos, MD    Encounter Date: 07/24/2024  Check In:  Session Check In - 07/24/24 1349       Check-In   Supervising physician immediately available to respond to emergencies See telemetry face sheet for immediately available ER MD    Location [facility]-Cardiac & Pulmonary Rehab    Staff Present Devaughn Jaeger, BS, Exercise Physiologist;Maxon Conetta BS, Exercise Physiologist;Pegi Milazzo RN,BSN;Meredith Tressa RN,BSN    Virtual Visit No    Medication changes reported     No    Fall or balance concerns reported    No    Warm-up and Cool-down Performed on first and last piece of equipment    Resistance Training Performed Yes    VAD Patient? No    PAD/SET Patient? No      Pain Assessment   Currently in Pain? No/denies             Social History   Tobacco Use  Smoking Status Former   Current packs/day: 0.00   Types: Cigarettes   Start date: 09/06/1976   Quit date: 09/06/1986   Years since quitting: 37.9  Smokeless Tobacco Never    Goals Met:  Proper associated with RPD/PD & O2 Sat Independence with exercise equipment Using PLB without cueing & demonstrates good technique Exercise tolerated well No report of concerns or symptoms today Strength training completed today  Goals Unmet:  Not Applicable  Comments: Pt able to follow exercise prescription today without complaint.  Will continue to monitor for progression.    Dr. Oneil Pinal is Medical Director for [facility] Cardiac Rehabilitation.  Dr. Fuad Aleskerov is Medical Director for [facility] Pulmonary Rehabilitation.

## 2024-07-25 ENCOUNTER — Encounter: Admitting: Emergency Medicine

## 2024-07-25 DIAGNOSIS — J449 Chronic obstructive pulmonary disease, unspecified: Secondary | ICD-10-CM | POA: Diagnosis not present

## 2024-07-25 NOTE — Progress Notes (Signed)
 Daily Session Note  Patient Details  Name: Wesley Weber MRN: [id number] Date of Birth: Jul 19, 1946 Referring Provider:   [name] Pulmonary Rehab from 04/08/2024 in [facility] Cardiac and Pulmonary Rehab  Referring Provider Dr. Jereld Boos, MD    Encounter Date: 07/25/2024  Check In:  Session Check In - 07/25/24 1345       Check-In   Supervising physician immediately available to respond to emergencies See telemetry face sheet for immediately available ER MD    Location [facility]-Cardiac & Pulmonary Rehab    Staff Present Rollene Paterson, MS, Exercise Physiologist;Noah Tickle, BS, Exercise Physiologist;Joseph Rolinda RCP,RRT,BSRT;Baruch Lewers RN,BSN    Virtual Visit No    Medication changes reported     No    Fall or balance concerns reported    No    Warm-up and Cool-down Performed on first and last piece of equipment    Resistance Training Performed Yes    VAD Patient? No    PAD/SET Patient? No      Pain Assessment   Currently in Pain? No/denies             Social History   Tobacco Use  Smoking Status Former   Current packs/day: 0.00   Types: Cigarettes   Start date: 09/06/1976   Quit date: 09/06/1986   Years since quitting: 37.9  Smokeless Tobacco Never    Goals Met:  Proper associated with RPD/PD & O2 Sat Independence with exercise equipment Using PLB without cueing & demonstrates good technique Exercise tolerated well No report of concerns or symptoms today Strength training completed today  Goals Unmet:  Not Applicable  Comments: Pt able to follow exercise prescription today without complaint.  Will continue to monitor for progression.    Dr. Oneil Pinal is Medical Director for [facility] Cardiac Rehabilitation.  Dr. Fuad Aleskerov is Medical Director for [facility] Pulmonary Rehabilitation.

## 2024-07-31 ENCOUNTER — Encounter

## 2024-07-31 DIAGNOSIS — J449 Chronic obstructive pulmonary disease, unspecified: Secondary | ICD-10-CM

## 2024-07-31 NOTE — Progress Notes (Signed)
 Daily Session Note  Patient Details  Name: Wesley Weber MRN: [id number] Date of Birth: 1946/04/03 Referring Provider:   [name] Pulmonary Rehab from 04/08/2024 in [facility] Cardiac and Pulmonary Rehab  Referring Provider Dr. Jereld Boos, MD    Encounter Date: 07/31/2024  Check In:  Session Check In - 07/31/24 1402       Check-In   Supervising physician immediately available to respond to emergencies See telemetry face sheet for immediately available ER MD    Location [facility]-Cardiac & Pulmonary Rehab    Staff Present Burnard Davenport RN,BSN,MPA;Laura Cates RN,BSN;Angellee Cohill Dyane BS, ACSM CEP, Exercise Physiologist;Noah Tickle, BS, Exercise Physiologist    Virtual Visit No    Medication changes reported     No    Fall or balance concerns reported    No    Warm-up and Cool-down Performed on first and last piece of equipment    Resistance Training Performed Yes    VAD Patient? No    PAD/SET Patient? No      Pain Assessment   Currently in Pain? No/denies             Social History   Tobacco Use  Smoking Status Former   Current packs/day: 0.00   Types: Cigarettes   Start date: 09/06/1976   Quit date: 09/06/1986   Years since quitting: 37.9  Smokeless Tobacco Never    Goals Met:  Proper associated with RPD/PD & O2 Sat Independence with exercise equipment Using PLB without cueing & demonstrates good technique Exercise tolerated well No report of concerns or symptoms today Strength training completed today  Goals Unmet:  Not Applicable  Comments: Pt able to follow exercise prescription today without complaint.  Will continue to monitor for progression.    Dr. Oneil Pinal is Medical Director for [facility] Cardiac Rehabilitation.  Dr. Fuad Aleskerov is Medical Director for [facility] Pulmonary Rehabilitation.

## 2024-08-01 ENCOUNTER — Encounter: Admitting: Emergency Medicine

## 2024-08-01 DIAGNOSIS — J449 Chronic obstructive pulmonary disease, unspecified: Secondary | ICD-10-CM | POA: Diagnosis not present

## 2024-08-01 NOTE — Progress Notes (Signed)
 Discharge Summary   Wesley Weber  DOB[birth year]   [name] graduated today from  rehab with 35 sessions completed.  Details of the patient's exercise prescription and what He needs to do in order to continue the prescription and progress were discussed with patient.  Patient was given a copy of prescription and goals.  Patient verbalized understanding. [name] plans to continue to exercise by walking and joining the [employer] and [place] exercise classes.   6 Minute Walk     Row Name 04/08/24 1032 07/18/24 1357       6 Minute Walk   Phase Initial Discharge    Distance 1165 feet 1220 feet    Distance % Change -- 5 %    Distance Feet Change -- 55 ft    Walk Time 6 minutes 6 minutes    # of Rest Breaks 0 0    MPH 2.21 2.31    METS 2.11 2.2    RPE 12 13    Perceived Dyspnea  2 2    VO2 Peak 7.38 7.82    Symptoms No No    Resting HR 55 bpm 59 bpm    Resting BP 112/58 124/60    Resting Oxygen Saturation  99 % 98 %    Exercise Oxygen Saturation  during 6 min walk 97 % 98 %    Max Ex. HR 88 bpm 93 bpm    Max Ex. BP 148/62 142/60    2 Minute Post BP 122/58 124/56      Interval HR   1 Minute HR 78 80    2 Minute HR 79 93    3 Minute HR 82 93    4 Minute HR 84 93    5 Minute HR 88 93    6 Minute HR 87 73    2 Minute Post HR 55 64    Interval Heart Rate? Yes Yes      Interval Oxygen   Interval Oxygen? Yes Yes    Baseline Oxygen Saturation % 99 % 98 %    1 Minute Oxygen Saturation % 98 % 98 %    1 Minute Liters of Oxygen 0 L  RA 0 L    2 Minute Oxygen Saturation % 98 % 98 %    2 Minute Liters of Oxygen 0 L --    3 Minute Oxygen Saturation % 98 % 98 %    3 Minute Liters of Oxygen 0 L 0 L    4 Minute Oxygen Saturation % 97 % 98 %    4 Minute Liters of Oxygen 0 L 0 L    5 Minute Oxygen Saturation % 98 % 98 %    5 Minute Liters of Oxygen 0 L 0 L    6 Minute Oxygen Saturation % 99 % 98 %    6 Minute Liters of Oxygen 0 L 0 L    2 Minute Post Oxygen Saturation % 99 % 98 %    2 Minute  Post Liters of Oxygen 0 L 0 L

## 2024-08-01 NOTE — Progress Notes (Signed)
 Pulmonary Individual Treatment Plan  Patient Details  Name: Wesley Weber MRN: [id number] Date of Birth: Jan 03, 1946 Referring Provider:   [name] Re[facility] from 04/08/2024 in [facility] Cardiac and Pulmonary Rehab  Referring Provider Dr. Jereld Boos, MD    Initial Encounter Date:  Flowsheet Row Pulmonary Rehab from 04/08/2024 in [facility] Cardiac and Pulmonary Rehab  Date 04/08/24    Visit Diagnosis: Chronic obstructive pulmonary disease, unspecified COPD type (HCC)  Patient's Home Medications on Admission:  Current Outpatient Medications:    albuterol  (PROVENTIL  HFA;VENTOLIN  HFA) 108 (90 Base) MCG/ACT inhaler, Inhale 2 puffs into the lungs every 4 (four) hours as needed for wheezing or shortness of breath., Disp: , Rfl:    amLODipine  (NORVASC ) 5 MG tablet, Take 5 mg by mouth daily., Disp: , Rfl:    aspirin  EC 81 MG tablet, Take 81 mg by mouth daily. (Patient not taking: Reported on 04/03/2024), Disp: , Rfl:    azelastine (ASTELIN) 0.1 % nasal spray, Place 1 spray into both nostrils 2 (two) times daily. Use in each nostril as directed (Patient not taking: Reported on 04/03/2024), Disp: , Rfl:    budesonide (RHINOCORT AQUA) 32 MCG/ACT nasal spray, Place 1 spray into both nostrils 2 (two) times daily., Disp: , Rfl:    chlorpheniramine (CHLOR-TRIMETON) 4 MG tablet, Take 4 mg by mouth daily as needed for allergies. (Patient not taking: Reported on 04/03/2024), Disp: , Rfl:    Cholecalciferol  (VITAMIN D ) 2000 units CAPS, Take 2,000 Units by mouth daily. , Disp: , Rfl:    diclofenac Sodium (VOLTAREN) 1 % GEL, Apply 4 g topically 3 (three) times daily., Disp: , Rfl:    famotidine (PEPCID) 40 MG tablet, Take 40 mg by mouth 2 (two) times daily., Disp: , Rfl:    ferrous sulfate  325 (65 FE) MG tablet, Take 325 mg by mouth daily with breakfast., Disp: , Rfl:    fluticasone  (FLONASE ) 50 MCG/ACT nasal spray, Place 1 spray into both nostrils 2 (two) times daily. (Patient not taking: Reported on  04/03/2024), Disp: , Rfl:    ipratropium (ATROVENT) 0.06 % nasal spray, Place 2 sprays into both nostrils 2 (two) times daily., Disp: , Rfl:    ketotifen (ZADITOR) 0.035 % ophthalmic solution, Place 1 drop into both eyes 2 (two) times daily., Disp: , Rfl:    LIVALO 1 MG TABS, Take 1 tablet by mouth daily. (Patient not taking: Reported on 04/03/2024), Disp: , Rfl:    loratadine (CLARITIN) 10 MG tablet, Take 1 tablet by mouth daily as needed. (Patient not taking: Reported on 04/03/2024), Disp: , Rfl:    mometasone (ASMANEX) 220 MCG/INH inhaler, Inhale 2 puffs into the lungs at bedtime. , Disp: , Rfl:    omeprazole (PRILOSEC) 40 MG capsule, Take 40 mg by mouth daily. (Patient not taking: Reported on 04/03/2024), Disp: , Rfl:    polyethylene glycol (MIRALAX ) 17 g packet, Take 17 g by mouth daily., Disp: 30 packet, Rfl: 2   sildenafil (VIAGRA) 100 MG tablet, Take 100 mg by mouth daily as needed for erectile dysfunction., Disp: , Rfl:    valsartan (DIOVAN) 160 MG tablet, Take 160 mg by mouth daily. , Disp: , Rfl:   Past Medical History: Past Medical History:  Diagnosis Date   Asthma    followed by [facility] [place]   Benign localized prostatic hyperplasia with lower urinary tract symptoms (LUTS)    urologist-- dr sherrilee--  s/p urolift twice in 2019   Chronic constipation    Chronic dryness of  both eyes    Elevated PSA    Foley catheter in place    GERD (gastroesophageal reflux disease)    Gross hematuria    History of prostatitis    Hyperlipidemia    Hypertension    followed by pcp  (11-01-2019 per pt had nuclear stress test done @[facility]  approx. 2018, told is was normal)   IDA (iron deficiency anemia)    OSA on CPAP    uses nightly   Wears glasses    Wears hearing aid in both ears     Tobacco Use: Social History   Tobacco Use  Smoking Status Former   Current packs/day: 0.00   Types: Cigarettes   Start date: 09/06/1976   Quit date: 09/06/1986   Years since quitting: 37.9  Smokeless  Tobacco Never    Labs: Review Flowsheet       Latest Ref Rng & Units 11/04/2019  Labs for ITP Cardiac and Pulmonary Rehab  TCO2 22 - 32 mmol/L 24      Pulmonary Assessment Scores:  Pulmonary Assessment Scores     Row Name 04/08/24 1021         ADL UCSD   ADL Phase Entry     SOB Score total 64     Rest 2     Walk 3     Stairs 4     Bath 2     Dress 2     Shop 2       CAT Score   CAT Score 22       mMRC Score   mMRC Score 1        UCSD: Self-administered rating of dyspnea associated with activities of daily living (ADLs) 6-point scale (0 = not at all to 5 = maximal or unable to do because of breathlessness)  Scoring Scores range from 0 to 120.  Minimally important difference is 5 units  CAT: CAT can identify the health impairment of COPD patients and is better correlated with disease progression.  CAT has a scoring range of zero to 40. The CAT score is classified into four groups of low (less than 10), medium (10 - 20), high (21-30) and very high (31-40) based on the impact level of disease on health status. A CAT score over 10 suggests significant symptoms.  A worsening CAT score could be explained by an exacerbation, poor medication adherence, poor inhaler technique, or progression of COPD or comorbid conditions.  CAT MCID is 2 points  mMRC: mMRC (Modified Medical Research Council) Dyspnea Scale is used to assess the degree of baseline functional disability in patients of respiratory disease due to dyspnea. No minimal important difference is established. A decrease in score of 1 point or greater is considered a positive change.   Pulmonary Function Assessment:   Exercise Target Goals: Exercise Program Goal: Individual exercise prescription set using results from initial 6 min walk test and THRR while considering  patient's activity barriers and safety.   Exercise Prescription Goal: Initial exercise prescription builds to 30-45 minutes a day of aerobic  activity, 2-3 days per week.  Home exercise guidelines will be given to patient during program as part of exercise prescription that the participant will acknowledge.  Education: Aerobic Exercise: - Group verbal and visual presentation on the components of exercise prescription. Introduces F.I.T.T principle from ACSM for exercise prescriptions.  Reviews F.I.T.T. principles of aerobic exercise including progression. Written material provided at class time. Flowsheet Row Pulmonary Rehab from 07/17/2024 in [facility] Cardiac  and Pulmonary Rehab  Date 04/17/24  Educator [facility]  Instruction Review Code 1- Verbalizes Understanding    Education: Resistance Exercise: - Group verbal and visual presentation on the components of exercise prescription. Introduces F.I.T.T principle from ACSM for exercise prescriptions  Reviews F.I.T.T. principles of resistance exercise including progression. Written material provided at class time. Flowsheet Row Pulmonary Rehab from 07/17/2024 in [facility] Cardiac and Pulmonary Rehab  Date 04/10/24  Educator [facility]  Instruction Review Code 1- [employer] Understanding     Education: Exercise & Equipment Safety: - Individual verbal instruction and demonstration of equipment use and safety with use of the equipment. Flowsheet Row Pulmonary Rehab from 07/17/2024 in [facility] Cardiac and Pulmonary Rehab  Date 04/08/24  Educator [facility]  Instruction Review Code 1- Verbalizes Understanding    Education: Exercise Physiology & General Exercise Guidelines: - Group verbal and written instruction with models to review the exercise physiology of the cardiovascular system and associated critical values. Provides general exercise guidelines with specific guidelines to those with heart or lung disease.  Flowsheet Row Pulmonary Rehab from 07/17/2024 in [facility] Cardiac and Pulmonary Rehab  Date 06/12/24  Educator [facility]  Instruction Review Code 1- [employer] Understanding    Education: Flexibility, Balance, Mind/Body  Relaxation: - Group verbal and visual presentation with interactive activity on the components of exercise prescription. Introduces F.I.T.T principle from ACSM for exercise prescriptions. Reviews F.I.T.T. principles of flexibility and balance exercise training including progression. Also discusses the mind body connection.  Reviews various relaxation techniques to help reduce and manage stress (i.e. Deep breathing, progressive muscle relaxation, and visualization). Balance handout provided to take home. Written material provided at class time. Flowsheet Row Pulmonary Rehab from 07/17/2024 in [facility] Cardiac and Pulmonary Rehab  Date 04/10/24  Educator [facility]  Instruction Review Code 1- Verbalizes Understanding    Activity Barriers & Risk Stratification:  Activity Barriers & Cardiac Risk Stratification - 04/03/24 1015       Activity Barriers & Cardiac Risk Stratification   Activity Barriers Joint Problems;Back Problems;Balance Concerns;Muscular Weakness   right ankle replacement         6 Minute Walk:  6 Minute Walk     Row Name 04/08/24 1032 07/18/24 1357       6 Minute Walk   Phase Initial Discharge    Distance 1165 feet 1220 feet    Distance % Change -- 5 %    Distance Feet Change -- 55 ft    Walk Time 6 minutes 6 minutes    # of Rest Breaks 0 0    MPH 2.21 2.31    METS 2.11 2.2    RPE 12 13    Perceived Dyspnea  2 2    VO2 Peak 7.38 7.82    Symptoms No No    Resting HR 55 bpm 59 bpm    Resting BP 112/58 124/60    Resting Oxygen Saturation  99 % 98 %    Exercise Oxygen Saturation  during 6 min walk 97 % 98 %    Max Ex. HR 88 bpm 93 bpm    Max Ex. BP 148/62 142/60    2 Minute Post BP 122/58 124/56      Interval HR   1 Minute HR 78 80    2 Minute HR 79 93    3 Minute HR 82 93    4 Minute HR 84 93    5 Minute HR 88 93    6 Minute HR 87 73  2 Minute Post HR 55 64    Interval Heart Rate? Yes Yes      Interval Oxygen   Interval Oxygen? Yes Yes    Baseline Oxygen  Saturation % 99 % 98 %    1 Minute Oxygen Saturation % 98 % 98 %    1 Minute Liters of Oxygen 0 L  RA 0 L    2 Minute Oxygen Saturation % 98 % 98 %    2 Minute Liters of Oxygen 0 L --    3 Minute Oxygen Saturation % 98 % 98 %    3 Minute Liters of Oxygen 0 L 0 L    4 Minute Oxygen Saturation % 97 % 98 %    4 Minute Liters of Oxygen 0 L 0 L    5 Minute Oxygen Saturation % 98 % 98 %    5 Minute Liters of Oxygen 0 L 0 L    6 Minute Oxygen Saturation % 99 % 98 %    6 Minute Liters of Oxygen 0 L 0 L    2 Minute Post Oxygen Saturation % 99 % 98 %    2 Minute Post Liters of Oxygen 0 L 0 L      Oxygen Initial Assessment:  Oxygen Initial Assessment - 04/03/24 1017       Home Oxygen   Home Oxygen Device None    Sleep Oxygen Prescription CPAP    Home Exercise Oxygen Prescription None    Home Resting Oxygen Prescription None    Compliance with Home Oxygen Use Yes      Intervention   Short Term Goals To learn and understand importance of monitoring SPO2 with pulse oximeter and demonstrate accurate use of the pulse oximeter.;To learn and understand importance of maintaining oxygen saturations>88%;To learn and demonstrate proper pursed lip breathing techniques or other breathing techniques. ;To learn and demonstrate proper use of respiratory medications    Long  Term Goals Verbalizes importance of monitoring SPO2 with pulse oximeter and return demonstration;Maintenance of O2 saturations>88%;Exhibits proper breathing techniques, such as pursed lip breathing or other method taught during program session;Compliance with respiratory medication;Demonstrates proper use of MDI's          Oxygen Re-Evaluation:  Oxygen Re-Evaluation     Row Name 05/16/24 1421 05/30/24 1409 07/03/24 1357         Program Oxygen Prescription   Program Oxygen Prescription None None None       Home Oxygen   Home Oxygen Device None None None     Sleep Oxygen Prescription CPAP CPAP CPAP     Liters per minute 0 0 0      Home Exercise Oxygen Prescription None None None     Home Resting Oxygen Prescription None None None     Compliance with Home Oxygen Use Yes Yes Yes       Goals/Expected Outcomes   Short Term Goals To learn and demonstrate proper pursed lip breathing techniques or other breathing techniques.  To learn and demonstrate proper pursed lip breathing techniques or other breathing techniques.  To learn and demonstrate proper pursed lip breathing techniques or other breathing techniques.      Long  Term Goals Exhibits proper breathing techniques, such as pursed lip breathing or other method taught during program session Exhibits proper breathing techniques, such as pursed lip breathing or other method taught during program session Exhibits proper breathing techniques, such as pursed lip breathing or other method taught during program session  Comments Informed patient how to perform the Pursed Lipped breathing technique. Told patient to Inhale through the nose and out the mouth with pursed lips to keep their airways open, help oxygenate them better, practice when at rest or doing strenuous activity. Patient Verbalizes understanding of technique and will work on and be reiterated during [employer]. Informed patient how to perform the Pursed Lipped breathing technique. Told patient to Inhale through the nose and out the mouth with pursed lips to keep their airways open, help oxygenate them better, practice when at rest or doing strenuous activity. Patient Verbalizes understanding of technique and will work on and be reiterated during [facility]. Informed patient how to perform the Pursed Lipped breathing technique. Told patient to Inhale through the nose and out the mouth with pursed lips to keep their airways open, help oxygenate them better, practice when at rest or doing strenuous activity. Patient Verbalizes understanding of technique and will work on and be reiterated during [facility].     Goals/Expected  Outcomes Short: use PLB with exertion. Long: use PLB on exertion proficiently and independently. Short: use PLB with exertion. Long: use PLB on exertion proficiently and independently. Short: use PLB with exertion. Long: use PLB on exertion proficiently and independently.        Oxygen Discharge (Final Oxygen Re-Evaluation):  Oxygen Re-Evaluation - 07/03/24 1357       Program Oxygen Prescription   Program Oxygen Prescription None      Home Oxygen   Home Oxygen Device None    Sleep Oxygen Prescription CPAP    Liters per minute 0    Home Exercise Oxygen Prescription None    Home Resting Oxygen Prescription None    Compliance with Home Oxygen Use Yes      Goals/Expected Outcomes   Short Term Goals To learn and demonstrate proper pursed lip breathing techniques or other breathing techniques.     Long  Term Goals Exhibits proper breathing techniques, such as pursed lip breathing or other method taught during program session    Comments Informed patient how to perform the Pursed Lipped breathing technique. Told patient to Inhale through the nose and out the mouth with pursed lips to keep their airways open, help oxygenate them better, practice when at rest or doing strenuous activity. Patient Verbalizes understanding of technique and will work on and be reiterated during [facility].    Goals/Expected Outcomes Short: use PLB with exertion. Long: use PLB on exertion proficiently and independently.          Initial Exercise Prescription:  Initial Exercise Prescription - 04/08/24 1000       Date of Initial Exercise RX and Referring Provider   Date 04/08/24    Referring Provider Dr. Jereld Boos, MD      Oxygen   Maintain Oxygen Saturation 88% or higher      Treadmill   MPH 2.5    Grade 0    Minutes 15    METs 2.91      NuStep   Level 2   T6 nustep   SPM 80    Minutes 15    METs 2.11      REL-XR   Level 2    Speed 50    Minutes 15    METs 2.11      Prescription Details    Frequency (times per week) 2    Duration Progress to 30 minutes of continuous aerobic without signs/symptoms of physical distress      Intensity  THRR 40-80% of Max Heartrate 89-123    Ratings of Perceived Exertion 11-13    Perceived Dyspnea 0-4      Progression   Progression Continue to progress workloads to maintain intensity without signs/symptoms of physical distress.      Resistance Training   Training Prescription Yes    Weight 8 lb    Reps 10-15          Perform Capillary Blood Glucose checks as needed.  Exercise Prescription Changes:   Exercise Prescription Changes     Row Name 04/08/24 1000 04/24/24 1100 05/08/24 1400 05/22/24 0800 06/05/24 0900     Response to Exercise   Blood Pressure (Admit) 112/58 110/68 108/68 102/60 102/58   Blood Pressure (Exercise) 148/62 152/74 144/72 144/68 --   Blood Pressure (Exit) 122/58 102/58 110/64 118/54 102/60   Heart Rate (Admit) 55 bpm 63 bpm 56 bpm 61 bpm 52 bpm   Heart Rate (Exercise) 88 bpm 93 bpm 93 bpm 91 bpm 92 bpm   Heart Rate (Exit) 55 bpm 70 bpm 67 bpm 68 bpm 60 bpm   Oxygen Saturation (Admit) 99 % 98 % 98 % 97 % 98 %   Oxygen Saturation (Exercise) 97 % 97 % 96 % 97 % 96 %   Oxygen Saturation (Exit) 99 % 97 % 98 % 98 % 98 %   Rating of Perceived Exertion (Exercise) 12 13 15 13 12    Perceived Dyspnea (Exercise) 2 2 1 2 1    Symptoms none none none none none   Comments Results first 2 weeks of exercise first 2 weeks of exercise -- --   Duration -- Progress to 30 minutes of  aerobic without signs/symptoms of physical distress Progress to 30 minutes of  aerobic without signs/symptoms of physical distress Continue with 30 min of aerobic exercise without signs/symptoms of physical distress. Continue with 30 min of aerobic exercise without signs/symptoms of physical distress.   Intensity -- THRR unchanged THRR unchanged THRR unchanged THRR unchanged     Progression   Progression -- Continue to progress workloads to  maintain intensity without signs/symptoms of physical distress. Continue to progress workloads to maintain intensity without signs/symptoms of physical distress. Continue to progress workloads to maintain intensity without signs/symptoms of physical distress. Continue to progress workloads to maintain intensity without signs/symptoms of physical distress.   Average METs -- 3.13 3.39 3.32 3.49     Resistance Training   Training Prescription -- Yes Yes Yes Yes   Weight -- 8 lb 8 lb 8 lb 8 lb   Reps -- 10-15 10-15 10-15 10-15     Interval Training   Interval Training -- No No No No     Treadmill   MPH -- 2.8 2.6 2.6 2.8   Grade -- 0 0 0 0   Minutes -- 15 15 15 15    METs -- 3.14 2.99 2.99 3.14     NuStep   Level -- 3  T6 2  T6 5  T6 nustep 3  T6   Minutes -- 15 15 15 15    METs -- 2.1 2.5 2.6 3.1     REL-XR   Level -- 2 10 5 6    Minutes -- 15 15 15 15    METs -- 4.6 7.2 4.2 5.3     Oxygen   Maintain Oxygen Saturation -- 88% or higher 88% or higher 88% or higher 88% or higher    Row Name 06/05/24 1400 06/19/24 0700 07/04/24 0700 07/18/24 1600  07/29/24 1100     Response to Exercise   Blood Pressure (Admit) -- 104/66 108/64 122/62 102/64   Blood Pressure (Exit) -- 124/68 124/70 120/58 104/60   Heart Rate (Admit) -- 62 bpm 62 bpm 66 bpm 65 bpm   Heart Rate (Exercise) -- 97 bpm 88 bpm 98 bpm 94 bpm   Heart Rate (Exit) -- 71 bpm 69 bpm 70 bpm 72 bpm   Oxygen Saturation (Admit) -- 98 % 98 % 96 % 98 %   Oxygen Saturation (Exercise) -- 96 % 96 % 95 % 94 %   Oxygen Saturation (Exit) -- 97 % 97 % 97 % 97 %   Rating of Perceived Exertion (Exercise) -- 14 15 15 14    Perceived Dyspnea (Exercise) -- 1 1 3 1    Symptoms -- none none none none   Duration Continue with 30 min of aerobic exercise without signs/symptoms of physical distress. Continue with 30 min of aerobic exercise without signs/symptoms of physical distress. Continue with 30 min of aerobic exercise without signs/symptoms of  physical distress. Continue with 30 min of aerobic exercise without signs/symptoms of physical distress. Continue with 30 min of aerobic exercise without signs/symptoms of physical distress.   Intensity THRR unchanged THRR unchanged THRR unchanged THRR unchanged THRR unchanged     Progression   Progression Continue to progress workloads to maintain intensity without signs/symptoms of physical distress. Continue to progress workloads to maintain intensity without signs/symptoms of physical distress. Continue to progress workloads to maintain intensity without signs/symptoms of physical distress. Continue to progress workloads to maintain intensity without signs/symptoms of physical distress. Continue to progress workloads to maintain intensity without signs/symptoms of physical distress.   Average METs 3.49 3.6 2.72 3.37 3.35     Resistance Training   Training Prescription Yes Yes Yes Yes Yes   Weight 8 lb 8 lb 8 lb 8 lb 8 lb   Reps 10-15 10-15 10-15 10-15 10-15     Interval Training   Interval Training No No No No No     Treadmill   MPH 2.8 2.8 2.8 2.7 2.6   Grade 0 0 0 0 0   Minutes 15 15 15 15 15    METs 3.14 3.14 3.14 3.07 2.99     NuStep   Level 3  T6 3  T6 3  T6 3  T6 2  T6   Minutes 15 15 15 15 15    METs 3.1 3.7 3.6 4 3.7     REL-XR   Level 6 4 4 3 4    Minutes 15 15 15 15 15    METs 5.3 3.1 2.8 4.2 3.7     Home Exercise Plan   Plans to continue exercise at [employer] (comment)  [employer] [place], and walking [employer] (comment)  [employer] [place], and walking [employer] (comment)  [employer] [place], and walking [employer] (comment)  [employer] [place], and walking [employer] (comment)  [employer] [place], and [place]   Frequency Add 2 additional days to program exercise sessions. Add 2 additional days to program exercise sessions. Add 2 additional days to program exercise sessions. Add 2 additional days to  program exercise sessions. Add 2 additional days to program exercise sessions.   Initial Home Exercises Provided 06/05/24 06/05/24 06/05/24 06/05/24 06/05/24     Oxygen   Maintain Oxygen Saturation 88% or higher 88% or higher 88% or higher 88% or higher 88% or higher  Exercise Comments:   Exercise Comments     Row Name 04/10/24 1351           Exercise Comments First full day of exercise!  Patient was oriented to gym and equipment including functions, settings, policies, and procedures.  Patient's individual exercise prescription and treatment plan were reviewed.  All starting workloads were established based on the results of the 6 minute walk test done at initial orientation visit.  The plan for exercise progression was also introduced and progression will be customized based on patient's performance and goals.          Exercise Goals and Review:   Exercise Goals     Row Name 04/08/24 1030             Exercise Goals   Increase Physical Activity Yes       Intervention Provide advice, education, support and counseling about physical activity/exercise needs.;Develop an individualized exercise prescription for aerobic and resistive training based on initial evaluation findings, risk stratification, comorbidities and participant's personal goals.       Expected Outcomes Short Term: Attend rehab on a regular basis to increase amount of physical activity.;Long Term: Add in home exercise to make exercise part of routine and to increase amount of physical activity.;Long Term: Exercising regularly at least 3-5 days a week.       Increase Strength and Stamina Yes       Intervention Develop an individualized exercise prescription for aerobic and resistive training based on initial evaluation findings, risk stratification, comorbidities and participant's personal goals.;Provide advice, education, support and counseling about physical activity/exercise needs.       Expected Outcomes Short  Term: Increase workloads from initial exercise prescription for resistance, speed, and METs.;Short Term: Perform resistance training exercises routinely during rehab and add in resistance training at home;Long Term: Improve cardiorespiratory fitness, muscular endurance and strength as measured by increased METs and functional capacity ( )       Able to understand and use rate of perceived exertion (RPE) scale Yes       Intervention Provide education and explanation on how to use RPE scale       Expected Outcomes Short Term: Able to use RPE daily in rehab to express subjective intensity level;Long Term:  Able to use RPE to guide intensity level when exercising independently       Able to understand and use Dyspnea scale Yes       Intervention Provide education and explanation on how to use Dyspnea scale       Expected Outcomes Short Term: Able to use Dyspnea scale daily in rehab to express subjective sense of shortness of breath during exertion;Long Term: Able to use Dyspnea scale to guide intensity level when exercising independently       Knowledge and understanding of Target Heart Rate Range (THRR) Yes       Intervention Provide education and explanation of THRR including how the numbers were predicted and where they are located for reference       Expected Outcomes Short Term: Able to state/look up THRR;Long Term: Able to use THRR to govern intensity when exercising independently;Short Term: Able to use daily as guideline for intensity in rehab       Able to check pulse independently Yes       Intervention Provide education and demonstration on how to check pulse in carotid and radial arteries.;Review the importance of being able to check your own pulse for safety during independent exercise  Expected Outcomes Long Term: Able to check pulse independently and accurately;Short Term: Able to explain why pulse checking is important during independent exercise       Understanding of Exercise  Prescription Yes       Intervention Provide education, explanation, and written materials on patient's individual exercise prescription       Expected Outcomes Short Term: Able to explain program exercise prescription;Long Term: Able to explain home exercise prescription to exercise independently          Exercise Goals Re-Evaluation :  Exercise Goals Re-Evaluation     Row Name 04/10/24 1352 04/24/24 1118 05/08/24 1417 05/22/24 0850 06/05/24 0947     Exercise Goal Re-Evaluation   Exercise Goals Review Increase Physical Activity;Knowledge and understanding of Target Heart Rate Range (THRR);Able to understand and use rate of perceived exertion (RPE) scale;Understanding of Exercise Prescription;Increase Strength and Stamina;Able to check pulse independently Increase Physical Activity;Increase Strength and Stamina;Understanding of Exercise Prescription Increase Physical Activity;Increase Strength and Stamina;Understanding of Exercise Prescription Increase Physical Activity;Increase Strength and Stamina;Understanding of Exercise Prescription Increase Physical Activity;Increase Strength and Stamina;Understanding of Exercise Prescription   Comments Reviewed RPE and dyspnea scale, THR and program prescription with pt today.  Pt voiced understanding and was given a copy of goals to take home. Keiji is off to a good start in the program. He was able to attend his first 4 sessions during this review period. During his few sessions he was able to use the treadmill at a speed of 2. and no incline. He was also able to increase to level 3 on the T6 nustep. We will continue to monitor his progress in the program. Jadin continues to do well in the program. He was recently able to increase from level 2 to 10 on the XR. He was also able to use the Treadmill at a workload of 2. and no incline. We will continue to monitor his progress in the program. Jaycion is doing well in rehab. He continues to do well on the  treadmill at a speed of 2.6 mph with no incline. He also improved to level 5 on the T6 nustep and continues to use 8 lb handweights for resistance training. We will continue to monitor his progress in the program. Atreus continues to do well in rehab. He was recently able to increase his speed on the treadmill from 2.6 to 2.8 mph with no incline. He was also able to maintain level 3 on the T6 nustep. We will continue to monitor his progress in the program.   Expected Outcomes Short: Use RPE daily to regulate intensity.  Long: Follow program prescription in THR. Short: Continue to follow exercise prescription. Long: Continue exercise to improve strength and stamina. Short: Continue to follow exercise prescription. Long: Continue exercise to improve strength and stamina. Short: Continue to progressively increase treadmill workload. Long: Continue exercise to improve strength and stamina. Short: Continue to progressively increase treadmill workload. Long: Continue exercise to improve strength and stamina.    Row Name 06/05/24 1400 06/19/24 0754 07/03/24 1357 07/04/24 0801 07/18/24 1655     Exercise Goal Re-Evaluation   Exercise Goals Review Increase Physical Activity;Able to understand and use rate of perceived exertion (RPE) scale;Knowledge and understanding of Target Heart Rate Range (THRR);Understanding of Exercise Prescription;Increase Strength and Stamina;Able to understand and use Dyspnea scale;Able to check pulse independently Increase Physical Activity;Increase Strength and Stamina;Understanding of Exercise Prescription Increase Physical Activity;Increase Strength and Stamina;Understanding of Exercise Prescription Increase Physical Activity;Increase Strength and Stamina;Understanding of  Exercise Prescription Increase Physical Activity;Increase Strength and Stamina;Understanding of Exercise Prescription   Comments Reviewed home exercise with pt today from 1:50 pm to 2:00 pm.  Pt plans to go to the [place], and walk for exercise.  Reviewed THR, pulse, RPE, sign and symptoms, pulse oximetery and when to call 911 or MD.  Also discussed weather considerations and indoor options.  Pt voiced understanding. Talmadge is doing well in rehab. He continues to use the treadmill at a speed of 2. and no incline, and the T6 nustep at level 3. We will continue to encourage and monitor his progress in the program. Muaaz is doing well in the program, and has been doing home exercise at the [facility] on days not in the program. Harnoor continues to do well in rehab. He maintained level 4 on the XR and level 3 on the T6 nustep. He also maintained his workload on the treadmill at a speed of 2.8 mph with no incline. We will continue to monitor his progress in the program. Kingstyn continues to do well in rehab and is due for his post in the next review. He maintained level 3 on the T6 nustep. He worked at a speed of 2.7 mph on the treadmill with no incline. He also worked at level 3 on the XR. We will continue to monitor his progress in the program.   Expected Outcomes Short: add 1-2 days a week of exercise outside of pulmonary rehab. Long: maintain independent exercise routine upon graduation from rehab. Short: Add an incline of 1% to his treadmill workload. Long: Continue exercise to improve strength and stamina. Short: Continue to go to the [facility] on days not in the program. Long: Continue exercise to improve strength and stamina. Short: Try level 5 on the XR and level 4 on the T6 nustep. Long: Continue exercise to improve strength and stamina. Short: Improve on post . Long: Continue to increase overall METs and stamina.    Row Name 07/29/24 1131 07/31/24 1431           Exercise Goal Re-Evaluation   Exercise Goals Review Increase Physical Activity;Increase Strength and Stamina;Understanding of Exercise Prescription Increase Physical Activity;Able to understand and use rate of perceived exertion (RPE)  scale;Increase Strength and Stamina      Comments Zell is doing well in rehab. He recently completed his post-6MWT and was able to improve by 52ft. He was also able to increase from level 3 to 4 on the XR, as well as maintain his treadmill workload. We will continue to monitor his progress in the program. Kwaku continues to exercise on off days of rehab at the [facility]. He reports that he tolerates his exercise well on his own, and gets 5-6 days a week in of exercise total during the week. He plans to continue to exercise on his own at the [facility] once he finishes the program.      Expected Outcomes Short: Graduate. Long: Continue exercise to improve strength and stamina. Short: graduate from program. Long: maintain independent exercise routine upon graduation from program.         Discharge Exercise Prescription (Final Exercise Prescription Changes):  Exercise Prescription Changes - 07/29/24 1100       Response to Exercise   Blood Pressure (Admit) 102/64    Blood Pressure (Exit) 104/60    Heart Rate (Admit) 65 bpm    Heart Rate (Exercise) 94 bpm    Heart Rate (Exit) 72 bpm  Oxygen Saturation (Admit) 98 %    Oxygen Saturation (Exercise) 94 %    Oxygen Saturation (Exit) 97 %    Rating of Perceived Exertion (Exercise) 14    Perceived Dyspnea (Exercise) 1    Symptoms none    Duration Continue with 30 min of aerobic exercise without signs/symptoms of physical distress.    Intensity THRR unchanged      Progression   Progression Continue to progress workloads to maintain intensity without signs/symptoms of physical distress.    Average METs 3.35      Resistance Training   Training Prescription Yes    Weight 8 lb    Reps 10-15      Interval Training   Interval Training No      Treadmill   MPH 2.6    Grade 0    Minutes 15    METs 2.99      NuStep   Level 2   T6   Minutes 15    METs 3.7      REL-XR   Level 4    Minutes 15    METs 3.7      Home Exercise Plan   Plans to  continue exercise at [employer] (comment)   [facility], [place] exercise class, and walking   Frequency Add 2 additional days to program exercise sessions.    Initial Home Exercises Provided 06/05/24      Oxygen   Maintain Oxygen Saturation 88% or higher          Nutrition:  Target Goals: Understanding of nutrition guidelines, daily intake of sodium 1500mg , cholesterol 200mg , calories 30% from fat and 7% or less from saturated fats, daily to have 5 or more servings of fruits and vegetables.  Education: Nutrition 1 -Group instruction provided by verbal, written material, interactive activities, discussions, models, and posters to present general guidelines for heart healthy nutrition including macronutrients, label reading, and promoting whole foods over processed counterparts. Education serves as Pensions consultant of discussion of heart healthy eating for all. Written material provided at class time.     Education: Nutrition 2 -Group instruction provided by verbal, written material, interactive activities, discussions, models, and posters to present general guidelines for heart healthy nutrition including sodium, cholesterol, and saturated fat. Providing guidance of habit forming to improve blood pressure, cholesterol, and body weight. Written material provided at class time. Flowsheet Row Pulmonary Rehab from 07/17/2024 in [facility] Cardiac and Pulmonary Rehab  Date 07/10/24  Educator jg  Instruction Review Code 1- Verbalizes Understanding      Biometrics:  Pre Biometrics - 04/08/24 1031       Pre Biometrics   Height 6' 0.75 (1.848 m)    Weight 217 lb 3.2 oz (98.5 kg)    Waist Circumference 39.5 inches    Hip Circumference 43 inches    Waist to Hip Ratio 0.92 %    BMI (Calculated) 28.85    Single Leg Stand 2.9 seconds          Post Biometrics - 07/18/24 1359        Post  Biometrics   Height 6' 0.75 (1.848 m)    Weight 215 lb 1.6 oz (97.6 kg)    Waist Circumference 41 inches     Hip Circumference 42 inches    Waist to Hip Ratio 0.98 %    BMI (Calculated) 28.57    Single Leg Stand 4 seconds          Nutrition Therapy Plan and Nutrition Goals:  Nutrition Therapy & Goals - 04/08/24 1015       Nutrition Therapy   Diet Mediterranean    Protein (specify units) 70-90g    Fiber 30 grams    Whole Grain Foods 3 servings    Saturated Fats 15 max. grams    Fruits and Vegetables 5 servings/day    Sodium 2 grams      Personal Nutrition Goals   Nutrition Goal Eat 15-30gProtein and 30-60gCarbs at each meal.    Personal Goal #2 Eat 3 times per day, small frequent meals or nutrient dense snacks    Comments Patient drinking ~48oz of water  daily. He reports he usually eats 3 meals per day. Sometimes will miss lunch if he busy doing something. Encouraged him to have a snack if he thinks he might miss a meal. He agrees he should do that. Brainstormed several meal and snack ideas focusing on balancing carbs with protein or healthy fats. Provided mediterranean diet handout. Educated on the importance of meeting his nutritional goals with COPD.      Intervention Plan   Intervention Prescribe, educate and counsel regarding individualized specific dietary modifications aiming towards targeted core components such as weight, hypertension, lipid management, diabetes, heart failure and other comorbidities.;Nutrition handout(s) given to patient.    Expected Outcomes Long Term Goal: Adherence to prescribed nutrition plan.;Short Term Goal: A plan has been developed with personal nutrition goals set during dietitian appointment.;Short Term Goal: Understand basic principles of dietary content, such as calories, fat, sodium, cholesterol and nutrients.          Nutrition Assessments:  MEDIFICTS Score Key: >=70 Need to make dietary changes  40-70 Heart Healthy Diet <= 40 Therapeutic Level Cholesterol Diet  Flowsheet Row Pulmonary Rehab from 04/08/2024 in [facility] Cardiac and Pulmonary  Rehab  Picture Your Plate Total Score on Admission 75   Picture Your Plate Scores: <59 Unhealthy dietary pattern with much room for improvement. 41-50 Dietary pattern unlikely to meet recommendations for good health and room for improvement. 51-60 More healthful dietary pattern, with some room for improvement.  >60 Healthy dietary pattern, although there may be some specific behaviors that could be improved.   Nutrition Goals Re-Evaluation:  Nutrition Goals Re-Evaluation     Row Name 05/16/24 1425 05/30/24 1402 07/03/24 1425         Goals   Comment Patient was informed on why it is important to maintain a balanced diet when dealing with Respiratory issues. Explained that it takes a lot of energy to breath and when they are short of breath often they will need to have a good diet to help keep up with the calories they are expending for breathing. Mehmet is still trying to work on increasing his fiber intake, as well as his protein. Marquin reports he is doing well with nutrition. Watching his salt and sodium intake. He reports he likes veggies and lean proteins like chicken and [place]. Says his biggest weakness is sugar and sweets. Says a few times a week he gets a craving for ice cream or cookies. Spoke with him about ways to control the portion and endulge in a controlled way     Expected Outcome Short: Choose and plan snacks accordingly to patients caloric intake to improve breathing. Long: Maintain a diet independently that meets their caloric intake to aid in daily shortness of breath. Short: Continue to increase fiber and protein intake. Long: Continue to manage his diet in order to manage his CKD. STG: Read facts labels  and watch portions of sweets. LTG: Continue to manage his diet in order to manage his CKD.        Nutrition Goals Discharge (Final Nutrition Goals Re-Evaluation):  Nutrition Goals Re-Evaluation - 07/03/24 1425       Goals   Comment Dyson reports he is doing well  with nutrition. Watching his salt and sodium intake. He reports he likes veggies and lean proteins like chicken and [place]. Says his biggest weakness is sugar and sweets. Says a few times a week he gets a craving for ice cream or cookies. Spoke with him about ways to control the portion and endulge in a controlled way    Expected Outcome STG: Read facts labels and watch portions of sweets. LTG: Continue to manage his diet in order to manage his CKD.          Psychosocial: Target Goals: Acknowledge presence or absence of significant depression and/or stress, maximize coping skills, provide positive support system. Participant is able to verbalize types and ability to use techniques and skills needed for reducing stress and depression.   Education: Stress, Anxiety, and Depression - Group verbal and visual presentation to define topics covered.  Reviews how body is impacted by stress, anxiety, and depression.  Also discusses healthy ways to reduce stress and to treat/manage anxiety and depression.  Written material provided at class time.   Education: Sleep Hygiene -Provides group verbal and written instruction about how sleep can affect your health.  Define sleep hygiene, discuss sleep cycles and impact of sleep habits. Review good sleep hygiene tips.    Initial Review & Psychosocial Screening:  Initial Psych Review & Screening - 04/03/24 1021       Initial Review   Current issues with History of Depression      Family Dynamics   Good Support System? Yes   family, friends     Barriers   Psychosocial barriers to participate in program There are no identifiable barriers or psychosocial needs.;The patient should benefit from training in stress management and relaxation.      Screening Interventions   Interventions Encouraged to exercise    Expected Outcomes Short Term goal: Utilizing psychosocial counselor, staff and physician to assist with identification of specific Stressors or current  issues interfering with healing process. Setting desired goal for each stressor or current issue identified.;Long Term Goal: Stressors or current issues are controlled or eliminated.;Short Term goal: Identification and review with participant of any Quality of Life or Depression concerns found by scoring the questionnaire.;Long Term goal: The participant improves quality of Life and PHQ9 Scores as seen by post scores and/or verbalization of changes          Quality of Life Scores:  Scores of 19 and below usually indicate a poorer quality of life in these areas.  A difference of  2-3 points is a clinically meaningful difference.  A difference of 2-3 points in the total score of the Quality of Life Index has been associated with significant improvement in overall quality of life, self-image, physical symptoms, and general health in studies assessing change in quality of life.  PHQ-9: Review Flowsheet       05/16/2024 04/08/2024  Depression screen PHQ 2/9  Decreased Interest 0 1  Down, Depressed, Hopeless 1 2  PHQ - 2 Score 1 3  Altered sleeping 1 0  Tired, decreased energy 1 1  Change in appetite 1 2  Feeling bad or failure about yourself  0 0  Trouble concentrating  1 1  Moving slowly or fidgety/restless 0 1  Suicidal thoughts 0 0  PHQ-9 Score 5 8  Difficult doing work/chores Somewhat difficult Somewhat difficult   Interpretation of Total Score  Total Score Depression Severity:  1-4 = Minimal depression, 5-9 = Mild depression, 10-14 = Moderate depression, 15-19 = Moderately severe depression, 20-27 = Severe depression   Psychosocial Evaluation and Intervention:  Psychosocial Evaluation - 04/03/24 1024       Psychosocial Evaluation & Interventions   Interventions Encouraged to exercise with the program and follow exercise prescription    Comments Mr. Springer is coming to Pulmonary Rehab with COPD. He states he has no concerns currently with stress or anxiety/depression, but does have  a history of PTSD. He has a great support system and feels like he is managing his health well. He is interested in learning more about risk factor management and increasing his stamina while in the program.    Expected Outcomes Short: attend pulmonary rehab for exercise and education. Long; develop and maintain positive self care habits    Continue Psychosocial Services  Follow up required by staff          Psychosocial Re-Evaluation:  Psychosocial Re-Evaluation     Row Name 05/16/24 1426 05/30/24 1359 07/03/24 1346         Psychosocial Re-Evaluation   Current issues with None Identified None Identified None Identified     Comments Reviewed patient health questionnaire (PHQ-9) with patient for follow up. Previously, patients score indicated signs/symptoms of depression.  Reviewed to see if patient is improving symptom wise while in program.  Score improved and patient states that it is because he has been able to exerise more. Maximo is doing well in the program. He states that he has no stressors at this time. He has a good support system made up of his daughter and wife. Exercise is a good outlet for [place], and has helped with him deal with his stress if he has any. Jaymian is not dealing with any stress at this time, other than switching his car insurance. He states that he is sleeping well. He can rely on his wife and daughter for support if any stress arises. Exercise continues to be a stress reliever, and he continues exercise at the [facility] on days he doesnt attend the program.     Expected Outcomes Short: Continue to attend [facility] regularly for regular exercise and social engagement. Long: Continue to improve symptoms and manage a positive mental state. Short: Continue to attend [facility] regularly for regular exercise and social engagement. Long: Continue to improve symptoms and manage a positive mental state. Short: Continue to attend [facility] regularly for regular exercise and  social engagement. Long: Continue to improve symptoms and manage a positive mental state.     Interventions Encouraged to attend Pulmonary Rehabilitation for the exercise Encouraged to attend Pulmonary Rehabilitation for the exercise Encouraged to attend Pulmonary Rehabilitation for the exercise     Continue Psychosocial Services  Follow up required by staff Follow up required by staff Follow up required by staff        Psychosocial Discharge (Final Psychosocial Re-Evaluation):  Psychosocial Re-Evaluation - 07/03/24 1346       Psychosocial Re-Evaluation   Current issues with None Identified    Comments Zaylon is not dealing with any stress at this time, other than switching his car insurance. He states that he is sleeping well. He can rely on his wife and daughter for support if any  stress arises. Exercise continues to be a stress reliever, and he continues exercise at the [facility] on days he doesnt attend the program.    Expected Outcomes Short: Continue to attend [facility] regularly for regular exercise and social engagement. Long: Continue to improve symptoms and manage a positive mental state.    Interventions Encouraged to attend Pulmonary Rehabilitation for the exercise    Continue Psychosocial Services  Follow up required by staff          Education: Education Goals: Education classes will be provided on a weekly basis, covering required topics. Participant will state understanding/return demonstration of topics presented.  Learning Barriers/Preferences:  Learning Barriers/Preferences - 04/03/24 1020       Learning Barriers/Preferences   Learning Barriers None    Learning Preferences None          General Pulmonary Education Topics:  Infection Prevention: - Provides verbal and written material to individual with discussion of infection control including proper hand washing and proper equipment cleaning during exercise session. Flowsheet Row Pulmonary Rehab from 07/17/2024  in [facility] Cardiac and Pulmonary Rehab  Date 04/08/24  Educator NT  Instruction Review Code 1- Verbalizes Understanding    Falls Prevention: - Provides verbal and written material to individual with discussion of falls prevention and safety. Flowsheet Row Pulmonary Rehab from 07/17/2024 in [facility] Cardiac and Pulmonary Rehab  Date 04/08/24  Educator NT  Instruction Review Code 1- Verbalizes Understanding    Chronic Lung Disease Review: - Group verbal instruction with posters, models, PowerPoint presentations and videos,  to review new updates, new respiratory medications, new advancements in procedures and treatments. Providing information on websites and 800 numbers for continued self-education. Includes information about supplement oxygen, available portable oxygen systems, continuous and intermittent flow rates, oxygen safety, concentrators, and Medicare reimbursement for oxygen. Explanation of Pulmonary Drugs, including class, frequency, complications, importance of spacers, rinsing mouth after steroid MDI's, and proper cleaning methods for nebulizers. Review of basic lung anatomy and physiology related to function, structure, and complications of lung disease. Review of risk factors. Discussion about methods for diagnosing sleep apnea and types of masks and machines for OSA. Includes a review of the use of types of environmental controls: home humidity, furnaces, filters, dust mite/pet prevention, HEPA vacuums. Discussion about weather changes, air quality and the benefits of nasal washing. Instruction on Warning signs, infection symptoms, calling MD promptly, preventive modes, and value of vaccinations. Review of effective airway clearance, coughing and/or vibration techniques. Emphasizing that all should Create an Action Plan. Written material provided at class time. Flowsheet Row Pulmonary Rehab from 07/17/2024 in [facility] Cardiac and Pulmonary Rehab  Education need identified 04/08/24     AED/CPR: - Group verbal and written instruction with the use of models to demonstrate the basic use of the AED with the basic ABC's of resuscitation.    Tests and Procedures:  - Group verbal and visual presentation and models provide information about basic cardiac anatomy and function. Reviews the testing methods done to diagnose heart disease and the outcomes of the test results. Describes the treatment choices: Medical Management, Angioplasty, or Coronary Bypass Surgery for treating various heart conditions including Myocardial Infarction, Angina, Valve Disease, and Cardiac Arrhythmias.  Written material provided at class time. Flowsheet Row Pulmonary Rehab from 07/17/2024 in [facility] Cardiac and Pulmonary Rehab  Date 07/17/24  Educator KB  Instruction Review Code 1- Verbalizes Understanding    Medication Safety: - Group verbal and visual instruction to review commonly prescribed medications for heart and lung  disease. Reviews the medication, class of the drug, and side effects. Includes the steps to properly store meds and maintain the prescription regimen.  Written material given at graduation.   Other: -Provides group and verbal instruction on various topics (see comments)   Knowledge Questionnaire Score:  Knowledge Questionnaire Score - 04/08/24 1020       Knowledge Questionnaire Score   Pre Score 15/18           Core Components/Risk Factors/Patient Goals at Admission:  Personal Goals and Risk Factors at Admission - 04/03/24 1019       Core Components/Risk Factors/Patient Goals on Admission    Weight Management Yes;Weight Maintenance    Intervention Weight Management: Develop a combined nutrition and exercise program designed to reach desired caloric intake, while maintaining appropriate intake of nutrient and fiber, sodium and fats, and appropriate energy expenditure required for the weight goal.;Weight Management: Provide education and appropriate resources to help  participant work on and attain dietary goals.;Weight Management/Obesity: Establish reasonable short term and long term weight goals.    Expected Outcomes Weight Maintenance: Understanding of the daily nutrition guidelines, which includes 25-35% calories from fat, 7% or less cal from saturated fats, less than 200mg  cholesterol, less than 1.5gm of sodium, & 5 or more servings of fruits and vegetables daily;Long Term: Adherence to nutrition and physical activity/exercise program aimed toward attainment of established weight goal;Short Term: Continue to assess and modify interventions until short term weight is achieved    Improve shortness of breath with ADL's Yes    Intervention Provide education, individualized exercise plan and daily activity instruction to help decrease symptoms of SOB with activities of daily living.    Expected Outcomes Short Term: Improve cardiorespiratory fitness to achieve a reduction of symptoms when performing ADLs;Long Term: Be able to perform more ADLs without symptoms or delay the onset of symptoms    Hypertension Yes    Intervention Provide education on lifestyle modifcations including regular physical activity/exercise, weight management, moderate sodium restriction and increased consumption of fresh fruit, vegetables, and low fat dairy, alcohol  moderation, and smoking cessation.;Monitor prescription use compliance.    Expected Outcomes Short Term: Continued assessment and intervention until BP is < 140/59mm HG in hypertensive participants. < 130/89mm HG in hypertensive participants with diabetes, heart failure or chronic kidney disease.;Long Term: Maintenance of blood pressure at goal levels.    Lipids Yes    Intervention Provide education and support for participant on nutrition & aerobic/resistive exercise along with prescribed medications to achieve LDL 70mg , HDL >40mg .    Expected Outcomes Short Term: Participant states understanding of desired cholesterol values and is  compliant with medications prescribed. Participant is following exercise prescription and nutrition guidelines.;Long Term: Cholesterol controlled with medications as prescribed, with individualized exercise RX and with personalized nutrition plan. Value goals: LDL < 70mg , HDL > 40 mg.          Education:Diabetes - Individual verbal and written instruction to review signs/symptoms of diabetes, desired ranges of glucose level fasting, after meals and with exercise. Acknowledge that pre and post exercise glucose checks will be done for 3 sessions at entry of program.   Know Your Numbers and Heart Failure: - Group verbal and visual instruction to discuss disease risk factors for cardiac and pulmonary disease and treatment options.  Reviews associated critical values for Overweight/Obesity, Hypertension, Cholesterol, and Diabetes.  Discusses basics of heart failure: signs/symptoms and treatments.  Introduces Heart Failure Zone chart for action plan for heart failure. Written material provided  at class time. Flowsheet Row Pulmonary Rehab from 07/17/2024 in [facility] Cardiac and Pulmonary Rehab  Date 05/22/24  Educator SB  Instruction Review Code 1- Verbalizes Understanding    Core Components/Risk Factors/Patient Goals Review:   Goals and Risk Factor Review     Row Name 05/16/24 1424 05/30/24 1405 07/03/24 1351         Core Components/Risk Factors/Patient Goals Review   Personal Goals Review Weight Management/Obesity Weight Management/Obesity;Improve shortness of breath with ADL's Weight Management/Obesity;Improve shortness of breath with ADL's     Review Spoke to patient about their shortness of breath and what they can do to improve. Patient has been informed of breathing techniques when starting the program. Patient is informed to tell staff if they have had any med changes and that certain meds they are taking or not taking can be causing shortness of breath. Jencarlo continues to work to maintain  his weight. He states that he is trying to keep his belly fat under control. He Continues to see improvements with his shortness of breath. Jermayne is at a comfortable weight. He states that he has lost about 20lbs since last year and is proud of himself for that. He continues to [place] his shortness of breath. Advised patient to a pulse ox in order to check his oxygen sats at [facility].     Expected Outcomes Short: Attend [facility] regularly to improve shortness of breath with ADL's. Long: maintain independence with ADL's Short: Attend [facility] regularly to improve shortness of breath with ADL's. Long: maintain independence with ADL's Short: Attend [facility] regularly to improve shortness of breath with ADL's. Long: maintain independence with ADL's        Core Components/Risk Factors/Patient Goals at Discharge (Final Review):   Goals and Risk Factor Review - 07/03/24 1351       Core Components/Risk Factors/Patient Goals Review   Personal Goals Review Weight Management/Obesity;Improve shortness of breath with ADL's    Review Frederich is at a comfortable weight. He states that he has lost about 20lbs since last year and is proud of himself for that. He continues to [place] his shortness of breath. Advised patient to a pulse ox in order to check his oxygen sats at [facility].    Expected Outcomes Short: Attend [facility] regularly to improve shortness of breath with ADL's. Long: maintain independence with ADL's          ITP Comments:  ITP Comments     Row Name 04/03/24 1026 04/08/24 1028 04/10/24 1351 04/24/24 1114 05/22/24 1132   ITP Comments Initial phone call completed. Diagnosis can be found in [facility] 5/7. EP Orientation scheduled for Monday 5/19 at 9am. Completed and gym orientation for pulmonary rehab. Initial ITP created and sent for review to Dr. Faud Aleskerov, Medical Director. First full day of exercise!  Patient was oriented to gym and equipment including functions, settings,  policies, and procedures.  Patient's individual exercise prescription and treatment plan were reviewed.  All starting workloads were established based on the results of the 6 minute walk test done at initial orientation visit.  The plan for exercise progression was also introduced and progression will be customized based on patient's performance and goals. 30 Day review completed. Medical Director ITP review done, changes made as directed, and signed approval by Medical Director.    new to program 30 Day review completed. Medical Director ITP review done, changes made as directed, and signed approval by Medical Director.    Row Name 06/19/24 1019 07/17/24 1055  08/01/24 1419       ITP Comments 30 Day review completed. Medical Director ITP review done, changes made as directed, and signed approval by Medical Director. 30 Day review completed. Medical Director ITP review done, changes made as directed, and signed approval by Medical Director. Winford graduated today from  rehab with 35 sessions completed.  Details of the patient's exercise prescription and what He needs to do in order to continue the prescription and progress were discussed with patient.  Patient was given a copy of prescription and goals.  Patient verbalized understanding. Brayden plans to continue to exercise by walking and attending the [facility] and [place] exercise classes.        Comments: Discharge ITP

## 2024-08-01 NOTE — Progress Notes (Signed)
 Daily Session Note  Patient Details  Name: Wesley Weber MRN: [id number] Date of Birth: December 23, 1945 Referring Provider:   [name] Pulmonary Rehab from 04/08/2024 in [facility] Cardiac and Pulmonary Rehab  Referring Provider Dr. Jereld Boos, MD    Encounter Date: 08/01/2024  Check In:  Session Check In - 08/01/24 1416       Check-In   Supervising physician immediately available to respond to emergencies See telemetry face sheet for immediately available ER MD    Location [facility]-Cardiac & Pulmonary Rehab    Staff Present Rollene Paterson, MS, Exercise Physiologist;Noah Tickle, BS, Exercise Physiologist;Joseph Rolinda RCP,RRT,BSRT;Marvel Sapp RN,BSN    Virtual Visit No    Medication changes reported     No    Fall or balance concerns reported    No    Warm-up and Cool-down Performed on first and last piece of equipment    Resistance Training Performed Yes    VAD Patient? No    PAD/SET Patient? No      Pain Assessment   Currently in Pain? No/denies             Social History   Tobacco Use  Smoking Status Former   Current packs/day: 0.00   Types: Cigarettes   Start date: 09/06/1976   Quit date: 09/06/1986   Years since quitting: 37.9  Smokeless Tobacco Never    Goals Met:  Proper associated with RPD/PD & O2 Sat Independence with exercise equipment Using PLB without cueing & demonstrates good technique Exercise tolerated well No report of concerns or symptoms today Strength training completed today  Goals Unmet:  Not Applicable  Comments:  Wesley Weber graduated today from  rehab with 35 sessions completed.  Details of the patient's exercise prescription and what He needs to do in order to continue the prescription and progress were discussed with patient.  Patient was given a copy of prescription and goals.  Patient verbalized understanding. Wesley Weber plans to continue to exercise by walking and attending the [facility] and [place] exercise classes.    Dr. Oneil Pinal is Medical  Director for [facility] Cardiac Rehabilitation.  Dr. Fuad Aleskerov is Medical Director for [facility] Pulmonary Rehabilitation.

## 2024-08-07 ENCOUNTER — Encounter

## 2024-08-08 ENCOUNTER — Encounter

## 2024-08-19 ENCOUNTER — Other Ambulatory Visit: Payer: Self-pay

## 2024-08-19 ENCOUNTER — Emergency Department

## 2024-08-19 ENCOUNTER — Emergency Department
Admission: EM | Admit: 2024-08-19 | Discharge: 2024-08-19 | Disposition: A | Discharge status: Home / Self Care | Attending: Emergency Medicine | Admitting: Emergency Medicine

## 2024-08-19 DIAGNOSIS — N5082 Scrotal pain: Secondary | ICD-10-CM | POA: Diagnosis present

## 2024-08-19 DIAGNOSIS — I10 Essential (primary) hypertension: Secondary | ICD-10-CM | POA: Diagnosis not present

## 2024-08-19 LAB — URINALYSIS, ROUTINE W REFLEX MICROSCOPIC
Bilirubin Urine: NEGATIVE
Glucose, UA: NEGATIVE mg/dL
Hgb urine dipstick: NEGATIVE
Ketones, ur: NEGATIVE mg/dL
Leukocytes,Ua: NEGATIVE
Nitrite: NEGATIVE
Protein, ur: NEGATIVE mg/dL
Specific Gravity, Urine: 1.01 (ref 1.005–1.030)
pH: 6 (ref 5.0–8.0)
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
## 2024-09-25 ENCOUNTER — Ambulatory Visit (INDEPENDENT_AMBULATORY_CARE_PROVIDER_SITE_OTHER): Admitting: Urology

## 2024-09-25 ENCOUNTER — Encounter: Payer: Self-pay | Admitting: Urology

## 2024-09-25 VITALS — BP 130/77 | HR 68 | Ht 73.0 in | Wt 215.0 lb

## 2024-09-25 DIAGNOSIS — I861 Scrotal varices: Secondary | ICD-10-CM | POA: Diagnosis not present

## 2024-09-25 DIAGNOSIS — N5082 Scrotal pain: Secondary | ICD-10-CM

## 2024-09-25 MED ORDER — ETODOLAC 200 MG PO CAPS: 200.0000 mg | ORAL_CAPSULE | Freq: Every day | ORAL | 0 refills | Status: AC

## 2024-09-25 NOTE — Patient Instructions (Signed)
 Scheduling number: [phone]
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
## 2024-09-26 ENCOUNTER — Emergency Department

## 2024-09-26 ENCOUNTER — Other Ambulatory Visit: Payer: Self-pay

## 2024-09-26 ENCOUNTER — Emergency Department
Admission: EM | Admit: 2024-09-26 | Discharge: 2024-09-26 | Disposition: A | Discharge status: Home / Self Care | Attending: Emergency Medicine | Admitting: Emergency Medicine

## 2024-09-26 DIAGNOSIS — R1030 Lower abdominal pain, unspecified: Secondary | ICD-10-CM | POA: Insufficient documentation

## 2024-09-26 DIAGNOSIS — N5082 Scrotal pain: Secondary | ICD-10-CM | POA: Insufficient documentation

## 2024-09-26 LAB — URINALYSIS, ROUTINE W REFLEX MICROSCOPIC
Bacteria, UA: NONE SEEN
Bilirubin Urine: NEGATIVE
Glucose, UA: NEGATIVE mg/dL
Hgb urine dipstick: NEGATIVE
Ketones, ur: NEGATIVE mg/dL
Nitrite: NEGATIVE
Protein, ur: NEGATIVE mg/dL
Specific Gravity, Urine: 1.004 — ABNORMAL LOW (ref 1.005–1.030)
pH: 6 (ref 5.0–8.0)

## 2024-09-26 NOTE — Discharge Instructions (Signed)
 Ultrasound is reassuring your vitals are reassuring you should follow-up outpatient with urology for the planned CT scan and further urology evaluation of this.  If you develop a change in symptoms such as fevers, pain in your abdomen then please return to the ER for repeat evaluation.

## 2024-09-26 NOTE — ED Notes (Signed)
 Patient declined discharge vital signs.
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
## 2024-09-27 LAB — URINE CULTURE: Culture: NO GROWTH

## 2024-10-01 ENCOUNTER — Ambulatory Visit
Admission: RE | Admit: 2024-10-01 | Discharge: 2024-10-01 | Disposition: A | Discharge status: Home / Self Care | Source: Ambulatory Visit | Attending: Urology | Admitting: Urology

## 2024-10-01 DIAGNOSIS — N5082 Scrotal pain: Secondary | ICD-10-CM | POA: Insufficient documentation

## 2024-10-01 DIAGNOSIS — N4283 Cyst of prostate: Secondary | ICD-10-CM | POA: Diagnosis not present

## 2024-10-01 DIAGNOSIS — N138 Other obstructive and reflux uropathy: Secondary | ICD-10-CM | POA: Diagnosis not present

## 2024-10-01 DIAGNOSIS — N401 Enlarged prostate with lower urinary tract symptoms: Secondary | ICD-10-CM | POA: Insufficient documentation

## 2024-10-04 ENCOUNTER — Ambulatory Visit: Payer: Self-pay | Admitting: Urology

## 2024-10-30 ENCOUNTER — Emergency Department
Admission: EM | Admit: 2024-10-30 | Discharge: 2024-10-30 | Disposition: A | Discharge status: Home / Self Care | Attending: Emergency Medicine | Admitting: Emergency Medicine

## 2024-10-30 DIAGNOSIS — D72819 Decreased white blood cell count, unspecified: Secondary | ICD-10-CM | POA: Insufficient documentation

## 2024-10-30 DIAGNOSIS — I10 Essential (primary) hypertension: Secondary | ICD-10-CM | POA: Insufficient documentation

## 2024-10-30 DIAGNOSIS — R42 Dizziness and giddiness: Secondary | ICD-10-CM | POA: Insufficient documentation

## 2024-10-30 LAB — COMPREHENSIVE METABOLIC PANEL WITH GFR
ALT: 23 U/L (ref 0–44)
AST: 22 U/L (ref 15–41)
Albumin: 4.1 g/dL (ref 3.5–5.0)
Alkaline Phosphatase: 59 U/L (ref 38–126)
Anion gap: 10 (ref 5–15)
BUN: 13 mg/dL (ref 8–23)
CO2: 27 mmol/L (ref 22–32)
Calcium: 9.9 mg/dL (ref 8.9–10.3)
Chloride: 103 mmol/L (ref 98–111)
Creatinine, Ser: 1.36 mg/dL — ABNORMAL HIGH (ref 0.61–1.24)
GFR, Estimated: 53 mL/min — ABNORMAL LOW (ref >60)
Glucose, Bld: 90 mg/dL (ref 70–99)
Potassium: 4.1 mmol/L (ref 3.5–5.1)
Sodium: 140 mmol/L (ref 135–145)
Total Bilirubin: 0.7 mg/dL (ref 0.0–1.2)
Total Protein: 7.1 g/dL (ref 6.5–8.1)

## 2024-10-30 LAB — CBC
HCT: 40.1 % (ref 39.0–52.0)
Hemoglobin: 13.4 g/dL (ref 13.0–17.0)
MCH: 30 pg (ref 26.0–34.0)
MCHC: 33.4 g/dL (ref 30.0–36.0)
MCV: 89.7 fL (ref 80.0–100.0)
Platelets: 177 K/uL (ref 150–400)
RBC: 4.47 MIL/uL (ref 4.22–5.81)
RDW: 13.3 % (ref 11.5–15.5)
WBC: 3.9 K/uL — ABNORMAL LOW (ref 4.0–10.5)
nRBC: 0 % (ref 0.0–0.2)

## 2024-10-30 NOTE — ED Triage Notes (Signed)
 Pt presents to the ED via POV from home with dizziness/lightheadedness. Pt reports that he has had hypotension. Reports chronic anemia from low iron. Pt reports having a blood transfusion 10-15 years ago. BP 119/77 at time of triage

## 2024-10-30 NOTE — ED Provider Notes (Signed)
 [facility] Emergency Department Provider Note     Event Date/Time   First MD Initiated Contact with Patient 10/30/24 1616     (approximate)   History   Dizziness   HPI  Wesley Weber is a 78 y.o. male with a history of HTN, HLD, OSA on CPAP, GERD, iron deficiency anemia, and bradycardia, who presents to the ED from home.  Patient presents via POV, endorsing some intermittent dizziness and lightheadedness.  He notes he has had some hypotension, but denies any recent complaints.  He reports a 10 to 15-year history of a remote blood transfusion.  No recent need for red cell transfusion.  Patient denies any cough, congestion, or fever.  No chest pain, shortness of breath.  No reports of any headache, vision change, paralysis.  No reports of any vertigo, tinnitus, hearing loss, hearing change, facial droop, or weakness.   Physical Exam   Triage Vital Signs: ED Triage Vitals  Encounter Vitals Group     BP 10/30/24 1548 119/77     Girls Systolic BP Percentile --      Girls Diastolic BP Percentile --      Boys Systolic BP Percentile --      Boys Diastolic BP Percentile --      Pulse Rate 10/30/24 1548 70     Resp 10/30/24 1548 18     Temp 10/30/24 1548 97.8 F (36.6 C)     Temp Source 10/30/24 1548 Oral     SpO2 10/30/24 1548 100 %     Weight 10/30/24 1549 218 lb (98.9 kg)     Height 10/30/24 1549 6' 1 (1.854 m)     Head Circumference --      Peak Flow --      Pain Score 10/30/24 1549 6     Pain Loc --      Pain Education --      Exclude from Growth Chart --     Most recent vital signs: Vitals:   10/30/24 1548  BP: 119/77  Pulse: 70  Resp: 18  Temp: 97.8 F (36.6 C)  SpO2: 100%    General Awake, no distress. NAD HEENT NCAT. PERRL. EOMI. No rhinorrhea. Mucous membranes are moist.  CV:  Good peripheral perfusion. RRR RESP:  Normal effort. CTA ABD:  No distention.    ED Results / Procedures / Treatments   Labs (all labs ordered  are listed, but only abnormal results are displayed) Labs Reviewed  COMPREHENSIVE METABOLIC PANEL WITH GFR - Abnormal; Notable for the following components:      Result Value   Creatinine, Ser 1.36 (*)    GFR, Estimated 53 (*)    All other components within normal limits  CBC - Abnormal; Notable for the following components:   WBC 3.9 (*)    All other components within normal limits    EKG  Vent. rate 62 BPM PR interval 162 ms  QRS duration 82 ms  QT/QTcB 370/375 ms  P-R-T axes 63 -28 24  Normal sinus rhythm  Minimal voltage criteria for LVH, may be normal variant ( R in aVL ) Borderline ECG When compared with ECG of 16-Sep-2023 07:54, Premature ventricular complex  RADIOLOGY  No results found.   PROCEDURES:  Critical Care performed: No  Procedures   MEDICATIONS ORDERED IN ED: Medications - No data to display   IMPRESSION / MDM / ASSESSMENT AND PLAN / ED COURSE  I reviewed the triage vital signs and  the nursing notes.                              Differential diagnosis includes, but is not limited to, hypotension, electrolyte abnormality, anemia, dehydration, arrhythmia  Patient's presentation is most consistent with acute complicated illness / injury requiring diagnostic workup.  Patient's diagnosis is consistent with lightheadedness of an unclear etiology.  Patient presents in no acute distress, endorsing some positional changes inducing lightheadedness.  Denies any frank vertigo, weakness, or syncope.  Patient's exam is overall reassuring.  No evidence of hypotension, tachycardia, or hypoxia.  Labs are reassuring with no acute leukocytosis or critical anemia.  No signs of acute dehydration or electrolyte abnormality.  No EKG evidence of malignant arrhythmia.  Patient will be discharged home with instructions to consider holding his nighttime blood pressure medicine.  Patient to continue to monitor and document his blood pressure readings.  Patient is to follow up  with his primary provider as needed or otherwise directed. Patient is given ED precautions to return to the ED for any worsening or new symptoms.   FINAL CLINICAL IMPRESSION(S) / ED DIAGNOSES   Final diagnoses:  Lightheadedness     Rx / DC Orders   ED Discharge Orders     None        Note:  This document was prepared using Dragon voice recognition software and may include unintentional dictation errors.    Loyd Candida LULLA Aldona, PA-C 10/30/24 1841    Bradler, Evan K, MD 10/30/24 2158

## 2024-10-30 NOTE — Discharge Instructions (Signed)
 Your exam, labs, and EKG are normal and reassuring at this time.  Your vital signs also within normal limits.  Your symptoms may be due to some lower blood pressure readings causing you to feel dizzy when changing positions.  Consider holding your nighttime amlodipine  and reevaluating your blood pressure for the next few days.  Follow-up with your primary provider for ongoing evaluation.

## 2024-11-16 ENCOUNTER — Other Ambulatory Visit: Payer: Self-pay

## 2024-11-16 ENCOUNTER — Emergency Department
Admission: EM | Admit: 2024-11-16 | Discharge: 2024-11-16 | Disposition: A | Discharge status: Home / Self Care | Payer: Self-pay | Attending: Emergency Medicine | Admitting: Emergency Medicine

## 2024-11-16 DIAGNOSIS — N189 Chronic kidney disease, unspecified: Secondary | ICD-10-CM | POA: Insufficient documentation

## 2024-11-16 DIAGNOSIS — I129 Hypertensive chronic kidney disease with stage 1 through stage 4 chronic kidney disease, or unspecified chronic kidney disease: Secondary | ICD-10-CM | POA: Diagnosis not present

## 2024-11-16 DIAGNOSIS — N342 Other urethritis: Secondary | ICD-10-CM | POA: Insufficient documentation

## 2024-11-16 DIAGNOSIS — L2989 Other pruritus: Secondary | ICD-10-CM | POA: Diagnosis present

## 2024-11-16 LAB — URINALYSIS, ROUTINE W REFLEX MICROSCOPIC
Bacteria, UA: NONE SEEN
Bilirubin Urine: NEGATIVE
Glucose, UA: NEGATIVE mg/dL
Hgb urine dipstick: NEGATIVE
Ketones, ur: NEGATIVE mg/dL
Nitrite: NEGATIVE
Protein, ur: NEGATIVE mg/dL
Specific Gravity, Urine: 1.011 (ref 1.005–1.030)
pH: 6 (ref 5.0–8.0)

## 2024-11-16 MED ORDER — CEPHALEXIN 500 MG PO CAPS: 500.0000 mg | ORAL_CAPSULE | Freq: Three times a day (TID) | ORAL | 0 refills | Status: AC

## 2024-11-16 MED ORDER — CLOTRIMAZOLE 1 % EX CREA: 1.0000 | TOPICAL_CREAM | Freq: Two times a day (BID) | CUTANEOUS | 0 refills | Status: AC

## 2024-11-16 NOTE — ED Triage Notes (Signed)
 Pt to ED via POV from home. Pt reports itching with urination x2 days. Pt denies difficulty urinating/pain, back pain or abd pain.

## 2024-11-16 NOTE — ED Provider Notes (Signed)
 "  [facility] Provider Note    Event Date/Time   First MD Initiated Contact with Patient 11/16/24 [phone]     (approximate)   History   Penile itching   HPI  Wesley Weber is a 78 y.o. male with history of CKD, hypertension presents with complaints of itching to the posterior aspect of the head of the penis.  This has been ongoing for couple days, no dysuria.  No abdominal pain.  No other complaints.  No discharge.     Physical Exam   Triage Vital Signs: ED Triage Vitals  Encounter Vitals Group     BP 11/16/24 0847 (!) 157/70     Girls Systolic BP Percentile --      Girls Diastolic BP Percentile --      Boys Systolic BP Percentile --      Boys Diastolic BP Percentile --      Pulse Rate 11/16/24 0846 65     Resp 11/16/24 0846 18     Temp 11/16/24 0846 98 F (36.7 C)     Temp Source 11/16/24 0846 Oral     SpO2 11/16/24 0846 98 %     Weight --      Height --      Head Circumference --      Peak Flow --      Pain Score 11/16/24 0846 6     Pain Loc --      Pain Education --      Exclude from Growth Chart --     Most recent vital signs: Vitals:   11/16/24 0846 11/16/24 0847  BP:  (!) 157/70  Pulse: 65   Resp: 18   Temp: 98 F (36.7 C)   SpO2: 98%      General: Awake, no distress.  CV:  Good peripheral perfusion.  Resp:  Normal effort.  Abd:  No distention.  Soft, nontender, no CVA tenderness Other:  Penis: Mild erythema posterior head of the penis suspicious for fungal infection, no discharge, scrotum normal.   ED Results / Procedures / Treatments   Labs (all labs ordered are listed, but only abnormal results are displayed) Labs Reviewed  URINALYSIS, ROUTINE W REFLEX MICROSCOPIC - Abnormal; Notable for the following components:      Result Value   Color, Urine YELLOW (*)    APPearance CLEAR (*)    Leukocytes,Ua SMALL (*)    All other components within normal limits      EKG     RADIOLOGY     PROCEDURES:  Critical Care performed:   Procedures   MEDICATIONS ORDERED IN ED: Medications - No data to display   IMPRESSION / MDM / ASSESSMENT AND PLAN / ED COURSE  I reviewed the triage vital signs and the nursing notes. Patient's presentation is most consistent with acute complicated illness / injury requiring diagnostic workup.  Patient presents with symptoms as above, differential includes UTI, fungal skin infection.  Pending urinalysis.  Urinalysis with several white blood cells, possibility of UTI versus fungal skin infection, will treat for both.  Outpatient follow-up recommended      FINAL CLINICAL IMPRESSION(S) / ED DIAGNOSES   Final diagnoses:  Infective urethritis     Rx / DC Orders   ED Discharge Orders          Ordered    cephALEXin  (KEFLEX ) 500 MG capsule  3 times daily        11/16/24 1019    clotrimazole  (CLOTRIMAZOLE   AF) 1 % cream  2 times daily        11/16/24 1019             Note:  This document was prepared using Dragon voice recognition software and may include unintentional dictation errors.   Arlander Charleston, MD 11/16/24 1136  "

## 2024-11-17 ENCOUNTER — Other Ambulatory Visit: Payer: Self-pay

## 2024-11-17 ENCOUNTER — Emergency Department: Admission: EM | Admit: 2024-11-17 | Discharge: 2024-11-17 | Disposition: A | Discharge status: Home / Self Care

## 2024-11-17 NOTE — ED Triage Notes (Signed)
 Pt to ED via POV from home. Pt seen here yesterday for itching with urination and dx with UTI. Pt placed on keflex . Pt reports when taking the medication his heart starts racing and then stops shortly after. Pt reports this only happens after he takes the medicine.   Pt reporting cannot be seen by PCP until 1/6
